# Patient Record
Sex: Female | Born: 1965 | Hispanic: No | Marital: Married | Smoking: Never smoker
Health system: Southern US, Community
[De-identification: ages and names within clinical notes are randomized; demographics above are authoritative.]

## PROBLEM LIST (undated history)

## (undated) DIAGNOSIS — I10 Essential (primary) hypertension: Secondary | ICD-10-CM

## (undated) DIAGNOSIS — N6320 Unspecified lump in the left breast, unspecified quadrant: Secondary | ICD-10-CM

## (undated) DIAGNOSIS — E538 Deficiency of other specified B group vitamins: Secondary | ICD-10-CM

## (undated) DIAGNOSIS — N6092 Unspecified benign mammary dysplasia of left breast: Secondary | ICD-10-CM

## (undated) DIAGNOSIS — E785 Hyperlipidemia, unspecified: Secondary | ICD-10-CM

## (undated) DIAGNOSIS — H409 Unspecified glaucoma: Secondary | ICD-10-CM

## (undated) DIAGNOSIS — M1990 Unspecified osteoarthritis, unspecified site: Secondary | ICD-10-CM

## (undated) DIAGNOSIS — R928 Other abnormal and inconclusive findings on diagnostic imaging of breast: Secondary | ICD-10-CM

## (undated) DIAGNOSIS — K219 Gastro-esophageal reflux disease without esophagitis: Secondary | ICD-10-CM

## (undated) HISTORY — PX: CATARACT EXTRACTION, BILATERAL: SHX1313

## (undated) HISTORY — DX: Unspecified osteoarthritis, unspecified site: M19.90

## (undated) HISTORY — DX: Gastro-esophageal reflux disease without esophagitis: K21.9

## (undated) HISTORY — DX: Unspecified benign mammary dysplasia of left breast: N60.92

## (undated) HISTORY — PX: ABDOMINAL HYSTERECTOMY: SHX81

## (undated) HISTORY — DX: Essential (primary) hypertension: I10

## (undated) HISTORY — DX: Hyperlipidemia, unspecified: E78.5

## (undated) HISTORY — DX: Unspecified glaucoma: H40.9

## (undated) HISTORY — DX: Deficiency of other specified B group vitamins: E53.8

---

## 2015-04-29 ENCOUNTER — Ambulatory Visit: Payer: Self-pay | Attending: Family Medicine

## 2015-06-04 ENCOUNTER — Encounter: Payer: Self-pay | Admitting: Family Medicine

## 2015-06-04 ENCOUNTER — Ambulatory Visit (INDEPENDENT_AMBULATORY_CARE_PROVIDER_SITE_OTHER): Payer: No Typology Code available for payment source | Admitting: Family Medicine

## 2015-06-04 VITALS — BP 124/70 | HR 84 | Temp 98.2°F | Ht 63.0 in | Wt 183.0 lb

## 2015-06-04 DIAGNOSIS — Z1211 Encounter for screening for malignant neoplasm of colon: Secondary | ICD-10-CM

## 2015-06-04 DIAGNOSIS — Z0000 Encounter for general adult medical examination without abnormal findings: Secondary | ICD-10-CM

## 2015-06-04 DIAGNOSIS — I10 Essential (primary) hypertension: Secondary | ICD-10-CM

## 2015-06-04 DIAGNOSIS — H409 Unspecified glaucoma: Secondary | ICD-10-CM

## 2015-06-04 DIAGNOSIS — M25519 Pain in unspecified shoulder: Secondary | ICD-10-CM | POA: Insufficient documentation

## 2015-06-04 DIAGNOSIS — M25511 Pain in right shoulder: Secondary | ICD-10-CM

## 2015-06-04 DIAGNOSIS — Z23 Encounter for immunization: Secondary | ICD-10-CM

## 2015-06-04 LAB — COMPLETE METABOLIC PANEL WITH GFR
ALT: 23 U/L (ref 6–29)
AST: 26 U/L (ref 10–35)
Albumin: 4 g/dL (ref 3.6–5.1)
Alkaline Phosphatase: 85 U/L (ref 33–130)
BUN: 11 mg/dL (ref 7–25)
CHLORIDE: 105 mmol/L (ref 98–110)
CO2: 24 mmol/L (ref 20–31)
Calcium: 9.6 mg/dL (ref 8.6–10.4)
Creat: 0.58 mg/dL (ref 0.50–1.05)
GLUCOSE: 94 mg/dL (ref 65–99)
Potassium: 4.3 mmol/L (ref 3.5–5.3)
SODIUM: 139 mmol/L (ref 135–146)
Total Bilirubin: 0.3 mg/dL (ref 0.2–1.2)
Total Protein: 7 g/dL (ref 6.1–8.1)

## 2015-06-04 LAB — CBC WITH DIFFERENTIAL/PLATELET
BASOS ABS: 0 10*3/uL (ref 0.0–0.1)
Basophils Relative: 0 % (ref 0–1)
Eosinophils Absolute: 0.2 10*3/uL (ref 0.0–0.7)
Eosinophils Relative: 3 % (ref 0–5)
HEMATOCRIT: 37.7 % (ref 36.0–46.0)
HEMOGLOBIN: 12 g/dL (ref 12.0–15.0)
LYMPHS ABS: 1.3 10*3/uL (ref 0.7–4.0)
LYMPHS PCT: 19 % (ref 12–46)
MCH: 26.5 pg (ref 26.0–34.0)
MCHC: 31.8 g/dL (ref 30.0–36.0)
MCV: 83.2 fL (ref 78.0–100.0)
MONOS PCT: 6 % (ref 3–12)
MPV: 11.3 fL (ref 8.6–12.4)
Monocytes Absolute: 0.4 10*3/uL (ref 0.1–1.0)
NEUTROS ABS: 5 10*3/uL (ref 1.7–7.7)
NEUTROS PCT: 72 % (ref 43–77)
PLATELETS: 345 10*3/uL (ref 150–400)
RBC: 4.53 MIL/uL (ref 3.87–5.11)
RDW: 15 % (ref 11.5–15.5)
WBC: 6.9 10*3/uL (ref 4.0–10.5)

## 2015-06-04 LAB — LIPID PANEL
CHOL/HDL RATIO: 4.3 ratio (ref <=5.0)
Cholesterol: 151 mg/dL (ref 125–200)
HDL: 35 mg/dL — AB (ref >=46)
LDL Cholesterol: 93 mg/dL (ref <130)
Triglycerides: 115 mg/dL (ref <150)
VLDL: 23 mg/dL (ref <30)

## 2015-06-04 MED ORDER — OLMESARTAN MEDOXOMIL 20 MG PO TABS: 10.0000 mg | ORAL_TABLET | Freq: Every day | ORAL | Status: DC

## 2015-06-04 MED ORDER — AMLODIPINE BESYLATE 5 MG PO TABS: 2.5000 mg | ORAL_TABLET | Freq: Every day | ORAL | Status: DC

## 2015-06-04 NOTE — Progress Notes (Signed)
Patient ID: Alexandra Beasley, female   DOB: [birth year], 50 y.o.   MRN: [id number]   Alexandra Beasley, is a 50 y.o. female  [id number]  [id number]  DOB - [birth year]  CC:  Chief Complaint  Patient presents with  . Establish Care       HPI: Alexandra Beasley is a 50 y.o. female here to establish care. She has a history of hypertension and is on Benicar 0.5 and amlodipine 5 mg daily. She is needing refills. She has a history of glaucoma and sees an opthalmologist. Her only complaint today is of right upper chest and shoulder pain, which has been bothering her for about a month. She has been taking OTC naproxen occasionally. She is not aware of any specific injury.   Health Maintenance:  She is in need of Tdap and influenza, which she is to receive today. She also needs mammogram and colon cancer screening She has had a hysterectomy about 15 years ago. She is also in need of HIV and Hep C screening.  No Known Allergies Past Medical History  Diagnosis Date  . Hypertension    No current outpatient prescriptions on file prior to visit.   No current facility-administered medications on file prior to visit.   Family History  Problem Relation Age of Onset  . Vision loss Mother   . Hypertension Mother   . Heart disease Father    Social History   Social History  . Marital Status: Married    Spouse Name: N/A  . Number of Children: N/A  . Years of Education: N/A   Occupational History  . Not on file.   Social History Main Topics  . Smoking status: Never Smoker   . Smokeless tobacco: Never Used  . Alcohol Use: No  . Drug Use: No  . Sexual Activity: No   Other Topics Concern  . Not on file   Social History Narrative  . No narrative on file    Review of Systems: Constitutional: Negative for fever, chills, appetite change, weight loss,  Fatigue. Skin: Negative for rashes or lesions of concern. HENT: Negative for ear pain, ear discharge.nose bleeds Eyes: Negative for  pain, discharge, redness, itching and visual disturbance.Glaucoma Neck: Negative for pain, stiffness Respiratory: Negative for cough. Some shortness of breath with stair walking Cardiovascular: Negative for chest pain, palpitations and leg swelling. Gastrointestinal: Negative for abdominal pain, nausea, vomiting, diarrhea, constipations. Positive for some acidity problems Genitourinary: Negative for dysuria, urgency, frequency, hematuria,  Musculoskeletal: Positive for  Low back pain, knee pain and right shoulder pain., joint  swelling, and gait problem.Negative for weakness. Neurological: Negative for dizziness, tremors, seizures, syncope,   light-headedness, numbness and headaches.  Hematological: Negative for easy bruising or bleeding Psychiatric/Behavioral: Negative for depression, anxiety, decreased concentration, confusion   Objective:   Filed Vitals:   06/04/15 1328  BP: 124/70  Pulse: 84  Temp: 98.2 F (36.8 C)    Physical Exam: Constitutional: Patient appears well-developed and well-nourished. No distress. HENT: Normocephalic, atraumatic, External right and left ear normal. Oropharynx is clear and moist.  Eyes: Conjunctivae and EOM are normal. PERRLA, no scleral icterus. Neck: Normal ROM. Neck supple. No lymphadenopathy, No thyromegaly. CVS: RRR, S1/S2 +, no murmurs, no gallops, no rubs Pulmonary: Effort and breath sounds normal, no stridor, rhonchi, wheezes, rales.  Abdominal: Soft. Normoactive BS,, no distension, tenderness, rebound or guarding.  Musculoskeletal: Positive for pain in area of right shoulder with ROM. No edema and no tenderness.  Neuro: Alert.Normal muscle tone coordination.  Non-focal Skin: Skin is warm and dry. No rash noted. Not diaphoretic. No erythema. No pallor. Psychiatric: Normal mood and affect. Behavior, judgment, thought content normal.  No results found for: WBC, HGB, HCT, MCV, PLT No results found for: CREATININE, BUN, NA, K, CL, CO2  No  results found for: HGBA1C Lipid Panel  No results found for: CHOL, TRIG, HDL, CHOLHDL, VLDL, LDLCALC     Assessment and plan:   1. Need for Tdap vaccination  - Tdap vaccine greater than or equal to 7yo IM  2. Need for prophylactic vaccination and inoculation against influenza  - Flu Vaccine QUAD 36+ mos PF IM (Fluarix & Fluzone Quad PF)  3. Colon cancer screening  - POC Hemoccult Bld/Stl (3-Cd Home Screen); Future  4. Essential hypertension  - olmesartan (BENICAR) 20 MG tablet; Take 0.5 tablets (10 mg total) by mouth daily.  Dispense: 90 tablet; Refill: 1 - amLODipine (NORVASC) 5 MG tablet; Take 0.5 tablets (2.5 mg total) by mouth daily.  Dispense: 90 tablet; Refill: 1 - COMPLETE METABOLIC PANEL WITH GFR - CBC with Differential - Lipid panel  5. Health care maintenance  - HIV antibody (with reflex) - Hepatitis C antibody; Standing - MM Digital Screening; Future - Hepatitis C antibody   Return in about 6 months (around 12/05/2015).  The patient was given clear instructions to go to ER or return to medical center if symptoms don't improve, worsen or new problems develop. The patient verbalized understanding.    Micheline Chapman FNP  06/04/2015, 2:12 PM

## 2015-06-04 NOTE — Patient Instructions (Signed)
We will let you know if anything in bloodwork needs attention Call [phone] to schedule a mammogram for breast cancer screening Return stool cards to [place] to be screened for colon cancer.

## 2015-06-05 LAB — HIV ANTIBODY (ROUTINE TESTING W REFLEX): HIV: NONREACTIVE

## 2015-06-08 ENCOUNTER — Other Ambulatory Visit: Payer: Self-pay | Admitting: Family Medicine

## 2015-06-08 ENCOUNTER — Other Ambulatory Visit (INDEPENDENT_AMBULATORY_CARE_PROVIDER_SITE_OTHER): Payer: No Typology Code available for payment source

## 2015-06-08 DIAGNOSIS — Z1231 Encounter for screening mammogram for malignant neoplasm of breast: Secondary | ICD-10-CM

## 2015-06-08 DIAGNOSIS — Z1211 Encounter for screening for malignant neoplasm of colon: Secondary | ICD-10-CM

## 2015-06-08 LAB — POC HEMOCCULT BLD/STL (HOME/3-CARD/SCREEN)
Card #2 Fecal Occult Blod, POC: NEGATIVE
FECAL OCCULT BLD: NEGATIVE
FECAL OCCULT BLD: NEGATIVE

## 2015-06-09 ENCOUNTER — Ambulatory Visit
Admission: RE | Admit: 2015-06-09 | Discharge: 2015-06-09 | Disposition: A | Discharge status: Home / Self Care | Payer: No Typology Code available for payment source | Source: Ambulatory Visit | Attending: Family Medicine | Admitting: Family Medicine

## 2015-06-09 DIAGNOSIS — Z1231 Encounter for screening mammogram for malignant neoplasm of breast: Secondary | ICD-10-CM

## 2015-06-17 ENCOUNTER — Telehealth: Payer: Self-pay

## 2015-06-17 NOTE — Telephone Encounter (Signed)
Vaughan Basta can you please advise about medications, pharmacy is saying medications can not be split. Can you prescribe a different dose or medication? Please have your nurse give patient a call back on Friday. Thanks!

## 2015-06-22 NOTE — Telephone Encounter (Signed)
Alexandra Beasley will you please have Vaughan Basta address by the end of the day. Thanks!

## 2015-06-22 NOTE — Telephone Encounter (Signed)
I spoke at length with patients daughter, they have 3 issues, the first is both B/P meds need dose adjustments. The current dose of Benicar 20mg  1/2 tablet and Norvasc 5mg  1/2 tablet were given in [place]. The pharmacy here says they can not cut either of these tablets, so they need you to decide what dose of each she needs daily. Once decided it needs to be sent to [facility]. Patient also needs referral to Opthalmology due to dx of glaucoma. She only has the orange card for insurance purposes. Thanks!

## 2015-06-25 ENCOUNTER — Other Ambulatory Visit: Payer: Self-pay | Admitting: Family Medicine

## 2015-06-25 DIAGNOSIS — H409 Unspecified glaucoma: Secondary | ICD-10-CM

## 2015-06-25 NOTE — Telephone Encounter (Signed)
Called and spoke with Son ( received permission from mom since she doesn't speak english well) Advised him to have her STOP benicar and only take 1 tablet of amlodipine daily. He verbalized understanding and and said his mom would like to call back after she finds out her schedule to schedule a bp check with [place]. Thanks!

## 2015-06-25 NOTE — Telephone Encounter (Signed)
I would recommend stopping the benicar and taking the whole 5 mg of amlodipine and have BP in two weeks.

## 2015-06-26 ENCOUNTER — Ambulatory Visit: Payer: No Typology Code available for payment source

## 2015-06-26 ENCOUNTER — Other Ambulatory Visit: Payer: Self-pay

## 2015-06-26 DIAGNOSIS — I10 Essential (primary) hypertension: Secondary | ICD-10-CM

## 2015-06-26 MED ORDER — AMLODIPINE BESYLATE 5 MG PO TABS: 2.5000 mg | ORAL_TABLET | Freq: Every day | ORAL | Status: DC

## 2015-06-26 MED ORDER — OLMESARTAN MEDOXOMIL 20 MG PO TABS: 10.0000 mg | ORAL_TABLET | Freq: Every day | ORAL | Status: DC

## 2015-06-26 NOTE — Progress Notes (Unsigned)
Patient ID: Alexandra Beasley, female   DOB: 12-25-1965, 50 y.o.   MRN: [id number] Pt arrived for BP check; BP 127/67; NP notified; pt to continue home regimen as prescribed

## 2015-06-29 ENCOUNTER — Other Ambulatory Visit: Payer: Self-pay | Admitting: Family Medicine

## 2015-06-29 MED ORDER — LISINOPRIL-HYDROCHLOROTHIAZIDE 20-25 MG PO TABS: 1.0000 | ORAL_TABLET | Freq: Every day | ORAL | Status: DC

## 2015-06-29 MED FILL — LISINOPRIL-HCTZ 20-25 MG TA: 20-25 | 30 days supply | Qty: 30 | Fill #0

## 2015-07-13 ENCOUNTER — Other Ambulatory Visit: Payer: Self-pay | Admitting: Family Medicine

## 2015-07-13 ENCOUNTER — Ambulatory Visit: Payer: No Typology Code available for payment source | Admitting: Family Medicine

## 2015-07-13 DIAGNOSIS — H409 Unspecified glaucoma: Secondary | ICD-10-CM

## 2015-07-27 MED FILL — LISINOPRIL-HCTZ 20-25 MG TA: 20-25 | 30 days supply | Qty: 30 | Fill #1

## 2015-10-28 ENCOUNTER — Ambulatory Visit: Payer: No Typology Code available for payment source | Attending: Internal Medicine

## 2015-12-01 ENCOUNTER — Ambulatory Visit: Payer: No Typology Code available for payment source | Admitting: Family Medicine

## 2015-12-11 ENCOUNTER — Ambulatory Visit: Payer: No Typology Code available for payment source | Admitting: Family Medicine

## 2015-12-28 ENCOUNTER — Ambulatory Visit (INDEPENDENT_AMBULATORY_CARE_PROVIDER_SITE_OTHER): Payer: No Typology Code available for payment source | Admitting: Family Medicine

## 2015-12-28 ENCOUNTER — Encounter: Payer: Self-pay | Admitting: Family Medicine

## 2015-12-28 VITALS — BP 124/71 | HR 80 | Temp 98.3°F | Resp 12 | Ht 62.0 in | Wt 182.0 lb

## 2015-12-28 DIAGNOSIS — I10 Essential (primary) hypertension: Secondary | ICD-10-CM

## 2015-12-28 DIAGNOSIS — H409 Unspecified glaucoma: Secondary | ICD-10-CM

## 2015-12-28 DIAGNOSIS — Z23 Encounter for immunization: Secondary | ICD-10-CM

## 2015-12-28 DIAGNOSIS — S025XXA Fracture of tooth (traumatic), initial encounter for closed fracture: Secondary | ICD-10-CM

## 2015-12-28 DIAGNOSIS — Z131 Encounter for screening for diabetes mellitus: Secondary | ICD-10-CM

## 2015-12-28 LAB — CBC WITH DIFFERENTIAL/PLATELET
BASOS PCT: 1 %
Basophils Absolute: 66 cells/uL (ref 0–200)
EOS ABS: 132 {cells}/uL (ref 15–500)
Eosinophils Relative: 2 %
HCT: 36.2 % (ref 35.0–45.0)
Hemoglobin: 11.6 g/dL — ABNORMAL LOW (ref 11.7–15.5)
Lymphocytes Relative: 20 %
Lymphs Abs: 1320 cells/uL (ref 850–3900)
MCH: 26.5 pg — ABNORMAL LOW (ref 27.0–33.0)
MCHC: 32 g/dL (ref 32.0–36.0)
MCV: 82.6 fL (ref 80.0–100.0)
MONO ABS: 396 {cells}/uL (ref 200–950)
MONOS PCT: 6 %
MPV: 10.2 fL (ref 7.5–12.5)
NEUTROS ABS: 4686 {cells}/uL (ref 1500–7800)
Neutrophils Relative %: 71 %
PLATELETS: 298 10*3/uL (ref 140–400)
RBC: 4.38 MIL/uL (ref 3.80–5.10)
RDW: 14.4 % (ref 11.0–15.0)
WBC: 6.6 10*3/uL (ref 3.8–10.8)

## 2015-12-28 LAB — COMPLETE METABOLIC PANEL WITH GFR
ALT: 24 U/L (ref 6–29)
AST: 23 U/L (ref 10–35)
Albumin: 3.8 g/dL (ref 3.6–5.1)
Alkaline Phosphatase: 75 U/L (ref 33–130)
BILIRUBIN TOTAL: 0.4 mg/dL (ref 0.2–1.2)
BUN: 11 mg/dL (ref 7–25)
CO2: 21 mmol/L (ref 20–31)
CREATININE: 0.6 mg/dL (ref 0.50–1.05)
Calcium: 9.4 mg/dL (ref 8.6–10.4)
Chloride: 109 mmol/L (ref 98–110)
GFR, Est Non African American: >89 mL/min (ref >=60)
GLUCOSE: 103 mg/dL — AB (ref 65–99)
Potassium: 4.1 mmol/L (ref 3.5–5.3)
Sodium: 137 mmol/L (ref 135–146)
TOTAL PROTEIN: 6.8 g/dL (ref 6.1–8.1)

## 2015-12-28 LAB — LIPID PANEL
Cholesterol: 140 mg/dL (ref 125–200)
HDL: 30 mg/dL — ABNORMAL LOW (ref >=46)
LDL CALC: 88 mg/dL (ref <130)
TRIGLYCERIDES: 112 mg/dL (ref <150)
Total CHOL/HDL Ratio: 4.7 Ratio (ref <=5.0)
VLDL: 22 mg/dL (ref <30)

## 2015-12-28 MED ORDER — LISINOPRIL-HYDROCHLOROTHIAZIDE 20-25 MG PO TABS
1.0000 | ORAL_TABLET | Freq: Every day | ORAL | 1 refills | Status: DC
Start: 2015-12-28 — End: 2017-05-12

## 2015-12-28 NOTE — Progress Notes (Signed)
Alexandra Beasley, is a 50 y.o. female  [id number]  [id number]  DOB - 01-08-1966  CC:  Chief Complaint  Patient presents with  . follow up    needs eye referral for glaucoma follow up  . Dental Pain    has broken tooth, would like referral  . Shoulder Pain    left upper back and shoulder pain, comes and goes, doesnt know why       HPI: Alexandra Beasley is a 50 y.o. female here for follow-up hypertension. She is requesting a dental referral for a broken tooth and a referral to opth.for glaucoma. She also complains of left upper back and shoulder pain that comes a goes. She reports taking no medications for this. Otherwise she reports feeling well.  Health Maintenanace: She is in need of a PAP smear. She has had a recent mammogram. She will receive flu shot today. She has has recent negative stool cards.  No Known Allergies Past Medical History:  Diagnosis Date  . Glaucoma   . Hypertension    Current Outpatient Prescriptions on File Prior to Visit  Medication Sig Dispense Refill  . latanoprost (XALATAN) 0.005 % ophthalmic solution Place 1 drop into both eyes daily.     No current facility-administered medications on file prior to visit.    Family History  Problem Relation Age of Onset  . Vision loss Mother   . Hypertension Mother   . Heart disease Father    Social History   Social History  . Marital status: Married    Spouse name: N/A  . Number of children: N/A  . Years of education: N/A   Occupational History  . Not on file.   Social History Main Topics  . Smoking status: Never Smoker  . Smokeless tobacco: Never Used  . Alcohol use No  . Drug use: No  . Sexual activity: No   Other Topics Concern  . Not on file   Social History Narrative  . No narrative on file    Review of Systems: Constitutional: Negative Skin: Negative HENT: + for dental issues Eyes: + for glaucoma Neck: Negative Respiratory: Negative Cardiovascular:  Negative Gastrointestinal: Negative Genitourinary: Negative  Musculoskeletal: + for left upper back and shoulder pain and for bilateral knee pain Neurological: Negative  Hematological: Negative  Psychiatric/Behavioral: Negative    Objective:   Vitals:   12/28/15 1015  BP: 124/71  Pulse: 80  Resp: 12  Temp: 98.3 F (36.8 C)    Physical Exam: Constitutional: Patient appears well-developed and well-nourished. No distress. HENT: Normocephalic, atraumatic, External right and left ear normal. Oropharynx is clear and moist. Teeth in poor repair Eyes: Conjunctivae and EOM are normal. PERRLA, no scleral icterus. Neck: Normal ROM. Neck supple. No lymphadenopathy, No thyromegaly. CVS: RRR, S1/S2 +, no murmurs, no gallops, no rubs Pulmonary: Effort and breath sounds normal, no stridor, rhonchi, wheezes, rales.  Abdominal: Soft. Normoactive BS,, no distension, tenderness, rebound or guarding.  Musculoskeletal: Normal range of motion. No edema and no tenderness.  Neuro: Alert.Normal muscle tone coordination. Non-focal Skin: Skin is warm and dry. No rash noted. Not diaphoretic. No erythema. No pallor. Psychiatric: Normal mood and affect. Behavior, judgment, thought content normal.  Lab Results  Component Value Date   WBC 6.9 06/04/2015   HGB 12.0 06/04/2015   HCT 37.7 06/04/2015   MCV 83.2 06/04/2015   PLT 345 06/04/2015   Lab Results  Component Value Date   CREATININE 0.58 06/04/2015   BUN 11 06/04/2015  NA 139 06/04/2015   K 4.3 06/04/2015   CL 105 06/04/2015   CO2 24 06/04/2015    No results found for: HGBA1C Lipid Panel     Component Value Date/Time   CHOL 151 06/04/2015 1413   TRIG 115 06/04/2015 1413   HDL 35 (L) 06/04/2015 1413   CHOLHDL 4.3 06/04/2015 1413   VLDL 23 06/04/2015 1413   LDLCALC 93 06/04/2015 1413       Assessment and plan:   1. Screening for diabetes mellitus  - Hemoglobin A1c  2. Essential hypertension  - CBC with Differential -  COMPLETE METABOLIC PANEL WITH GFR - Lipid panel  3. Glaucoma, unspecified glaucoma type, unspecified laterality  - Ambulatory referral to Ophthalmology  4. Closed fracture of tooth, initial encounter  - Ambulatory referral to Dentistry  5. Need for prophylactic vaccination and inoculation against influenza  - Flu Vaccine QUAD 36+ mos PF IM (Fluarix & Fluzone Quad PF)   Return in about 6 months (around 06/27/2016).  The patient was given clear instructions to go to ER or return to medical center if symptoms don't improve, worsen or new problems develop. The patient verbalized understanding.    Micheline Chapman FNP  12/28/2015, 12:06 PM

## 2015-12-28 NOTE — Patient Instructions (Addendum)
Continue current medications May take over the counter pain medications. Have put in referrals for eye doctor and dentist

## 2015-12-29 LAB — HEMOGLOBIN A1C
HEMOGLOBIN A1C: 5.8 % — AB (ref <5.7)
MEAN PLASMA GLUCOSE: 120 mg/dL

## 2016-02-01 ENCOUNTER — Emergency Department (HOSPITAL_COMMUNITY): Payer: Worker's Compensation

## 2016-02-01 ENCOUNTER — Encounter (HOSPITAL_COMMUNITY): Payer: Self-pay | Admitting: *Deleted

## 2016-02-01 ENCOUNTER — Emergency Department (HOSPITAL_COMMUNITY)
Admission: EM | Admit: 2016-02-01 | Discharge: 2016-02-01 | Disposition: A | Discharge status: Home / Self Care | Payer: Worker's Compensation | Attending: Emergency Medicine | Admitting: Emergency Medicine

## 2016-02-01 DIAGNOSIS — Y92000 Kitchen of unspecified non-institutional (private) residence as  the place of occurrence of the external cause: Secondary | ICD-10-CM | POA: Diagnosis not present

## 2016-02-01 DIAGNOSIS — W19XXXA Unspecified fall, initial encounter: Secondary | ICD-10-CM

## 2016-02-01 DIAGNOSIS — M1711 Unilateral primary osteoarthritis, right knee: Secondary | ICD-10-CM | POA: Insufficient documentation

## 2016-02-01 DIAGNOSIS — Y990 Civilian activity done for income or pay: Secondary | ICD-10-CM | POA: Diagnosis not present

## 2016-02-01 DIAGNOSIS — Y939 Activity, unspecified: Secondary | ICD-10-CM | POA: Diagnosis not present

## 2016-02-01 DIAGNOSIS — S8391XA Sprain of unspecified site of right knee, initial encounter: Secondary | ICD-10-CM | POA: Diagnosis not present

## 2016-02-01 DIAGNOSIS — W010XXA Fall on same level from slipping, tripping and stumbling without subsequent striking against object, initial encounter: Secondary | ICD-10-CM | POA: Insufficient documentation

## 2016-02-01 DIAGNOSIS — S60222A Contusion of left hand, initial encounter: Secondary | ICD-10-CM | POA: Diagnosis not present

## 2016-02-01 DIAGNOSIS — I10 Essential (primary) hypertension: Secondary | ICD-10-CM | POA: Diagnosis not present

## 2016-02-01 DIAGNOSIS — S6992XA Unspecified injury of left wrist, hand and finger(s), initial encounter: Secondary | ICD-10-CM | POA: Diagnosis present

## 2016-02-01 MED ORDER — HYDROCODONE-ACETAMINOPHEN 5-325 MG PO TABS: 1.0000 | ORAL_TABLET | Freq: Four times a day (QID) | ORAL | 0 refills | Status: DC | PRN

## 2016-02-01 MED ORDER — OXYCODONE-ACETAMINOPHEN 5-325 MG PO TABS
1.0000 | ORAL_TABLET | ORAL | Status: DC | PRN
  Administered 2016-02-01: 1 via ORAL
  Filled 2016-02-01: qty 1

## 2016-02-01 MED ORDER — NAPROXEN 500 MG PO TABS: 500.0000 mg | ORAL_TABLET | Freq: Two times a day (BID) | ORAL | 0 refills | Status: DC | PRN

## 2016-02-01 NOTE — ED Provider Notes (Signed)
[facility] DEPT Provider Note   CSN: [id number] Arrival date & time: 02/01/16  1637  By signing my name below, I, Alexandra Beasley, attest that this documentation has been prepared under the direction and in the presence of Sharayah Renfrow Camprubi-Soms, PA-C. Electronically Signed: Rayna Beasley, ED Scribe. 02/01/16. 5:28 PM.   History   Chief Complaint Chief Complaint  Patient presents with  . Wrist Pain  . Knee Pain    HPI HPI Comments: Alexandra Beasley is a 50 y.o. female who presents to the Emergency Department complaining of a mechanical fall that occurred PTA. Pt slipped on a wet kitchen surface at her work and landed on her left wrist and right knee. She reports associated 9/10, constant, throbbing, non radiating right knee and left hand pain that worsens with weight bearing and gripping her L hand, and with no tx tried PTA. Associated symptoms includes mild swelling in R knee, and bruising to R knee that has since improved/resolved. She was unable to ambulate after the fall without significant discomfort, causing her to limp on the R leg due to pain. She denies head trauma, LOC, any other regions of pain, other injuries sustained, focal weakness, numbness, or tingling. No abrasions/lacerations.   The history is provided by the patient and a relative. No language interpreter was used.  Wrist Pain  This is a new problem. The current episode started 1 to 2 hours ago. The problem occurs constantly. The problem has not changed since onset.The symptoms are aggravated by walking, bending and twisting. Nothing relieves the symptoms. She has tried nothing for the symptoms. The treatment provided no relief.  Knee Pain   Pertinent negatives include no numbness.    Past Medical History:  Diagnosis Date  . Glaucoma   . Hypertension     Patient Active Problem List   Diagnosis Date Noted  . Glaucoma 06/04/2015  . Pain in joint, shoulder region 06/04/2015    Past Surgical History:    Procedure Laterality Date  . ABDOMINAL HYSTERECTOMY      OB History    No data available     Home Medications    Prior to Admission medications   Medication Sig Start Date End Date Taking? Authorizing Provider  latanoprost (XALATAN) 0.005 % ophthalmic solution Place 1 drop into both eyes daily.    Historical Provider, MD  lisinopril-hydrochlorothiazide (PRINZIDE,ZESTORETIC) 20-25 MG tablet Take 1 tablet by mouth daily. 12/28/15   Micheline Chapman, NP    Family History Family History  Problem Relation Age of Onset  . Vision loss Mother   . Hypertension Mother   . Heart disease Father     Social History Social History  Substance Use Topics  . Smoking status: Never Smoker  . Smokeless tobacco: Never Used  . Alcohol use No     Allergies   Patient has no known allergies.  Review of Systems Review of Systems  HENT: Negative for facial swelling (no head inj).   Musculoskeletal: Positive for arthralgias (R knee, L hand) and joint swelling (R knee).  Skin: Positive for color change (bruise earlier on R knee, resolved). Negative for wound.  Allergic/Immunologic: Negative for immunocompromised state.  Neurological: Negative for syncope, weakness and numbness.  Psychiatric/Behavioral: Negative for confusion.  10 Systems reviewed and are negative for acute change except as noted in the HPI.   Physical Exam Updated Vital Signs BP 126/69 (BP Location: Right Arm)   Pulse 91   Temp 98.4 F (36.9 C) (Oral)   Resp 16  SpO2 98%   Physical Exam  Constitutional: She is oriented to person, place, and time. Vital signs are normal. She appears well-developed and well-nourished.  Non-toxic appearance. No distress.  Afebrile, nontoxic, NAD  HENT:  Head: Normocephalic and atraumatic.  Mouth/Throat: Mucous membranes are normal.  Eyes: Conjunctivae and EOM are normal. Right eye exhibits no discharge. Left eye exhibits no discharge.  Neck: Normal range of motion. Neck supple.   Cardiovascular: Normal rate and intact distal pulses.   Pulmonary/Chest: Effort normal. No respiratory distress.  Abdominal: Normal appearance. She exhibits no distension.  Musculoskeletal:       Right knee: She exhibits decreased range of motion (due to pain), swelling and effusion. She exhibits no ecchymosis, no deformity, no laceration, no erythema, normal alignment, no LCL laxity, normal patellar mobility and no MCL laxity. Tenderness found. Medial joint line and lateral joint line tenderness noted.       Left hand: She exhibits tenderness, bony tenderness and swelling. She exhibits normal range of motion, normal capillary refill, no deformity and no laceration. Normal sensation noted. Normal strength noted.  Right knee with mildly limited ROM due to pain, with diffuse joint line TTP but no calf or thigh tenderness, +swelling/effusion no deformity, no bruising or erythema, no warmth, no abnormal alignment or patellar mobility, no varus/valgus laxity, neg anterior drawer test, no crepitus.  Left hand with mild TTP to the second metacarpal region, FROM intact in all digits and at wrist, no focal TTP at the wrist, with mild swelling near the second MCP joint, skin intact, no bruising, no deformity or crepitus.  Strength and sensation grossly intact in all extremities, distal pulses intact, compartments soft   Neurological: She is alert and oriented to person, place, and time. She has normal strength. No sensory deficit.  Skin: Skin is warm, dry and intact. No rash noted.  Psychiatric: She has a normal mood and affect. Her behavior is normal.  Nursing note and vitals reviewed.  ED Treatments / Results  Labs (all labs ordered are listed, but only abnormal results are displayed) Labs Reviewed - No data to display  EKG  EKG Interpretation None       Radiology Dg Wrist Complete Left  Result Date: 02/01/2016 CLINICAL DATA:  LEFT wrist pain all over since falling at work today EXAM: LEFT  WRIST - COMPLETE 3+ VIEW COMPARISON:  None FINDINGS: Osseous mineralization normal. Joint spaces preserved. No fracture, dislocation, or bone destruction. IMPRESSION: Normal exam. Electronically Signed   By: Lavonia Dana M.D.   On: 02/01/2016 17:25   Dg Knee Complete 4 Views Right  Result Date: 02/01/2016 CLINICAL DATA:  50 year old female with knee pain after falling at work. Initial encounter. EXAM: RIGHT KNEE - COMPLETE 4+ VIEW COMPARISON:  None. FINDINGS: Lateral and patellofemoral subchondral mixed lucency and sclerosis, and degenerative appearing osteophytosis. The medial compartment has a more normal appearance, aside from mild degenerative spurring. The patella appears intact. No joint effusion identified. No acute osseous abnormality identified. IMPRESSION: No acute fracture or dislocation identified about the right knee. Age advanced lateral and patellofemoral compartment degeneration. Electronically Signed   By: Genevie Ann M.D.   On: 02/01/2016 17:25   Dg Hand Complete Left  Result Date: 02/01/2016 CLINICAL DATA:  Left hand pain.  Left second metacarpal pain. EXAM: LEFT HAND - COMPLETE 3+ VIEW COMPARISON:  Left wrist radiograph same day FINDINGS: There is no evidence of fracture or dislocation. There is no evidence of arthropathy or other focal bone abnormality. Soft  tissues are unremarkable. IMPRESSION: No acute fracture or dislocation of the left hand. Electronically Signed   By: Ulyses Jarred M.D.   On: 02/01/2016 17:47    Procedures Procedures  DIAGNOSTIC STUDIES: Oxygen Saturation is 98% on RA, normal by my interpretation.    COORDINATION OF CARE: 5:27 PM Discussed next steps with pt. Pt verbalized understanding and is agreeable with the plan.    Medications Ordered in ED Medications  oxyCODONE-acetaminophen (PERCOCET/ROXICET) 5-325 MG per tablet 1 tablet (1 tablet Oral Given 02/01/16 1718)   Initial Impression / Assessment and Plan / ED Course  I have reviewed the triage vital  signs and the nursing notes.  Pertinent labs & imaging results that were available during my care of the patient were reviewed by me and considered in my medical decision making (see chart for details).  Clinical Course     50 y.o. female here with L hand and R knee injury s/p slip and fall at work. R knee with loss of ROM due to pain, moderate effusion/swelling, moderate TTP throughout joint, no bruising or crepitus; L hand with mild TTP to 2nd MCP joint area, FROM intact, strength grossly intact; NVI with soft compartments in all extremities. No other injuries sustained. Pain meds given in triage, xray of L wrist done but this doesn't include area of tenderness so will get L hand xray. Awaiting R knee xray, but may need to get CT to r/o tibial plateau fx given how tender she is. Will see what xray says, if negative and unable to ambulate pt, will need to get CT. Will reassess shortly  6:11 PM XRays all negative aside from advanced degenerative changes of R knee joint. Pt ambulated without difficulty, so less likely to be occult tibial plateau fx. Doubt need for emergent CT imaging at this time, especially given the arthritis seen on xray-- that's likely why her pain and swelling was more notable. Will apply knee sleeve, give wrist splint, and crutches for comfort. F/up with ortho before she leaves the country in 1wk, for ongoing eval and management of knee arthritis and recheck of today's symptoms. RICE discussed, pain meds given. I explained the diagnosis and have given explicit precautions to return to the ER including for any other new or worsening symptoms. The patient understands and accepts the medical plan as it's been dictated and I have answered their questions. Discharge instructions concerning home care and prescriptions have been given. The patient is STABLE and is discharged to home in good condition.   I personally performed the services described in this documentation, which was scribed  in my presence. The recorded information has been reviewed and is accurate.   Final Clinical Impressions(s) / ED Diagnoses   Final diagnoses:  Contusion of left hand, initial encounter  Sprain of right knee, unspecified ligament, initial encounter  Osteoarthritis of right knee, unspecified osteoarthritis type  Fall, initial encounter    New Prescriptions New Prescriptions   HYDROCODONE-ACETAMINOPHEN (NORCO) 5-325 MG TABLET    Take 1 tablet by mouth every 6 (six) hours as needed for severe pain.   NAPROXEN (NAPROSYN) 500 MG TABLET    Take 1 tablet (500 mg total) by mouth 2 (two) times daily as needed for mild pain, moderate pain or headache (TAKE WITH MEALS.).     Jerritt Cardoza Camprubi-Soms, PA-C 02/01/16 [place], MD 02/04/16 [phone]

## 2016-02-01 NOTE — ED Triage Notes (Signed)
Pt complains of left wrist and right knee pain since falling at work today. Pt states she slipped on the wet floor and landed on her knees and arms.

## 2016-02-01 NOTE — Discharge Instructions (Signed)
Wear knee sleeve for at least 2 weeks for stabilization of knee. Use crutches as needed for comfort. Use wrist splint as needed for comfort. Ice and elevate knee and hand throughout the day, using ice pack for no more than 20 minutes every hour. Alternate between naprosyn and norco for pain relief. Do not drive or operate machinery with pain medication use. Call orthopedic follow up today or tomorrow to schedule followup appointment for recheck of ongoing pain in 3-5 days, and for ongoing management of your knee arthritis. Return to the ER for changes or worsening symptoms.

## 2016-03-21 HISTORY — PX: BREAST EXCISIONAL BIOPSY: SUR124

## 2016-05-09 ENCOUNTER — Ambulatory Visit: Payer: Self-pay

## 2016-05-11 ENCOUNTER — Ambulatory Visit: Payer: Self-pay | Attending: Internal Medicine

## 2016-06-08 ENCOUNTER — Other Ambulatory Visit: Payer: Self-pay | Admitting: Family Medicine

## 2016-06-08 DIAGNOSIS — Z1231 Encounter for screening mammogram for malignant neoplasm of breast: Secondary | ICD-10-CM

## 2016-06-27 ENCOUNTER — Ambulatory Visit
Admission: RE | Admit: 2016-06-27 | Discharge: 2016-06-27 | Disposition: A | Discharge status: Home / Self Care | Payer: No Typology Code available for payment source | Source: Ambulatory Visit | Attending: Family Medicine | Admitting: Family Medicine

## 2016-06-27 DIAGNOSIS — Z1231 Encounter for screening mammogram for malignant neoplasm of breast: Secondary | ICD-10-CM

## 2016-06-29 ENCOUNTER — Other Ambulatory Visit: Payer: Self-pay | Admitting: Obstetrics and Gynecology

## 2016-06-29 DIAGNOSIS — R928 Other abnormal and inconclusive findings on diagnostic imaging of breast: Secondary | ICD-10-CM

## 2016-07-04 ENCOUNTER — Ambulatory Visit (INDEPENDENT_AMBULATORY_CARE_PROVIDER_SITE_OTHER): Payer: Self-pay | Admitting: Family Medicine

## 2016-07-04 ENCOUNTER — Encounter: Payer: Self-pay | Admitting: Family Medicine

## 2016-07-04 VITALS — BP 134/70 | HR 74 | Temp 98.1°F | Resp 16 | Ht 62.0 in | Wt 182.0 lb

## 2016-07-04 DIAGNOSIS — Z1329 Encounter for screening for other suspected endocrine disorder: Secondary | ICD-10-CM

## 2016-07-04 DIAGNOSIS — R7303 Prediabetes: Secondary | ICD-10-CM

## 2016-07-04 DIAGNOSIS — R8290 Unspecified abnormal findings in urine: Secondary | ICD-10-CM

## 2016-07-04 DIAGNOSIS — H539 Unspecified visual disturbance: Secondary | ICD-10-CM

## 2016-07-04 DIAGNOSIS — Z1211 Encounter for screening for malignant neoplasm of colon: Secondary | ICD-10-CM

## 2016-07-04 DIAGNOSIS — G5602 Carpal tunnel syndrome, left upper limb: Secondary | ICD-10-CM

## 2016-07-04 LAB — COMPLETE METABOLIC PANEL WITH GFR
ALBUMIN: 3.8 g/dL (ref 3.6–5.1)
ALK PHOS: 77 U/L (ref 33–130)
ALT: 21 U/L (ref 6–29)
AST: 25 U/L (ref 10–35)
BUN: 11 mg/dL (ref 7–25)
CHLORIDE: 107 mmol/L (ref 98–110)
CO2: 24 mmol/L (ref 20–31)
Calcium: 9.3 mg/dL (ref 8.6–10.4)
Creat: 0.47 mg/dL — ABNORMAL LOW (ref 0.50–1.05)
GFR, Est African American: >89 mL/min (ref >=60)
GFR, Est Non African American: >89 mL/min (ref >=60)
GLUCOSE: 103 mg/dL — AB (ref 65–99)
POTASSIUM: 4.2 mmol/L (ref 3.5–5.3)
SODIUM: 139 mmol/L (ref 135–146)
Total Bilirubin: 0.3 mg/dL (ref 0.2–1.2)
Total Protein: 6.8 g/dL (ref 6.1–8.1)

## 2016-07-04 LAB — POCT URINALYSIS DIP (DEVICE)
BILIRUBIN URINE: NEGATIVE
Glucose, UA: NEGATIVE mg/dL
HGB URINE DIPSTICK: NEGATIVE
KETONES UR: NEGATIVE mg/dL
LEUKOCYTES UA: NEGATIVE
NITRITE: NEGATIVE
PH: 6 (ref 5.0–8.0)
Protein, ur: NEGATIVE mg/dL
Specific Gravity, Urine: 1.025 (ref 1.005–1.030)
Urobilinogen, UA: 0.2 mg/dL (ref 0.0–1.0)

## 2016-07-04 LAB — CBC WITH DIFFERENTIAL/PLATELET
BASOS ABS: 0 {cells}/uL (ref 0–200)
Basophils Relative: 0 %
EOS ABS: 189 {cells}/uL (ref 15–500)
Eosinophils Relative: 3 %
HEMATOCRIT: 35.9 % (ref 35.0–45.0)
HEMOGLOBIN: 11.4 g/dL — AB (ref 11.7–15.5)
LYMPHS ABS: 1323 {cells}/uL (ref 850–3900)
Lymphocytes Relative: 21 %
MCH: 26.1 pg — ABNORMAL LOW (ref 27.0–33.0)
MCHC: 31.8 g/dL — ABNORMAL LOW (ref 32.0–36.0)
MCV: 82.3 fL (ref 80.0–100.0)
MONO ABS: 378 {cells}/uL (ref 200–950)
MPV: 11.4 fL (ref 7.5–12.5)
Monocytes Relative: 6 %
NEUTROS ABS: 4410 {cells}/uL (ref 1500–7800)
Neutrophils Relative %: 70 %
Platelets: 306 10*3/uL (ref 140–400)
RBC: 4.36 MIL/uL (ref 3.80–5.10)
RDW: 15.2 % — ABNORMAL HIGH (ref 11.0–15.0)
WBC: 6.3 10*3/uL (ref 3.8–10.8)

## 2016-07-04 MED ORDER — GABAPENTIN 300 MG PO CAPS: 300.0000 mg | ORAL_CAPSULE | Freq: Two times a day (BID) | ORAL | 0 refills | Status: DC | PRN

## 2016-07-04 MED ORDER — IBUPROFEN 600 MG PO TABS: 600.0000 mg | ORAL_TABLET | Freq: Three times a day (TID) | ORAL | 0 refills | Status: DC | PRN

## 2016-07-04 MED FILL — IBUPROFEN 600 MG TABLET: 600 | 10 days supply | Qty: 30 | Fill #0

## 2016-07-04 MED FILL — GABAPENTIN 300 MG CAPSULE: 300 | 30 days supply | Qty: 60 | Fill #0

## 2016-07-04 NOTE — Patient Instructions (Signed)
For shoulder and wrist pain , this likely a result of carpal tunnel syndrome  Take Ibuprofen 600 mg, twice daily as needed with food.  For pain, take Gabapentin 300 mg twice daily as needed for shoulder pain.   Go to [facility] and purchase a wrist splint and wear every night to prevent pain associated with carpal tunnel.    ???? ?? ???? ?? ???? ?? ???, ?? ?????? ?????? ??? ???????? ?? ?????? ??  ??????????? 600 ????????? ???, ?? ??? ???? ???? ?? ??? ????? ???  ???? ?? ???, ???? ?? ???? ?? ??? ????? ????? ??? ?? ????? ??? ?? ??? 300 ??????????? ????  ???????? ?? ???? ?? ???? ?? ?????? ?? ?????? ?? ?? ??? ?????, ?? ?????? ??? ?? ????? ???? ?? ????? ?? ??? ???  kandhe aur kalaee ke dard ke lie, yah sambhavatah kaarpal tanal sindrom ka parinaam hai  aaeebooprophen 600 mileegraam len, do baar roz bhojan ke saath jarooree ho.  dard ke lie, kandhe ke dard ke lie jarooree dainik roop se prati din do baar 300 gaigaipentin len.  volamaart par [employer] aur kalaee ke tukade ko [place] aur har raat pahanen, jo kaarpal tanal se jude dard ko rokane ke lie hai.    Wrist Splint A wrist splint holds your wrist in a set position so that it does not move. A wrist splint supports your wrist but is flexible. It can be removed or loosened. You may need a wrist splint if you hurt your wrist or have swelling in your wrist. A wrist splint can:  Support your wrist.  Protect a wrist injury.  Prevent another wrist injury.  Keep your wrist from moving.  Reduce pain.  Help your wrist heal. What are the risks? If you wear your splint too tight or have a lot of swelling, it can reduce the blood supply to your wrist or hand. This can cause a condition called compartment syndrome. It can be dangerous and cause lasting damage. Symptoms are:  Pain in your wrist that is getting worse.  Tingling and numbness.  Changes in skin color (paleness or a bluish color).  Cold fingers. Other risks of wearing a  splint may be:  Skin irritation that can cause:  Itching.  Rash.  Skin sores.  Skin infection.  Wrist stiffness. This can happen if you have worn a splint for a long time. How to use your wrist splint Your wrist splint should be tight enough to support your wrist. It should not cut off your blood supply. Your doctor will tell you how to wear your wrist splint and for how long.  Follow all your doctor's directions.  Take medicine only as told by your doctor.  Keep your wrist above the level of your heart (elevated) while resting.  Ice may help reduce pain and swelling.  Place ice in a plastic bag.  Place a towel between your splint and the bag.  Leave the ice on for 20 minutes, 2-3 times a day.  Do not get your splint wet.  Do not push objects under your splint to scratch your skin.  Loosen your splint if it feels too tight. Talk to your doctor if you have questions about how tight to wear the splint.  Keep all follow-up visits as told by your doctor. This is important. Get help if:  You have wrist pain or swelling that does not go away.  The skin around or under your splint becomes red, itchy, or moist.  You have  chills or fever.  Your splint feels too tight or too loose.  Your splint breaks. Get help right away if: You have:  Pain in your wrist that is getting worse.  Tingling and numbness.  Changes in skin color (paleness or a bluish color).  Cold fingers. This information is not intended to replace advice given to you by your health care provider. Make sure you discuss any questions you have with your health care provider. Document Released: 08/24/2007 Document Revised: 08/13/2015 Document Reviewed: 06/18/2013 Elsevier Interactive Patient Education  2017 [place] Carpal tunnel syndrome is a condition that causes pain in your hand and arm. The carpal tunnel is a narrow area that is on the palm side of your wrist.  Repeated wrist motion or certain diseases may cause swelling in the tunnel. This swelling can pinch the main nerve in the wrist (median nerve). Follow these instructions at home: If you have a splint:   Wear it as told by your doctor. Remove it only as told by your doctor.  Loosen the splint if your fingers:  Become numb and tingle.  Turn blue and cold.  Keep the splint clean and dry. General instructions   Take over-the-counter and prescription medicines only as told by your doctor.  Rest your wrist from any activity that may be causing your pain. If needed, talk to your employer about changes that can be made in your work, such as getting a wrist pad to use while typing.  If directed, apply ice to the painful area:  Put ice in a plastic bag.  Place a towel between your skin and the bag.  Leave the ice on for 20 minutes, 2-3 times per day.  Keep all follow-up visits as told by your doctor. This is important.  Do any exercises as told by your doctor, physical therapist, or occupational therapist. Contact a doctor if:  You have new symptoms.  Medicine does not help your pain.  Your symptoms get worse. This information is not intended to replace advice given to you by your health care provider. Make sure you discuss any questions you have with your health care provider. Document Released: 02/24/2011 Document Revised: 08/13/2015 Document Reviewed: 07/23/2014 Elsevier Interactive Patient Education  2017 [employer].

## 2016-07-04 NOTE — Progress Notes (Signed)
Patient ID: Alexandra Beasley, female    DOB: 25-Jan-1966, 50 y.o.   MRN: [id number]  PCP: Molli Barrows, FNP  Chief Complaint  Patient presents with  . Arm Pain    left   . Wrist Pain    left  . urine smells bad  . Referral    eye doctor    Subjective:  HPI Alexandra Beasley is a 51 y.o. female presents for evaluation of left arm, wrist pain, and urine smells. Medical problems include: Glaucoma Left shoulder pain present for 13 days. Pain is most pronounced at the [facility] joint. Reports worst with lying down. Pain is characterized as aching and radiates down into her left wrist. No history of injury. Wrist is tender with forward flexion and is unaffected by extension. Reports numbness and tingling in the lower left arm 2 and 3 digits. Reports a strong urine odor x 1 week. Denies low back pain or pelvic pressure, and or dysuria. Reports drinks lots of water. She has a history of glaucoma and requests a referral to opthalmology today due to worsening visual acuity .    Social History   Social History  . Marital status: Married    Spouse name: N/A  . Number of children: N/A  . Years of education: N/A   Occupational History  . Not on file.   Social History Main Topics  . Smoking status: Never Smoker  . Smokeless tobacco: Never Used  . Alcohol use No  . Drug use: No  . Sexual activity: No   Other Topics Concern  . Not on file   Social History Narrative  . No narrative on file    Family History  Problem Relation Age of Onset  . Vision loss Mother   . Hypertension Mother   . Heart disease Father    Review of Systems See HPI  Patient Active Problem List   Diagnosis Date Noted  . Glaucoma 06/04/2015  . Pain in joint, shoulder region 06/04/2015    No Known Allergies  Prior to Admission medications   Medication Sig Start Date End Date Taking? Authorizing Provider  latanoprost (XALATAN) 0.005 % ophthalmic solution Place 1 drop into both eyes daily.   Yes Historical  Provider, MD  lisinopril-hydrochlorothiazide (PRINZIDE,ZESTORETIC) 20-25 MG tablet Take 1 tablet by mouth daily. 12/28/15  Yes Micheline Chapman, NP  naproxen (NAPROSYN) 500 MG tablet Take 1 tablet (500 mg total) by mouth 2 (two) times daily as needed for mild pain, moderate pain or headache (TAKE WITH MEALS.). 02/01/16  Yes [place], PA-C  HYDROcodone-acetaminophen (NORCO) 5-325 MG tablet Take 1 tablet by mouth every 6 (six) hours as needed for severe pain. Patient not taking: Reported on 07/04/2016 02/01/16   Reece Agar, PA-C    Past Medical, Surgical Family and Social History reviewed and updated.    Objective:   Today's Vitals   07/04/16 1023  BP: 134/70  Pulse: 74  Resp: 16  Temp: 98.1 F (36.7 C)  TempSrc: Oral  SpO2: 100%  Weight: 182 lb (82.6 kg)  Height: 5\' 2"  (1.575 m)    Wt Readings from Last 3 Encounters:  07/04/16 182 lb (82.6 kg)  12/28/15 182 lb (82.6 kg)  06/04/15 183 lb (83 kg)    Physical Exam  Constitutional: She is oriented to person, place, and time. She appears well-developed and well-nourished.  HENT:  Head: Normocephalic and atraumatic.  Eyes: Conjunctivae and EOM are normal. Pupils are equal, round, and reactive to light.  Mild  sclera redness  Neck: Normal range of motion. Neck supple.  Cardiovascular: Normal rate, regular rhythm, normal heart sounds and intact distal pulses.   Pulmonary/Chest: Effort normal and breath sounds normal.  Musculoskeletal: Normal range of motion.  Neurological: She is alert and oriented to person, place, and time.  Skin: Skin is warm and dry.  Psychiatric: She has a normal mood and affect. Her behavior is normal. Judgment and thought content normal.     Assessment & Plan:  1. Carpal tunnel syndrome of left wrist -Start Gabapentin 300 mg BID as needed for shoulder and wrist pain -Try wearing a wrist splint and not sleeping on your left side to prevent paresthesia from reoccurring at bedtime.  2. Visual  disturbance - Ambulatory referral to Ophthalmology  3. Prediabetes - Hemoglobin A1c-last A1C 5.8 - COMPLETE METABOLIC PANEL WITH GFR - CBC with Differential  4. Abnormal urine odor, negative UA - Urine culture  5. Screen for colon cancer - Ambulatory referral to Gastroenterology  6. Screening for thyroid disorder - Thyroid Panel With TSH   RTC: 1 month to re-evaluate left shoulder and wrist pain  Carroll Sage. Kenton Kingfisher, MSN, [facility] [address] [place], [place] [address] [phone]

## 2016-07-05 LAB — THYROID PANEL WITH TSH
FREE THYROXINE INDEX: 3 (ref 1.4–3.8)
T3 Uptake: 25 % (ref 22–35)
T4, Total: 12.1 ug/dL — ABNORMAL HIGH (ref 4.5–12.0)
TSH: 2 m[IU]/L

## 2016-07-05 LAB — HEMOGLOBIN A1C
Hgb A1c MFr Bld: 5.7 % — ABNORMAL HIGH (ref <5.7)
MEAN PLASMA GLUCOSE: 117 mg/dL

## 2016-07-05 LAB — URINE CULTURE: Organism ID, Bacteria: NO GROWTH

## 2016-07-05 MED FILL — OMEPRAZOLE DR 40 MG CAPSULE: 40 | 30 days supply | Qty: 30 | Fill #0

## 2016-07-06 ENCOUNTER — Other Ambulatory Visit: Payer: Self-pay

## 2016-07-08 ENCOUNTER — Other Ambulatory Visit (HOSPITAL_COMMUNITY): Payer: Self-pay | Admitting: *Deleted

## 2016-07-08 DIAGNOSIS — R928 Other abnormal and inconclusive findings on diagnostic imaging of breast: Secondary | ICD-10-CM

## 2016-07-21 ENCOUNTER — Encounter (HOSPITAL_COMMUNITY): Payer: Self-pay

## 2016-07-21 ENCOUNTER — Ambulatory Visit
Admission: RE | Admit: 2016-07-21 | Discharge: 2016-07-21 | Disposition: A | Discharge status: Home / Self Care | Payer: No Typology Code available for payment source | Source: Ambulatory Visit | Attending: Obstetrics and Gynecology | Admitting: Obstetrics and Gynecology

## 2016-07-21 ENCOUNTER — Other Ambulatory Visit (HOSPITAL_COMMUNITY): Payer: Self-pay | Admitting: Obstetrics and Gynecology

## 2016-07-21 ENCOUNTER — Ambulatory Visit (HOSPITAL_COMMUNITY)
Admission: RE | Admit: 2016-07-21 | Discharge: 2016-07-21 | Disposition: A | Discharge status: Home / Self Care | Payer: Self-pay | Source: Ambulatory Visit | Attending: Obstetrics and Gynecology | Admitting: Obstetrics and Gynecology

## 2016-07-21 VITALS — BP 124/86 | Temp 98.9°F | Ht 64.0 in | Wt 181.6 lb

## 2016-07-21 DIAGNOSIS — N6489 Other specified disorders of breast: Secondary | ICD-10-CM

## 2016-07-21 DIAGNOSIS — R928 Other abnormal and inconclusive findings on diagnostic imaging of breast: Secondary | ICD-10-CM

## 2016-07-21 DIAGNOSIS — Z1239 Encounter for other screening for malignant neoplasm of breast: Secondary | ICD-10-CM

## 2016-07-21 NOTE — Patient Instructions (Signed)
Explained breast self awareness with Karma Greaser. Patient did not need a Pap smear today due to her history of a hysterectomy for benign reasons. Let patient know that she doesn't need any further Pap smears due to her history of a hysterectomy for benign reasons. Referred patient to the [facility] for left breast diagnostic mammogram and possible left breast ultrasound per recommendation. Appointment scheduled for Thursday, Jul 21, 2016 at 1120. Karma Greaser verbalized understanding.  Briella Hobday, Arvil Chaco, RN 12:51 PM

## 2016-07-21 NOTE — Progress Notes (Signed)
Patient referred to [facility] by the [facility] due to recommending additional imaging of the left breast. Screening mammogram completed 06/27/2016.  Pap Smear: Pap smear not completed today. Per patient unsure if she has had a Pap smear completed. Patient has a history of a hysterectomy for AUB 20 years ago. Patient no longer needs Pap smears due to her history of a hysterectomy for benign reasons per BCCCP and ACOG guidelines. No Pap smear results are in EPIC.  Physical exam: Breasts Breasts symmetrical. No skin abnormalities bilateral breasts. No nipple retraction bilateral breasts. No nipple discharge bilateral breasts. No lymphadenopathy. No lumps palpated bilateral breasts. No complaints of pain or tenderness on exam. Referred patient to the [facility] for left breast diagnostic mammogram and possible left breast ultrasound per recommendation. Appointment scheduled for Thursday, Jul 21, 2016 at 1120.        Pelvic/Bimanual No Pap smear completed today since patient has a history of a hysterectomy for benign reasons. Pap smear not indicated per BCCCP guidelines.   Smoking History: Patient has never smoked.  Patient Navigation: Patient education provided. Access to services provided for patient through [facility] program.   Colorectal Cancer Screening: Per patient has never had a colonoscopy completed. No complaints today. FIT Test given to patient to complete and return to BCCCP.

## 2016-07-22 ENCOUNTER — Ambulatory Visit
Admission: RE | Admit: 2016-07-22 | Discharge: 2016-07-22 | Disposition: A | Discharge status: Home / Self Care | Payer: Self-pay | Source: Ambulatory Visit | Attending: Obstetrics and Gynecology | Admitting: Obstetrics and Gynecology

## 2016-07-22 ENCOUNTER — Ambulatory Visit
Admission: RE | Admit: 2016-07-22 | Discharge: 2016-07-22 | Disposition: A | Discharge status: Home / Self Care | Payer: No Typology Code available for payment source | Source: Ambulatory Visit | Attending: Obstetrics and Gynecology | Admitting: Obstetrics and Gynecology

## 2016-07-22 ENCOUNTER — Other Ambulatory Visit (HOSPITAL_COMMUNITY): Payer: Self-pay | Admitting: Obstetrics and Gynecology

## 2016-07-22 ENCOUNTER — Encounter (HOSPITAL_COMMUNITY): Payer: Self-pay | Admitting: *Deleted

## 2016-07-22 DIAGNOSIS — N6489 Other specified disorders of breast: Secondary | ICD-10-CM

## 2016-07-25 ENCOUNTER — Other Ambulatory Visit: Payer: Self-pay | Admitting: Obstetrics and Gynecology

## 2016-08-01 LAB — FECAL OCCULT BLOOD, IMMUNOCHEMICAL: FECAL OCCULT BLD: NEGATIVE

## 2016-08-03 ENCOUNTER — Ambulatory Visit (INDEPENDENT_AMBULATORY_CARE_PROVIDER_SITE_OTHER): Payer: Self-pay | Admitting: Family Medicine

## 2016-08-03 ENCOUNTER — Encounter: Payer: Self-pay | Admitting: Family Medicine

## 2016-08-03 ENCOUNTER — Encounter (HOSPITAL_COMMUNITY): Payer: Self-pay | Admitting: *Deleted

## 2016-08-03 VITALS — BP 132/70 | HR 79 | Temp 98.3°F | Resp 14 | Ht 64.0 in | Wt 182.0 lb

## 2016-08-03 DIAGNOSIS — M25512 Pain in left shoulder: Secondary | ICD-10-CM

## 2016-08-03 MED ORDER — DICLOFENAC SODIUM 1 % TD CREA: TOPICAL_CREAM | TRANSDERMAL | 0 refills | Status: DC

## 2016-08-03 MED ORDER — PREDNISONE 20 MG PO TABS: ORAL_TABLET | ORAL | 0 refills | Status: DC

## 2016-08-03 MED FILL — predniSONE 20 MG TABS: 20 | 9 days supply | Qty: 18 | Fill #0

## 2016-08-03 NOTE — Patient Instructions (Addendum)
Take Prednisone 20 mg,  in mornings with breakfast as follows:  Take 3 pills for 3 days, Take 2 pills for 3 days, and Take 1 pill for 3 days.  Complete all medication.  Apply Diclofenac cream to left shoulder for pain twice daily as needed for pain.    Shoulder Pain Many things can cause shoulder pain, including:  An injury.  Moving the arm in the same way again and again (overuse).  Joint pain (arthritis). Follow these instructions at home: Take these actions to help with your pain:  Squeeze a soft ball or a foam pad as much as you can. This helps to prevent swelling. It also makes the arm stronger.  Take over-the-counter and prescription medicines only as told by your doctor.  If told, put ice on the area:  Put ice in a plastic bag.  Place a towel between your skin and the bag.  Leave the ice on for 20 minutes, 2-3 times per day. Stop putting on ice if it does not help with the pain.  If you were given a shoulder sling or immobilizer:  Wear it as told.  Remove it to shower or bathe.  Move your arm as little as possible.  Keep your hand moving. This helps prevent swelling. Contact a doctor if:  Your pain gets worse.  Medicine does not help your pain.  You have new pain in your arm, hand, or fingers. Get help right away if:  Your arm, hand, or fingers:  Tingle.  Are numb.  Are swollen.  Are painful.  Turn white or blue. This information is not intended to replace advice given to you by your health care provider. Make sure you discuss any questions you have with your health care provider. Document Released: 08/24/2007 Document Revised: 11/01/2015 Document Reviewed: 06/30/2014 Elsevier Interactive Patient Education  2017 [employer].

## 2016-08-03 NOTE — Progress Notes (Signed)
Patient ID: Alexandra Beasley, female    DOB: [birth year], 50 y.o.   MRN: [id number]  PCP: Scot Jun, FNP  Chief Complaint  Patient presents with  . Shoulder Pain    LEFT 1 MONTH    Subjective:  HPI  Alexandra Beasley is a 51 y.o. female presents for evaluation of left shoulder pain x 1 month. Alexandra Beasley was seen in clinic last month 07/04/2016 for left shoulder and wrist pain. She was treated with Gabapentin for pain and wrist splinting was prescribed for symptoms which were likely  resulting from carpal tunnel. Today she reports resolution of wrist pain however she continues to complain of  shoulder pain. Gabapentin helped with shoulder pain however caused abdominal gas and bloating. Shoulder pain is exacerbated by extension, rotation, and adduction. 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 By the end of the day, her shoulder pain is significant. She characterizes pain as aching and pulsating when it is at it's worst. Concerned regarding cost of referring to specialist and obtaining imaging. Would like to treat conservatively for now.  Social History   Social History  . Marital status: Married    Spouse name: N/A  . Number of children: N/A  . Years of education: N/A   Occupational History  . Not on file.   Social History Main Topics  . Smoking status: Never Smoker  . Smokeless tobacco: Never Used  . Alcohol use No  . Drug use: No  . Sexual activity: No   Other Topics Concern  . Not on file   Social History Narrative  . No narrative on file    Family History  Problem Relation Age of Onset  . Vision loss Mother   . Hypertension Mother   . Heart disease Father    Review of Systems See HPI   Patient Active Problem List   Diagnosis Date Noted  . Glaucoma 06/04/2015  . Pain in joint, shoulder region 06/04/2015    No Known Allergies  Prior to Admission medications   Medication Sig Start Date End Date Taking? Authorizing Provider   gabapentin (NEURONTIN) 300 MG capsule Take 1 capsule (300 mg total) by mouth 2 (two) times daily as needed. 07/04/16  Yes Scot Jun, FNP  ibuprofen (ADVIL,MOTRIN) 600 MG tablet Take 1 tablet (600 mg total) by mouth every 8 (eight) hours as needed. 07/04/16  Yes Scot Jun, FNP  latanoprost (XALATAN) 0.005 % ophthalmic solution Place 1 drop into both eyes daily.   Yes [provider]  lisinopril-hydrochlorothiazide (PRINZIDE,ZESTORETIC) 20-25 MG tablet Take 1 tablet by mouth daily. 12/28/15  Yes Micheline Chapman, NP  HYDROcodone-acetaminophen (NORCO) 5-325 MG tablet Take 1 tablet by mouth every 6 (six) hours as needed for severe pain. Patient not taking: Reported on 08/03/2016 02/01/16   Street, [place], PA-C  naproxen (NAPROSYN) 500 MG tablet Take 1 tablet (500 mg total) by mouth 2 (two) times daily as needed for mild pain, moderate pain or headache (TAKE WITH MEALS.). Patient not taking: Reported on 08/03/2016 02/01/16   Street, [place], PA-C   Past Medical, Surgical Family and Social History reviewed and updated.   Objective:   Today's Vitals   08/03/16 0941  BP: 132/70  Pulse: 79  Resp: 14  Temp: 98.3 F (36.8 C)  TempSrc: Oral  SpO2: 98%  Weight: 182 lb (82.6 kg)  Height: 5\' 4"  (1.626 m)    Wt Readings from Last 3 Encounters:  08/03/16 182 lb (82.6 kg)  07/21/16 181 lb 9.6 oz (82.4 kg)  07/04/16 182 lb (82.6 kg)    Physical Exam  Constitutional: She appears well-developed and well-nourished.  Cardiovascular: Normal rate, regular rhythm, normal heart sounds and intact distal pulses.   Pulmonary/Chest: Effort normal and breath sounds normal.  Musculoskeletal:       Left shoulder: She exhibits decreased range of motion, tenderness and bony tenderness. She exhibits no swelling, no effusion, no crepitus and normal strength.  Pain with external rotation, adduction, flexion   Psychiatric: She has a normal mood and affect. Her speech is normal. Judgment  normal. Cognition and memory are normal.     Assessment & Plan:  1. Acute pain of left shoulder -Take Prednisone 20 mg,  in mornings with breakfast as follows:  Take 3 pills for 3 days, Take 2 pills for 3 days, and Take 1 pill for 3 days.  Complete all medication. -Apply Diclofenac Cream to left shoulder twice daily as needed for pain. -Contact me via phone after prednisone taper, if not improvement, I will obtain a x-ray of shoulder and refer patient to ortho for evaluation of shoulder joint injection therapy.  RTC: 6 weeks for shoulder pain  Carroll Sage. Kenton Kingfisher, MSN, [facility] [address] [place], [place] [address] [phone]

## 2016-08-04 ENCOUNTER — Other Ambulatory Visit: Payer: Self-pay | Admitting: Family Medicine

## 2016-08-04 MED ORDER — DICLOFENAC SODIUM 1 % TD GEL: 4.0000 g | Freq: Four times a day (QID) | TRANSDERMAL | 2 refills | Status: DC

## 2016-08-04 MED FILL — VOLTAREN 1% GEL: 1 | 6 days supply | Qty: 100 | Fill #0

## 2016-08-04 NOTE — Progress Notes (Signed)
Diclofenac resubmitted.

## 2016-08-05 ENCOUNTER — Encounter: Payer: Self-pay | Admitting: Family Medicine

## 2016-08-11 ENCOUNTER — Other Ambulatory Visit: Payer: Self-pay | Admitting: General Surgery

## 2016-08-11 DIAGNOSIS — R928 Other abnormal and inconclusive findings on diagnostic imaging of breast: Secondary | ICD-10-CM

## 2016-08-16 ENCOUNTER — Other Ambulatory Visit: Payer: Self-pay | Admitting: General Surgery

## 2016-08-16 DIAGNOSIS — R928 Other abnormal and inconclusive findings on diagnostic imaging of breast: Secondary | ICD-10-CM

## 2016-08-24 ENCOUNTER — Encounter (HOSPITAL_BASED_OUTPATIENT_CLINIC_OR_DEPARTMENT_OTHER): Payer: Self-pay | Admitting: *Deleted

## 2016-08-24 NOTE — Progress Notes (Signed)
Spoke with patient's daughter Uvaldo Bristle on phone and obtained history. Patient will come in for labs and EKG, boost. f Family will bring DOS and will interpret per family and patient request request.  Told daughter she would have to sign a waiver to not have hospital interpreter present, she said OK.

## 2016-08-25 ENCOUNTER — Encounter (HOSPITAL_BASED_OUTPATIENT_CLINIC_OR_DEPARTMENT_OTHER)
Admission: RE | Admit: 2016-08-25 | Discharge: 2016-08-25 | Disposition: A | Discharge status: Home / Self Care | Payer: Medicaid Other | Source: Ambulatory Visit | Attending: General Surgery | Admitting: General Surgery

## 2016-08-25 DIAGNOSIS — Z8249 Family history of ischemic heart disease and other diseases of the circulatory system: Secondary | ICD-10-CM | POA: Insufficient documentation

## 2016-08-25 DIAGNOSIS — M25512 Pain in left shoulder: Secondary | ICD-10-CM | POA: Insufficient documentation

## 2016-08-25 DIAGNOSIS — Z79899 Other long term (current) drug therapy: Secondary | ICD-10-CM | POA: Diagnosis not present

## 2016-08-25 DIAGNOSIS — Z01810 Encounter for preprocedural cardiovascular examination: Secondary | ICD-10-CM | POA: Diagnosis not present

## 2016-08-25 DIAGNOSIS — H409 Unspecified glaucoma: Secondary | ICD-10-CM | POA: Diagnosis not present

## 2016-08-25 DIAGNOSIS — Z01812 Encounter for preprocedural laboratory examination: Secondary | ICD-10-CM | POA: Diagnosis not present

## 2016-08-25 LAB — CBC WITH DIFFERENTIAL/PLATELET
BASOS ABS: 0 10*3/uL (ref 0.0–0.1)
BASOS PCT: 0 %
Eosinophils Absolute: 0.2 10*3/uL (ref 0.0–0.7)
Eosinophils Relative: 3 %
HEMATOCRIT: 35.1 % — AB (ref 36.0–46.0)
HEMOGLOBIN: 11.1 g/dL — AB (ref 12.0–15.0)
Lymphocytes Relative: 18 %
Lymphs Abs: 1.4 10*3/uL (ref 0.7–4.0)
MCH: 27 pg (ref 26.0–34.0)
MCHC: 31.6 g/dL (ref 30.0–36.0)
MCV: 85.4 fL (ref 78.0–100.0)
Monocytes Absolute: 0.6 10*3/uL (ref 0.1–1.0)
Monocytes Relative: 8 %
NEUTROS ABS: 5.5 10*3/uL (ref 1.7–7.7)
Neutrophils Relative %: 71 %
Platelets: 227 10*3/uL (ref 150–400)
RBC: 4.11 MIL/uL (ref 3.87–5.11)
RDW: 15.2 % (ref 11.5–15.5)
WBC: 7.8 10*3/uL (ref 4.0–10.5)

## 2016-08-25 LAB — COMPREHENSIVE METABOLIC PANEL
ALBUMIN: 3.3 g/dL — AB (ref 3.5–5.0)
ALK PHOS: 68 U/L (ref 38–126)
ALT: 28 U/L (ref 14–54)
AST: 30 U/L (ref 15–41)
Anion gap: 6 (ref 5–15)
BILIRUBIN TOTAL: 0.8 mg/dL (ref 0.3–1.2)
BUN: 9 mg/dL (ref 6–20)
CALCIUM: 9 mg/dL (ref 8.9–10.3)
CO2: 22 mmol/L (ref 22–32)
Chloride: 109 mmol/L (ref 101–111)
Creatinine, Ser: 0.54 mg/dL (ref 0.44–1.00)
GFR calc Af Amer: >60 mL/min (ref >60)
GFR calc non Af Amer: >60 mL/min (ref >60)
GLUCOSE: 96 mg/dL (ref 65–99)
Potassium: 4.2 mmol/L (ref 3.5–5.1)
SODIUM: 137 mmol/L (ref 135–145)
TOTAL PROTEIN: 6.5 g/dL (ref 6.5–8.1)

## 2016-08-25 NOTE — Progress Notes (Signed)
Pt in for PAT appt, EKG reviewed by Dr Sabra Heck. Boost breeze given and instructions reviewed with pt and son.

## 2016-08-29 NOTE — H&P (Signed)
Alexandra Beasley Location: [facility] Patient #: [id number] DOB: 15-Sep-1965 Married / Language: English / Race: Refused to Report/Unreported Female       History of Present Illness  The patient is a 51 year old female who presents with a breast mass. This is a 51 year old [place] woman, referred by Dr. Autumn Patty at the [facility] for evaluation of complex sclerosing lesion left breast. Dr. Sharon Seller is her PCP. Her son and daughter are with her and provide translation when complicated medical terms are used.  She has no prior history of breast problems. Gets screening mammograms. Recent screening mammogram showed category B density, focal area of suspicious distortion in the left breast, lower, slightly inner quadrant. Ultrasound showed no mass or axillary adenopathy. Stereotactic core biopsy shows complex sclerosing lesion. She is doing well.  Past history significant for hysterectomy and one ovary removed. This was for benign bleeding disorder. Hypertension. Glaucoma. Family history is negative for breast or ovarian cancer. Mother living with hypertension. Father living with heart disease Social history reveals she lives in [place] and lives with Her two children. 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 Denies alcohol or tobacco.  I told the patient and her children about CSL. They are aware that most likely she does not have cancer but there is a 5-10% chance of an upgrade on complete excision. I told him that complete excision with radioactive seed localization is indicated she would like to do that. She'll be scheduled for left breast lumpectomy with radioactive seed localization in the near future. I discussed the indications, details, techniques, and numerous risk of the surgery with them all. They're aware of the risk of bleeding, infection, cosmetic deformity, nerve damage with chronic pain or numbness, reoperation of cancer,  and other unforeseen problems. They understand these issues well. All her questions were answered. They agree with this plan.   Past Surgical History  Cesarean Section - Multiple   Diagnostic Studies History  Colonoscopy  never Pap Smear  never  Allergies  No Known Drug Allergies  Allergies Reconciled   Medication History Gabapentin (300MG  Capsule, Oral) Active. Ibuprofen (600MG  Tablet, Oral) Active. PredniSONE (20MG  Tablet, Oral) Active. Voltaren (1% Gel, Transdermal) Active. Lisinopril-Hydrochlorothiazide (Oral) Specific strength unknown - Active. Medications Reconciled  Social History  No drug use   Pregnancy / Birth History  Age at menarche  41 years. Age of menopause  <43 Gravida  3 Length (months) of breastfeeding  7-12 Maternal age  41-25 Para  2  Other Problems  High blood pressure     Review of Systems  General Not Present- Appetite Loss, Chills, Fatigue, Fever, Night Sweats, Weight Gain and Weight Loss. Skin Not Present- Change in Wart/Mole, Dryness, Hives, Jaundice, New Lesions, Non-Healing Wounds, Rash and Ulcer. HEENT Present- Wears glasses/contact lenses. Not Present- Earache, Hearing Loss, Hoarseness, Nose Bleed, Oral Ulcers, Ringing in the Ears, Seasonal Allergies, Sinus Pain, Sore Throat, Visual Disturbances and Yellow Eyes. Respiratory Not Present- Bloody sputum, Chronic Cough, Difficulty Breathing, Snoring and Wheezing. Cardiovascular Not Present- Chest Pain, Difficulty Breathing Lying Down, Leg Cramps, Palpitations, Rapid Heart Rate, Shortness of Breath and Swelling of Extremities. Gastrointestinal Not Present- Abdominal Pain, Bloating, Bloody Stool, Change in Bowel Habits, Chronic diarrhea, Constipation, Difficulty Swallowing, Excessive gas, Gets full quickly at meals, Hemorrhoids, Indigestion, Nausea, Rectal Pain and Vomiting. Female Genitourinary Not Present- Frequency, Nocturia, Painful Urination, Pelvic Pain and  Urgency. Musculoskeletal Present- Joint Pain. Not Present- Back Pain, Joint Stiffness, Muscle Pain,  Muscle Weakness and Swelling of Extremities. Neurological Not Present- Decreased Memory, Fainting, Headaches, Numbness, Seizures, Tingling, Tremor, Trouble walking and Weakness. Psychiatric Not Present- Anxiety, Bipolar, Change in Sleep Pattern, Depression, Fearful and Frequent crying. Endocrine Not Present- Cold Intolerance, Excessive Hunger, Hair Changes, Heat Intolerance, Hot flashes and New Diabetes. Hematology Not Present- Blood Thinners, Easy Bruising, Excessive bleeding, Gland problems, HIV and Persistent Infections.  Vitals Weight: 182.4 lb Height: 64in Body Surface Area: 1.88 m Body Mass Index: 31.31 kg/m  Temp.: 97.42F  Pulse: 63 (Regular)  BP: 142/78 (Sitting, Left Arm, Standard)    Physical Exam  General Mental Status-Alert. General Appearance-Consistent with stated age. Hydration-Well hydrated. Voice-Normal.  Head and Neck Head-normocephalic, atraumatic with no lesions or palpable masses. Trachea-midline. Thyroid Gland Characteristics - normal size and consistency.  Eye Eyeball - Bilateral-Extraocular movements intact. Sclera/Conjunctiva - Bilateral-No scleral icterus.  Chest and Lung Exam Chest and lung exam reveals -quiet, even and easy respiratory effort with no use of accessory muscles and on auscultation, normal breath sounds, no adventitious sounds and normal vocal resonance. Inspection Chest Wall - Normal. Back - normal.  Breast Breast - Left-Symmetric, Non Tender, No Biopsy scars, no Dimpling, No Inflammation, No Lumpectomy scars, No Mastectomy scars, No Peau d' Orange. Breast - Right-Symmetric, Non Tender, No Biopsy scars, no Dimpling, No Inflammation, No Lumpectomy scars, No Mastectomy scars, No Peau d' Orange. Breast Lump-No Palpable Breast Mass. Note: Healing biopsy site left breast inferomedially. No hematoma. No  other skin changes in either breast. Breast are soft. Medium size. No palpable mass. No adenopathy on either side.   Cardiovascular Cardiovascular examination reveals -normal heart sounds, regular rate and rhythm with no murmurs and normal pedal pulses bilaterally.  Abdomen Inspection Inspection of the abdomen reveals - No Hernias. Skin - Scar - Note: Healed Pfannenstiel incision. Palpation/Percussion Palpation and Percussion of the abdomen reveal - Soft, Non Tender, No Rebound tenderness, No Rigidity (guarding) and No hepatosplenomegaly. Auscultation Auscultation of the abdomen reveals - Bowel sounds normal.  Neurologic Neurologic evaluation reveals -alert and oriented x 3 with no impairment of recent or remote memory. Mental Status-Normal.  Musculoskeletal Normal Exam - Left-Upper Extremity Strength Normal and Lower Extremity Strength Normal. Normal Exam - Right-Upper Extremity Strength Normal and Lower Extremity Strength Normal.  Lymphatic Head & Neck  General Head & Neck Lymphatics: Bilateral - Description - Normal. Axillary  General Axillary Region: Bilateral - Description - Normal. Tenderness - Non Tender. Femoral & Inguinal  Generalized Femoral & Inguinal Lymphatics: Bilateral - Description - Normal. Tenderness - Non Tender.    Assessment & Plan  ABNORMALITY OF LEFT BREAST ON SCREENING MAMMOGRAM (R92.8)   Your recent imaging studies showed a small focal area of suspicious distortion in the left breast, lower inner quadrant Needle core biopsy revealed a condition called complex sclerosing lesion Most likely you do not have cancer However, there is a 5-10% chance that she could have low-grade cancer right now Excisional biopsy is recommended for the reasons I discussed  You have stated that you would like this area excised, and you will be scheduled for left breast lumpectomy with radioactive seed localization I discussed the indications, techniques, and  risk of this surgery in detail  GLAUCOMA (H40.9) HYPERTENSION, ESSENTIAL (I10) HISTORY OF TOTAL ABDOMINAL HYSTERECTOMY AND BILATERAL SALPINGO-OOPHORECTOMY (Z90.710) BMI 31.0-31.9,ADULT (Z68.31)   Alexandra Beasley M. Dalbert Batman, M.D., [facility] General and Minimally invasive Surgery Breast and Colorectal Surgery Office:   [phone] Pager:   [phone]

## 2016-08-30 ENCOUNTER — Ambulatory Visit
Admission: RE | Admit: 2016-08-30 | Discharge: 2016-08-30 | Disposition: A | Discharge status: Home / Self Care | Payer: Self-pay | Source: Ambulatory Visit | Attending: General Surgery | Admitting: General Surgery

## 2016-08-30 DIAGNOSIS — R928 Other abnormal and inconclusive findings on diagnostic imaging of breast: Secondary | ICD-10-CM

## 2016-08-31 ENCOUNTER — Encounter (HOSPITAL_BASED_OUTPATIENT_CLINIC_OR_DEPARTMENT_OTHER): Payer: Self-pay | Admitting: *Deleted

## 2016-08-31 ENCOUNTER — Ambulatory Visit
Admission: RE | Admit: 2016-08-31 | Discharge: 2016-08-31 | Disposition: A | Discharge status: Home / Self Care | Payer: No Typology Code available for payment source | Source: Ambulatory Visit | Attending: General Surgery | Admitting: General Surgery

## 2016-08-31 ENCOUNTER — Encounter (HOSPITAL_BASED_OUTPATIENT_CLINIC_OR_DEPARTMENT_OTHER)
Admission: RE | Disposition: A | Discharge status: Home / Self Care | Payer: Self-pay | Source: Ambulatory Visit | Attending: General Surgery

## 2016-08-31 ENCOUNTER — Ambulatory Visit (HOSPITAL_BASED_OUTPATIENT_CLINIC_OR_DEPARTMENT_OTHER)
Admission: RE | Admit: 2016-08-31 | Discharge: 2016-08-31 | Disposition: A | Discharge status: Home / Self Care | Payer: Medicaid Other | Source: Ambulatory Visit | Attending: General Surgery | Admitting: General Surgery

## 2016-08-31 ENCOUNTER — Ambulatory Visit (HOSPITAL_BASED_OUTPATIENT_CLINIC_OR_DEPARTMENT_OTHER): Payer: Medicaid Other | Admitting: Anesthesiology

## 2016-08-31 DIAGNOSIS — E669 Obesity, unspecified: Secondary | ICD-10-CM | POA: Diagnosis not present

## 2016-08-31 DIAGNOSIS — Z79899 Other long term (current) drug therapy: Secondary | ICD-10-CM | POA: Insufficient documentation

## 2016-08-31 DIAGNOSIS — I10 Essential (primary) hypertension: Secondary | ICD-10-CM | POA: Insufficient documentation

## 2016-08-31 DIAGNOSIS — H409 Unspecified glaucoma: Secondary | ICD-10-CM | POA: Insufficient documentation

## 2016-08-31 DIAGNOSIS — R928 Other abnormal and inconclusive findings on diagnostic imaging of breast: Secondary | ICD-10-CM

## 2016-08-31 DIAGNOSIS — Z6831 Body mass index (BMI) 31.0-31.9, adult: Secondary | ICD-10-CM | POA: Diagnosis not present

## 2016-08-31 DIAGNOSIS — N6092 Unspecified benign mammary dysplasia of left breast: Secondary | ICD-10-CM | POA: Diagnosis not present

## 2016-08-31 DIAGNOSIS — N6489 Other specified disorders of breast: Secondary | ICD-10-CM | POA: Diagnosis present

## 2016-08-31 HISTORY — DX: Unspecified lump in the left breast, unspecified quadrant: N63.20

## 2016-08-31 HISTORY — DX: Other abnormal and inconclusive findings on diagnostic imaging of breast: R92.8

## 2016-08-31 HISTORY — PX: BREAST LUMPECTOMY WITH RADIOACTIVE SEED LOCALIZATION: SHX6424

## 2016-08-31 SURGERY — BREAST LUMPECTOMY WITH RADIOACTIVE SEED LOCALIZATION
Anesthesia: General | Site: Breast | Laterality: Left

## 2016-08-31 MED ORDER — METOCLOPRAMIDE HCL 5 MG/ML IJ SOLN: 10.0000 mg | Freq: Once | INTRAMUSCULAR | Status: DC | PRN

## 2016-08-31 MED ORDER — EPHEDRINE SULFATE 50 MG/ML IJ SOLN
INTRAMUSCULAR | Status: DC | PRN
  Administered 2016-08-31: 10 mg via INTRAVENOUS

## 2016-08-31 MED ORDER — SODIUM CHLORIDE 0.9% FLUSH: 3.0000 mL | Freq: Two times a day (BID) | INTRAVENOUS | Status: DC

## 2016-08-31 MED ORDER — SODIUM CHLORIDE 0.9 % IV SOLN: 250.0000 mL | INTRAVENOUS | Status: DC | PRN

## 2016-08-31 MED ORDER — ACETAMINOPHEN 500 MG PO TABS
1000.0000 mg | ORAL_TABLET | ORAL | Status: AC
  Administered 2016-08-31: 1000 mg via ORAL

## 2016-08-31 MED ORDER — OXYCODONE HCL 5 MG PO TABS
5.0000 mg | ORAL_TABLET | ORAL | Status: DC | PRN
  Administered 2016-08-31: 5 mg via ORAL

## 2016-08-31 MED ORDER — LIDOCAINE 2% (20 MG/ML) 5 ML SYRINGE
INTRAMUSCULAR | Status: AC
  Filled 2016-08-31: qty 5

## 2016-08-31 MED ORDER — ACETAMINOPHEN 500 MG PO TABS
ORAL_TABLET | ORAL | Status: AC
  Filled 2016-08-31: qty 2

## 2016-08-31 MED ORDER — DEXAMETHASONE SODIUM PHOSPHATE 10 MG/ML IJ SOLN
INTRAMUSCULAR | Status: AC
  Filled 2016-08-31: qty 1

## 2016-08-31 MED ORDER — DEXAMETHASONE SODIUM PHOSPHATE 4 MG/ML IJ SOLN
INTRAMUSCULAR | Status: DC | PRN
  Administered 2016-08-31: 10 mg via INTRAVENOUS

## 2016-08-31 MED ORDER — FENTANYL CITRATE (PF) 100 MCG/2ML IJ SOLN
INTRAMUSCULAR | Status: AC
  Filled 2016-08-31: qty 2

## 2016-08-31 MED ORDER — LIDOCAINE 2% (20 MG/ML) 5 ML SYRINGE
INTRAMUSCULAR | Status: DC | PRN
  Administered 2016-08-31: 60 mg via INTRAVENOUS

## 2016-08-31 MED ORDER — CELECOXIB 200 MG PO CAPS
ORAL_CAPSULE | ORAL | Status: AC
  Filled 2016-08-31: qty 2

## 2016-08-31 MED ORDER — PROPOFOL 10 MG/ML IV BOLUS
INTRAVENOUS | Status: DC | PRN
  Administered 2016-08-31: 150 mg via INTRAVENOUS

## 2016-08-31 MED ORDER — SCOPOLAMINE 1 MG/3DAYS TD PT72: 1.0000 | MEDICATED_PATCH | Freq: Once | TRANSDERMAL | Status: DC | PRN

## 2016-08-31 MED ORDER — MEPERIDINE HCL 25 MG/ML IJ SOLN: 6.2500 mg | INTRAMUSCULAR | Status: DC | PRN

## 2016-08-31 MED ORDER — PHENYLEPHRINE 40 MCG/ML (10ML) SYRINGE FOR IV PUSH (FOR BLOOD PRESSURE SUPPORT)
PREFILLED_SYRINGE | INTRAVENOUS | Status: AC
Start: 2016-08-31 — End: 2016-08-31
  Filled 2016-08-31: qty 10

## 2016-08-31 MED ORDER — CHLORHEXIDINE GLUCONATE CLOTH 2 % EX PADS: 6.0000 | MEDICATED_PAD | Freq: Once | CUTANEOUS | Status: DC

## 2016-08-31 MED ORDER — CEFAZOLIN SODIUM-DEXTROSE 2-4 GM/100ML-% IV SOLN
INTRAVENOUS | Status: AC
  Filled 2016-08-31: qty 100

## 2016-08-31 MED ORDER — CELECOXIB 400 MG PO CAPS
400.0000 mg | ORAL_CAPSULE | ORAL | Status: AC
  Administered 2016-08-31: 400 mg via ORAL

## 2016-08-31 MED ORDER — SODIUM CHLORIDE 0.9% FLUSH: 3.0000 mL | INTRAVENOUS | Status: DC | PRN

## 2016-08-31 MED ORDER — EPHEDRINE 5 MG/ML INJ
INTRAVENOUS | Status: AC
  Filled 2016-08-31: qty 10

## 2016-08-31 MED ORDER — GABAPENTIN 300 MG PO CAPS
ORAL_CAPSULE | ORAL | Status: AC
  Filled 2016-08-31: qty 1

## 2016-08-31 MED ORDER — FENTANYL CITRATE (PF) 100 MCG/2ML IJ SOLN
25.0000 ug | INTRAMUSCULAR | Status: DC | PRN
  Administered 2016-08-31: 25 ug via INTRAVENOUS

## 2016-08-31 MED ORDER — HYDROCODONE-ACETAMINOPHEN 5-325 MG PO TABS
ORAL_TABLET | ORAL | Status: AC
  Filled 2016-08-31: qty 1

## 2016-08-31 MED ORDER — MIDAZOLAM HCL 2 MG/2ML IJ SOLN
INTRAMUSCULAR | Status: AC
  Filled 2016-08-31: qty 2

## 2016-08-31 MED ORDER — BUPIVACAINE-EPINEPHRINE (PF) 0.5% -1:200000 IJ SOLN
INTRAMUSCULAR | Status: DC | PRN
  Administered 2016-08-31: 10 mL

## 2016-08-31 MED ORDER — LACTATED RINGERS IV SOLN: INTRAVENOUS | Status: DC

## 2016-08-31 MED ORDER — CEFAZOLIN SODIUM-DEXTROSE 2-4 GM/100ML-% IV SOLN
2.0000 g | INTRAVENOUS | Status: AC
  Administered 2016-08-31: 2 g via INTRAVENOUS

## 2016-08-31 MED ORDER — OXYCODONE HCL 5 MG PO TABS
ORAL_TABLET | ORAL | Status: AC
  Filled 2016-08-31: qty 1

## 2016-08-31 MED ORDER — GABAPENTIN 300 MG PO CAPS
300.0000 mg | ORAL_CAPSULE | ORAL | Status: AC
  Administered 2016-08-31: 300 mg via ORAL

## 2016-08-31 MED ORDER — PHENYLEPHRINE HCL 10 MG/ML IJ SOLN
INTRAMUSCULAR | Status: DC | PRN
  Administered 2016-08-31 (×3): 80 ug via INTRAVENOUS

## 2016-08-31 MED ORDER — ACETAMINOPHEN 650 MG RE SUPP: 650.0000 mg | RECTAL | Status: DC | PRN

## 2016-08-31 MED ORDER — MIDAZOLAM HCL 2 MG/2ML IJ SOLN
1.0000 mg | INTRAMUSCULAR | Status: DC | PRN
  Administered 2016-08-31: 2 mg via INTRAVENOUS

## 2016-08-31 MED ORDER — FENTANYL CITRATE (PF) 100 MCG/2ML IJ SOLN
50.0000 ug | INTRAMUSCULAR | Status: DC | PRN
  Administered 2016-08-31 (×2): 50 ug via INTRAVENOUS

## 2016-08-31 MED ORDER — ONDANSETRON HCL 4 MG/2ML IJ SOLN
INTRAMUSCULAR | Status: AC
  Filled 2016-08-31: qty 2

## 2016-08-31 MED ORDER — ACETAMINOPHEN 325 MG PO TABS: 650.0000 mg | ORAL_TABLET | ORAL | Status: DC | PRN

## 2016-08-31 MED ORDER — LACTATED RINGERS IV SOLN
INTRAVENOUS | Status: DC
  Administered 2016-08-31 (×2): via INTRAVENOUS

## 2016-08-31 MED ORDER — ONDANSETRON HCL 4 MG/2ML IJ SOLN
INTRAMUSCULAR | Status: DC | PRN
  Administered 2016-08-31: 4 mg via INTRAVENOUS

## 2016-08-31 MED ORDER — FENTANYL CITRATE (PF) 100 MCG/2ML IJ SOLN: 25.0000 ug | INTRAMUSCULAR | Status: DC | PRN

## 2016-08-31 MED ORDER — HYDROCODONE-ACETAMINOPHEN 5-325 MG PO TABS: 1.0000 | ORAL_TABLET | Freq: Four times a day (QID) | ORAL | 0 refills | Status: DC | PRN

## 2016-08-31 MED ORDER — HYDROCODONE-ACETAMINOPHEN 7.5-325 MG PO TABS: 1.0000 | ORAL_TABLET | Freq: Once | ORAL | Status: DC | PRN

## 2016-08-31 MED FILL — HYDROCODON-APAP 5-325: 5-325 | 4 days supply | Qty: 30 | Fill #0

## 2016-08-31 SURGICAL SUPPLY — 58 items
APPLIER CLIP 9.375 MED OPEN (MISCELLANEOUS) ×2
BENZOIN TINCTURE PRP APPL 2/3 (GAUZE/BANDAGES/DRESSINGS) IMPLANT
BINDER BREAST LRG (GAUZE/BANDAGES/DRESSINGS) IMPLANT
BINDER BREAST MEDIUM (GAUZE/BANDAGES/DRESSINGS) IMPLANT
BINDER BREAST XLRG (GAUZE/BANDAGES/DRESSINGS) ×2 IMPLANT
BINDER BREAST XXLRG (GAUZE/BANDAGES/DRESSINGS) IMPLANT
BLADE HEX COATED 2.75 (ELECTRODE) ×2 IMPLANT
BLADE SURG 10 STRL SS (BLADE) IMPLANT
BLADE SURG 15 STRL LF DISP TIS (BLADE) ×1 IMPLANT
BLADE SURG 15 STRL SS (BLADE) ×1
CANISTER SUC SOCK COL 7IN (MISCELLANEOUS) IMPLANT
CANISTER SUCT 1200ML W/VALVE (MISCELLANEOUS) ×2 IMPLANT
CHLORAPREP W/TINT 26ML (MISCELLANEOUS) ×2 IMPLANT
CLIP APPLIE 9.375 MED OPEN (MISCELLANEOUS) ×1 IMPLANT
COVER BACK TABLE 60X90IN (DRAPES) ×2 IMPLANT
COVER MAYO STAND STRL (DRAPES) ×2 IMPLANT
COVER PROBE W GEL 5X96 (DRAPES) ×2 IMPLANT
DECANTER SPIKE VIAL GLASS SM (MISCELLANEOUS) IMPLANT
DERMABOND ADVANCED (GAUZE/BANDAGES/DRESSINGS) ×1
DERMABOND ADVANCED .7 DNX12 (GAUZE/BANDAGES/DRESSINGS) ×1 IMPLANT
DEVICE DUBIN W/COMP PLATE 8390 (MISCELLANEOUS) ×2 IMPLANT
DRAPE LAPAROSCOPIC ABDOMINAL (DRAPES) ×2 IMPLANT
DRAPE UTILITY XL STRL (DRAPES) ×2 IMPLANT
DRSG PAD ABDOMINAL 8X10 ST (GAUZE/BANDAGES/DRESSINGS) ×2 IMPLANT
ELECT REM PT RETURN 9FT ADLT (ELECTROSURGICAL) ×2
ELECTRODE REM PT RTRN 9FT ADLT (ELECTROSURGICAL) ×1 IMPLANT
GAUZE SPONGE 4X4 12PLY STRL LF (GAUZE/BANDAGES/DRESSINGS) ×2 IMPLANT
GLOVE BIO SURGEON STRL SZ7 (GLOVE) ×4 IMPLANT
GLOVE EUDERMIC 7 POWDERFREE (GLOVE) ×2 IMPLANT
GOWN STRL REUS W/ TWL LRG LVL3 (GOWN DISPOSABLE) ×1 IMPLANT
GOWN STRL REUS W/ TWL XL LVL3 (GOWN DISPOSABLE) ×1 IMPLANT
GOWN STRL REUS W/TWL LRG LVL3 (GOWN DISPOSABLE) ×1
GOWN STRL REUS W/TWL XL LVL3 (GOWN DISPOSABLE) ×1
ILLUMINATOR WAVEGUIDE N/F (MISCELLANEOUS) IMPLANT
KIT MARKER MARGIN INK (KITS) ×2 IMPLANT
LIGHT WAVEGUIDE WIDE FLAT (MISCELLANEOUS) IMPLANT
NEEDLE HYPO 25X1 1.5 SAFETY (NEEDLE) ×2 IMPLANT
NS IRRIG 1000ML POUR BTL (IV SOLUTION) ×2 IMPLANT
PACK BASIN DAY SURGERY FS (CUSTOM PROCEDURE TRAY) ×2 IMPLANT
PENCIL BUTTON HOLSTER BLD 10FT (ELECTRODE) ×2 IMPLANT
SHEET MEDIUM DRAPE 40X70 STRL (DRAPES) IMPLANT
SLEEVE SCD COMPRESS KNEE MED (MISCELLANEOUS) ×2 IMPLANT
SPONGE LAP 18X18 X RAY DECT (DISPOSABLE) IMPLANT
SPONGE LAP 4X18 X RAY DECT (DISPOSABLE) ×2 IMPLANT
STRIP CLOSURE SKIN 1/2X4 (GAUZE/BANDAGES/DRESSINGS) IMPLANT
SUT ETHILON 3 0 FSL (SUTURE) IMPLANT
SUT MNCRL AB 4-0 PS2 18 (SUTURE) ×2 IMPLANT
SUT SILK 2 0 SH (SUTURE) ×2 IMPLANT
SUT VIC AB 2-0 CT1 27 (SUTURE)
SUT VIC AB 2-0 CT1 TAPERPNT 27 (SUTURE) IMPLANT
SUT VIC AB 3-0 SH 27 (SUTURE)
SUT VIC AB 3-0 SH 27X BRD (SUTURE) IMPLANT
SUT VICRYL 3-0 CR8 SH (SUTURE) ×2 IMPLANT
SYR 10ML LL (SYRINGE) ×2 IMPLANT
TOWEL OR 17X24 6PK STRL BLUE (TOWEL DISPOSABLE) ×2 IMPLANT
TOWEL OR NON WOVEN STRL DISP B (DISPOSABLE) ×2 IMPLANT
TUBE CONNECTING 20X1/4 (TUBING) ×2 IMPLANT
YANKAUER SUCT BULB TIP NO VENT (SUCTIONS) ×2 IMPLANT

## 2016-08-31 NOTE — Discharge Instructions (Signed)
[facility] °Office Phone Number [phone] ° °BREAST BIOPSY/ PARTIAL MASTECTOMY: POST OP INSTRUCTIONS ° °Always review your discharge instruction sheet given to you by the facility where your surgery was performed. ° °IF YOU HAVE DISABILITY OR FAMILY LEAVE FORMS, YOU MUST BRING THEM TO THE OFFICE FOR PROCESSING.  DO NOT GIVE THEM TO YOUR DOCTOR. ° °1. A prescription for pain medication may be given to you upon discharge.  Take your pain medication as prescribed, if needed.  If narcotic pain medicine is not needed, then you may take acetaminophen (Tylenol) or ibuprofen (Advil) as needed. °2. Take your usually prescribed medications unless otherwise directed °3. If you need a refill on your pain medication, please contact your pharmacy.  They will contact our office to request authorization.  Prescriptions will not be filled after 5pm or on week-ends. °4. You should eat very light the first 24 hours after surgery, such as soup, crackers, pudding, etc.  Resume your normal diet the day after surgery. °5. Most patients will experience some swelling and bruising in the breast.  Ice packs and a good support bra will help.  Swelling and bruising can take several days to resolve.  °6. It is common to experience some constipation if taking pain medication after surgery.  Increasing fluid intake and taking a stool softener will usually help or prevent this problem from occurring.  A mild laxative (Milk of Magnesia or Miralax) should be taken according to package directions if there are no bowel movements after 48 hours. °7. Unless discharge instructions indicate otherwise, you may remove your bandages 24-48 hours after surgery, and you may shower at that time.  You may have steri-strips (small skin tapes) in place directly over the incision.  These strips should be left on the skin for 7-10 days.  If your surgeon used skin glue on the incision, you may shower in 24 hours.  The glue will flake off over the  next 2-3 weeks.  Any sutures or staples will be removed at the office during your follow-up visit. °8. ACTIVITIES:  You may resume regular daily activities (gradually increasing) beginning the next day.  Wearing a good support bra or sports bra minimizes pain and swelling.  You may have sexual intercourse when it is comfortable. °a. You may drive when you no longer are taking prescription pain medication, you can comfortably wear a seatbelt, and you can safely maneuver your car and apply brakes. °b. RETURN TO WORK:  ______________________________________________________________________________________ °9. You should see your doctor in the office for a follow-up appointment approximately two weeks after your surgery.  Your doctor’s nurse will typically make your follow-up appointment when she calls you with your pathology report.  Expect your pathology report 2-3 business days after your surgery.  You may call to check if you do not hear from us after three days. °10. OTHER INSTRUCTIONS: _______________________________________________________________________________________________ _____________________________________________________________________________________________________________________________________ °_____________________________________________________________________________________________________________________________________ °_____________________________________________________________________________________________________________________________________ ° °WHEN TO CALL YOUR DOCTOR: °1. Fever over 101.0 °2. Nausea and/or vomiting. °3. Extreme swelling or bruising. °4. Continued bleeding from incision. °5. Increased pain, redness, or drainage from the incision. ° °The clinic staff is available to answer your questions during regular business hours.  Please don’t hesitate to call and ask to speak to one of the nurses for clinical concerns.  If you have a medical emergency, go to the nearest  emergency room or call 911.  A surgeon from [facility] is always on call at the hospital. ° °For further questions, please visit [email or web address]  ° ° ° ° °  Post Anesthesia Home Care Instructions ° °Activity: °Get plenty of rest for the remainder of the day. A responsible individual must stay with you for 24 hours following the procedure.  °For the next 24 hours, DO NOT: °-Drive a car °-Operate machinery °-Drink alcoholic beverages °-Take any medication unless instructed by your physician °-Make any legal decisions or sign important papers. ° °Meals: °Start with liquid foods such as gelatin or soup. Progress to regular foods as tolerated. Avoid greasy, spicy, heavy foods. If nausea and/or vomiting occur, drink only clear liquids until the nausea and/or vomiting subsides. Call your physician if vomiting continues. ° °Special Instructions/Symptoms: °Your throat may feel dry or sore from the anesthesia or the breathing tube placed in your throat during surgery. If this causes discomfort, gargle with warm salt water. The discomfort should disappear within 24 hours. ° °If you had a scopolamine patch placed behind your ear for the management of post- operative nausea and/or vomiting: ° °1. The medication in the patch is effective for 72 hours, after which it should be removed.  Wrap patch in a tissue and discard in the trash. Wash hands thoroughly with soap and water. °2. You may remove the patch earlier than 72 hours if you experience unpleasant side effects which may include dry mouth, dizziness or visual disturbances. °3. Avoid touching the patch. Wash your hands with soap and water after contact with the patch. °  ° °

## 2016-08-31 NOTE — Anesthesia Procedure Notes (Signed)
Procedure Name: LMA Insertion Date/Time: 08/31/2016 11:05 AM Performed by: Lieutenant Diego Pre-anesthesia Checklist: Patient identified, Emergency Drugs available, Suction available and Patient being monitored Patient Re-evaluated:Patient Re-evaluated prior to inductionOxygen Delivery Method: Circle system utilized Preoxygenation: Pre-oxygenation with 100% oxygen Intubation Type: IV induction Ventilation: Mask ventilation without difficulty LMA: LMA inserted LMA Size: 4.0 Number of attempts: 1 Airway Equipment and Method: Bite block Placement Confirmation: positive ETCO2 and breath sounds checked- equal and bilateral Tube secured with: Tape Dental Injury: Teeth and Oropharynx as per pre-operative assessment

## 2016-08-31 NOTE — Anesthesia Preprocedure Evaluation (Addendum)
Anesthesia Evaluation  Patient identified by MRN, date of birth, ID band Patient awake    Reviewed: Allergy & Precautions, NPO status , Patient's Chart, lab work & pertinent test results  Airway Mallampati: III  TM Distance: >3 FB Neck ROM: Full    Dental no notable dental hx. (+) Teeth Intact   Pulmonary neg pulmonary ROS,    Pulmonary exam normal breath sounds clear to auscultation       Cardiovascular hypertension, Pt. on medications Normal cardiovascular exam Rhythm:Regular Rate:Normal     Neuro/Psych Glaucoma negative psych ROS   GI/Hepatic negative GI ROS, Neg liver ROS,   Endo/Other  Left breast mass Obesity  Renal/GU negative Renal ROS  negative genitourinary   Musculoskeletal negative musculoskeletal ROS (+)   Abdominal (+) + obese,   Peds  Hematology  (+) anemia ,   Anesthesia Other Findings   Reproductive/Obstetrics                            Anesthesia Physical Anesthesia Plan  ASA: II  Anesthesia Plan: General   Post-op Pain Management:    Induction: Intravenous  PONV Risk Score and Plan: 4 or greater and Ondansetron, Dexamethasone, Propofol and Midazolam  Airway Management Planned:   Additional Equipment:   Intra-op Plan:   Post-operative Plan: Extubation in OR  Informed Consent: I have reviewed the patients History and Physical, chart, labs and discussed the procedure including the risks, benefits and alternatives for the proposed anesthesia with the patient or authorized representative who has indicated his/her understanding and acceptance.   Dental advisory given  Plan Discussed with: Anesthesiologist, Surgeon and CRNA  Anesthesia Plan Comments:         Anesthesia Quick Evaluation

## 2016-08-31 NOTE — Op Note (Signed)
Patient Name:           Alexandra Beasley   Date of Surgery:        08/31/2016  Pre op Diagnosis:      Complex sclerosing lesion left breast, lower inner quadrant  Post op Diagnosis:    Same  Procedure:                 Left breast lumpectomy with radioactive seed localization and margin assessment  Surgeon:                     Edsel Petrin. Dalbert Batman, M.D., FACS  Assistant:                      OR staff   Indication for Assistant: n/a  Operative Indications:   This is a 51 year old [place] woman, referred by Dr. Autumn Patty at the [facility] for evaluation of complex sclerosing lesion left breast. Dr. Sharon Seller is her PCP.      She has no prior history of breast problems. Gets screening mammograms. Recent screening mammogram showed category B density, focal area of suspicious distortion in the left breast, lower, slightly inner quadrant. Ultrasound showed no mass or axillary adenopathy. Stereotactic core biopsy shows complex sclerosing lesion.       Family history is negative for breast or ovarian cancer.      I told the patient and her children about CSL. They are aware that most likely she does not have cancer but there is a 5-10% chance of an upgrade on complete excision. I told them that complete excision with radioactive seed localization is indicated she would like to do that. She'll be scheduled for left breast lumpectomy with radioactive seed localization in the near future.They agree with this plan.  Operative Findings:       The marker clip and radioactive seed were very close to each other in the lower breast, slightly medially.  This was close enough to the areolar margin to where I could make an areolar margin incision, hidden scar technique.  The specimen mammogram looked very good with the marker clip and radioactive seed in the center of the specimen.  Procedure in Detail:          Following the induction of general LMA anesthesia the patient's left breast  was prepped and draped in a sterile fashion.  Surgical timeout was performed.  Intravenous antibiotics were given.  0.5% Marcaine with epinephrine was used as a local infiltration anesthetic.     Using the neoprobe as a guide designed and then made a circumareolar incision at the inferior pole of the areolar margin a knife was used to make the incision.  The lumpectomy was performed with the neoprobe and electrocautery.  The specimen was removed and marked with silk sutures and the 6 color ink kit to orient the pathologist.  Specimen mammogram looked very good as discussed above.  The wound was irrigated.  Hemostasis was excellent.  The lumpectomy cavity was marked with metal clips.  The breast tissues were closed in 2 separate layers with interrupted sutures of 3-0 Vicryl.  The skin was closed with a running subcuticular 4-0 Monocryl and Dermabond.  Dry bandages and a breast binder were placed.  The patient tolerated the procedure well was taken to PACU in stable condition.  EBL 20 mL.  Counts correct.  Complications none.     Edsel Petrin. Dalbert Batman, M.D., [place] [facility]  Minimally Invasive Surgery Breast and Colorectal Surgery  Addendum: I logged on to the [facility] website and reviewed her prescription medication history.  08/31/2016 11:56 AM

## 2016-08-31 NOTE — Anesthesia Postprocedure Evaluation (Signed)
Anesthesia Post Note  Patient: Alexandra Beasley  Procedure(s) Performed: Procedure(s) (LRB): BREAST LUMPECTOMY WITH RADIOACTIVE SEED LOCALIZATION (Left)     Patient location during evaluation: PACU Anesthesia Type: General Level of consciousness: awake and alert and oriented Pain management: pain level controlled Vital Signs Assessment: post-procedure vital signs reviewed and stable Respiratory status: spontaneous breathing, nonlabored ventilation and respiratory function stable Cardiovascular status: blood pressure returned to baseline and stable Postop Assessment: no signs of nausea or vomiting Anesthetic complications: no    Last Vitals:  Vitals:   08/31/16 1215 08/31/16 1230  BP: 106/64 113/60  Pulse: 89 85  Resp: (!) 25 20  Temp:      Last Pain:  Vitals:   08/31/16 1230  TempSrc:   PainSc: (P) 5                  Nahiem Dredge A.

## 2016-08-31 NOTE — Interval H&P Note (Signed)
History and Physical Interval Note:  08/31/2016 9:06 AM  Alexandra Beasley  has presented today for surgery, with the diagnosis of ABNORMAL MAMMOGRAM L BREAST  The various methods of treatment have been discussed with the patient and family. After consideration of risks, benefits and other options for treatment, the patient has consented to  Procedure(s): BREAST LUMPECTOMY WITH RADIOACTIVE SEED LOCALIZATION (Left) as a surgical intervention .  The patient's history has been reviewed, patient examined, no change in status, stable for surgery.  I have reviewed the patient's chart and labs.  Questions were answered to the patient's satisfaction.     Adin Hector

## 2016-08-31 NOTE — Transfer of Care (Signed)
Immediate Anesthesia Transfer of Care Note  Patient: Alexandra Beasley  Procedure(s) Performed: Procedure(s): BREAST LUMPECTOMY WITH RADIOACTIVE SEED LOCALIZATION (Left)  Patient Location: PACU  Anesthesia Type:General  Level of Consciousness: sedated  Airway & Oxygen Therapy: Patient Spontanous Breathing and Patient connected to face mask oxygen  Post-op Assessment: Report given to RN and Post -op Vital signs reviewed and stable  Post vital signs: Reviewed and stable  Last Vitals:  Vitals:   08/31/16 0915  BP: 118/67  Pulse: 88  Resp: 20  Temp: 36.8 C    Last Pain:  Vitals:   08/31/16 0915  TempSrc: Oral         Complications: No apparent anesthesia complications

## 2016-09-01 ENCOUNTER — Encounter (HOSPITAL_BASED_OUTPATIENT_CLINIC_OR_DEPARTMENT_OTHER): Payer: Self-pay | Admitting: General Surgery

## 2016-09-02 NOTE — Addendum Note (Signed)
Addendum  created 09/02/16 0855 by Tawni Millers, CRNA   Charge Capture section accepted

## 2016-09-02 NOTE — Progress Notes (Signed)
Inform patient of Pathology report,. Left me know that you contact her or her family.

## 2016-09-12 ENCOUNTER — Telehealth (HOSPITAL_COMMUNITY): Payer: Self-pay | Admitting: *Deleted

## 2016-09-12 NOTE — Telephone Encounter (Signed)
Patient's Daughter called today with questions about [facility] covering the anesthesia for patient's surgery on 08/31/2016. Explained that [facility] will only cover $325 towards the anesthesia and that will need a copy of the bill to pay. Let her know she can fax or mail. Gave her fax number and mailing address. Told her that she will need to contact the provider to discuss the remaining balance. Patient's daughter verbalized understanding.

## 2016-09-14 ENCOUNTER — Ambulatory Visit: Payer: Self-pay | Admitting: Family Medicine

## 2016-09-15 ENCOUNTER — Telehealth: Payer: Self-pay | Admitting: Family Medicine

## 2016-09-15 NOTE — Telephone Encounter (Signed)
Phoned and spoke with daughter Uvaldo Bristle and at the request of Dr. 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 Advised to keep follow-up with Dr. Dalbert Batman next month. She verbalized understanding.   Carroll Sage. Kenton Kingfisher, MSN, FNP-C [facility]  [address] [place], [place] [address] [phone]

## 2016-10-03 ENCOUNTER — Ambulatory Visit (INDEPENDENT_AMBULATORY_CARE_PROVIDER_SITE_OTHER): Payer: Medicaid Other | Admitting: Family Medicine

## 2016-10-03 ENCOUNTER — Encounter: Payer: Self-pay | Admitting: Family Medicine

## 2016-10-03 VITALS — BP 124/68 | HR 71 | Temp 97.9°F | Resp 12 | Ht 64.0 in | Wt 187.0 lb

## 2016-10-03 DIAGNOSIS — M25512 Pain in left shoulder: Secondary | ICD-10-CM | POA: Diagnosis not present

## 2016-10-03 MED ORDER — DICLOFENAC SODIUM 1 % TD GEL: 4.0000 g | Freq: Four times a day (QID) | TRANSDERMAL | 2 refills | Status: DC

## 2016-10-03 MED FILL — VOLTAREN 1% GEL: 1 | 6 days supply | Qty: 100 | Fill #0 | Status: TO

## 2016-10-03 NOTE — Progress Notes (Signed)
Patient ID: Alexandra Beasley, female    DOB: 28-Aug-1965, 51 y.o.   MRN: [id number]  PCP: Scot Jun, FNP  Chief Complaint  Patient presents with  . Follow-up    SHOULDER PAIN     Subjective:  HPI Alexandra Beasley is a 51 y.o. female presents for evaluation of ongoing left shoulder pain.  Alexandra Beasley was seen in clinic for a compliant left shoulder pain on 07/04/2016. She was treated for carpal tunnel and left arm pain resolved, however shoulder pain persisted. Describes pain as aching and radiating into wrist and fingers. Complains of intermittent numbness of the left hand and fingers. She has been treating pain with topical Voltaren with minimal relief. Alexandra Beasley has recently undergone a left breast lumpectomy with radioactive seed localization 08/31/2016, however patient has complained of pain prior to this procedure.  Social History   Social History  . Marital status: Married    Spouse name: N/A  . Number of children: N/A  . Years of education: N/A   Occupational History  . Not on file.   Social History Main Topics  . Smoking status: Never Smoker  . Smokeless tobacco: Never Used  . Alcohol use No  . Drug use: No  . Sexual activity: No   Other Topics Concern  . Not on file   Social History Narrative  . No narrative on file    Family History  Problem Relation Age of Onset  . Vision loss Mother   . Hypertension Mother   . Heart disease Father    Review of Systems See history of present illness Patient Active Problem List   Diagnosis Date Noted  . Abnormal mammogram of left breast 08/31/2016  . Glaucoma 06/04/2015  . Pain in joint, shoulder region 06/04/2015    No Known Allergies  Prior to Admission medications   Medication Sig Start Date End Date Taking? Authorizing Provider  diclofenac sodium (VOLTAREN) 1 % GEL Apply 4 g topically 4 (four) times daily. 08/04/16  Yes Scot Jun, FNP  lisinopril-hydrochlorothiazide (PRINZIDE,ZESTORETIC) 20-25 MG  tablet Take 1 tablet by mouth daily. 12/28/15  Yes Micheline Chapman, NP  Multiple Vitamin (MULTIVITAMIN WITH MINERALS) TABS tablet Take 1 tablet by mouth daily.   Yes [provider]  HYDROcodone-acetaminophen (NORCO) 5-325 MG tablet Take 1-2 tablets by mouth every 6 (six) hours as needed for moderate pain or severe pain. Patient not taking: Reported on 10/03/2016 08/31/16   Fanny Skates, MD  ibuprofen (ADVIL,MOTRIN) 600 MG tablet Take 1 tablet (600 mg total) by mouth every 8 (eight) hours as needed. Patient not taking: Reported on 10/03/2016 07/04/16   Scot Jun, FNP  latanoprost (XALATAN) 0.005 % ophthalmic solution Place 1 drop into both eyes daily.    [provider]    Past Medical, Surgical Family and Social History reviewed and updated.    Objective:   Today's Vitals   10/03/16 1043  BP: 124/68  Pulse: 71  Resp: 12  Temp: 97.9 F (36.6 C)  TempSrc: Oral  SpO2: 98%  Weight: 187 lb (84.8 kg)  Height: 5\' 4"  (1.626 m)    Wt Readings from Last 3 Encounters:  10/03/16 187 lb (84.8 kg)  08/31/16 182 lb (82.6 kg)  08/03/16 182 lb (82.6 kg)    Physical Exam  Constitutional: She is oriented to person, place, and time. She appears well-developed and well-nourished.  HENT:  Head: Normocephalic and atraumatic.  Neck: Normal range of motion. Neck supple.  Cardiovascular: Normal rate, regular  rhythm, normal heart sounds and intact distal pulses.   Pulmonary/Chest: Effort normal and breath sounds normal.  Musculoskeletal: She exhibits tenderness.  Left shoulder: Range of motion with adduction, limited range of motion motion with abduction, limited range of motion with internal rotation and external rotation. Reports increased tenderness with of range of motion Pain most pronounced at bursa and acromium  Neurological: She is alert and oriented to person, place, and time.  Skin: Skin is warm and dry.  Psychiatric: She has a normal mood and affect. Her  behavior is normal. Judgment and thought content normal.   Assessment & Plan:  1. Acute pain of left shoulder - DG Shoulder Left - Ambulatory referral to Orthopedic Surgery Patient and family greatly concerned regarding cost of imaging and orthopedic referral. Patient would like to wait a day to decide about imaging and discussed cost of referral. -Diclofenac gel refilled milligrams 4 times daily as needed for pain.  RTC: As needed or if symptoms do not improve.   Carroll Sage. Kenton Kingfisher, MSN, FNP-C [facility]  [address] Barbara Cower [place], [place] [address] [phone]

## 2016-10-03 NOTE — Patient Instructions (Signed)
Please schedule a follow-up with Opthalmology in order to have your eye pressure checked and Glaucoma evaluated. [employer] card doesn't cover these services.  I am referring you to orthopedics for evaluation of left shoulder pain. [employer] card at present are not covering any orthopedic referrals therefore, you will have to pay out of pocket.   Please go over the [facility] to obtain an x-ray for left shoulder. You may be billed for this image if test is not covered by orange card.     Shoulder Pain Many things can cause shoulder pain, including:  An injury.  Moving the arm in the same way again and again (overuse).  Joint pain (arthritis).  Follow these instructions at home: Take these actions to help with your pain:  Squeeze a soft ball or a foam pad as much as you can. This helps to prevent swelling. It also makes the arm stronger.  Take over-the-counter and prescription medicines only as told by your doctor.  If told, put ice on the area: ? Put ice in a plastic bag. ? Place a towel between your skin and the bag. ? Leave the ice on for 20 minutes, 2-3 times per day. Stop putting on ice if it does not help with the pain.  If you were given a shoulder sling or immobilizer: ? Wear it as told. ? Remove it to shower or bathe. ? Move your arm as little as possible. ? Keep your hand moving. This helps prevent swelling.  Contact a doctor if:  Your pain gets worse.  Medicine does not help your pain.  You have new pain in your arm, hand, or fingers. Get help right away if:  Your arm, hand, or fingers: ? Tingle. ? Are numb. ? Are swollen. ? Are painful. ? Turn white or blue. This information is not intended to replace advice given to you by your health care provider. Make sure you discuss any questions you have with your health care provider. Document Released: 08/24/2007 Document Revised: 11/01/2015 Document Reviewed: 06/30/2014 Elsevier Interactive Patient  Education  [employer].

## 2016-10-07 ENCOUNTER — Ambulatory Visit: Payer: Self-pay

## 2016-10-10 ENCOUNTER — Telehealth (HOSPITAL_COMMUNITY): Payer: Self-pay | Admitting: *Deleted

## 2016-10-10 ENCOUNTER — Ambulatory Visit: Payer: Medicaid Other | Attending: Internal Medicine

## 2016-10-10 NOTE — Telephone Encounter (Signed)
Patient's daughter called after receiving bills for patient's breast surgery. Called patient back and discussed that patient is eligible for [employer] Medicaid due to her pathology result. Told patient's daughter that her mother will need to come to the [place] to complete her [employer] Medicaid paperwork and that it will go retroactive back to cover her surgery. Let her know we will be here today and Wednesday if she is available to complete paperwork. Patient's daughter verbalized understanding.

## 2016-10-19 ENCOUNTER — Encounter: Payer: Self-pay | Admitting: Oncology

## 2016-10-19 ENCOUNTER — Telehealth: Payer: Self-pay | Admitting: Oncology

## 2016-10-19 NOTE — Telephone Encounter (Signed)
Appt has been scheduled for the pt to see Dr. Jana Hakim on 8/20 at 4pm. Appt given to the pt's daughter and aware to arrive 30 minutes early. Address verified. Phone number to financial counselor given. Letter mailed.

## 2016-10-31 ENCOUNTER — Ambulatory Visit (INDEPENDENT_AMBULATORY_CARE_PROVIDER_SITE_OTHER): Payer: Self-pay | Admitting: Orthopaedic Surgery

## 2016-11-03 ENCOUNTER — Telehealth: Payer: Self-pay

## 2016-11-04 NOTE — Telephone Encounter (Signed)
Referral was sent to Dr. Katy Fitch eye care on  11/04/2016.

## 2016-11-07 ENCOUNTER — Ambulatory Visit (HOSPITAL_BASED_OUTPATIENT_CLINIC_OR_DEPARTMENT_OTHER): Payer: Medicaid Other | Admitting: Oncology

## 2016-11-07 ENCOUNTER — Encounter (INDEPENDENT_AMBULATORY_CARE_PROVIDER_SITE_OTHER): Payer: Self-pay

## 2016-11-07 DIAGNOSIS — N6092 Unspecified benign mammary dysplasia of left breast: Secondary | ICD-10-CM | POA: Diagnosis present

## 2016-11-07 MED ORDER — ANASTROZOLE 1 MG PO TABS: 1.0000 mg | ORAL_TABLET | Freq: Every day | ORAL | 4 refills | Status: DC

## 2016-11-07 NOTE — Progress Notes (Signed)
[facility]  Telephone:([phone] Fax:([phone]) [phone]     ID: Alexandra Beasley DOB: [birth year]  MR#: [id number]  DOD#:[id number]  Patient Care Team: Bing Neighbors, FNP as PCP - General (Family Medicine) Cathi Hazan, Valentino Hue, MD as Consulting Physician (Oncology) Claud Kelp, MD as Consulting Physician (General Surgery) Lowella Dell, MD OTHER MD:  CHIEF COMPLAINT:   CURRENT TREATMENT:    HISTORY OF CURRENT ILLNESS: "Alexandra Beasley" [NEE[facility]lam] had bilateral screening mammography for 12/08/2016 at the breast Center. There was a possible distortion in the left breast, and on 07/21/2016 she'll she underwent left diagnostic mammography with tomography and left breast ultrasonography. The breast density was category B. This confirmed an area of distortion in the lower inner left breast which was not palpable. Ultrasonography found no definite correlate. The axilla was negative.  Biopsy of this area may 07/08/2016 showed ([id number]) a complex sclerosing lesion. Accordingly the patient was referred to surgery and on 08/31/2016 underwent left lumpectomy, which showed atypical lobular hyperplasia ([id number]). She was then referred to the high risk clinic. There has been some delay because of [facility] approval issues  The patient's subsequent history is as detailed below.  INTERVAL HISTORY: Alexandra Beasley was evaluated in the breast clinic 11/07/2016 accompanied by her daughter Alexandra Beasley.  REVIEW OF SYSTEMS: A detailed review of systems today was entirely benign  PAST MEDICAL HISTORY: Past Medical History:  Diagnosis Date  . Abnormal mammogram of left breast 08/31/2016  . Breast mass, left   . Glaucoma   . Hypertension     PAST SURGICAL HISTORY: Past Surgical History:  Procedure Laterality Date  . ABDOMINAL HYSTERECTOMY    . BREAST LUMPECTOMY WITH RADIOACTIVE SEED LOCALIZATION Left 08/31/2016   Procedure: BREAST LUMPECTOMY WITH RADIOACTIVE SEED LOCALIZATION;  Surgeon: Claud Kelp, MD;  Location: [facility];  Service: General;  Laterality: Left;  . CESAREAN SECTION      FAMILY HISTORY Family History  Problem Relation Age of Onset  . Vision loss Mother   . Hypertension Mother   . Heart disease Father   The patient's father is 6 years old and her mother 63 years old as of August 2018. The patient has one brother and one sister. There is no history of breast or ovarian cancer in the family.  GYNECOLOGIC HISTORY:  No LMP recorded. Patient has had a hysterectomy. Menarche age 31, first live birth age 71, the patient is GX P2. She is status post remote hysterectomy, without salpingo-oophorectomy  SOCIAL HISTORY:  She is originally from [place]. She is married, her husband has a history of head and neck cancer and is disabled as a result. He is followed to [facility]. The patient's daughter Alexandra Beasley on [employer]. The patient's son Alexandra Beasley is studying pharmacy at [employer]. The patient has one granddaughter. She attends a local [employer]    ADVANCED DIRECTIVES:    HEALTH MAINTENANCE: Social History  Substance Use Topics  . Smoking status: Never Smoker  . Smokeless tobacco: Never Used  . Alcohol use No     Colonoscopy:Never  PAP: Status post hysterectomy  Bone density: Never   No Known Allergies  Current Outpatient Prescriptions  Medication Sig Dispense Refill  . diclofenac sodium (VOLTAREN) 1 % GEL Apply 4 g topically 4 (four) times daily. 100 g 2  . latanoprost (XALATAN) 0.005 % ophthalmic solution Place 1 drop into both eyes daily.    Marland Kitchen lisinopril-hydrochlorothiazide (PRINZIDE,ZESTORETIC) 20-25 MG tablet Take 1 tablet by mouth daily. 90 tablet  1  . Multiple Vitamin (MULTIVITAMIN WITH MINERALS) TABS tablet Take 1 tablet by mouth daily.     No current facility-administered medications for this visit.     OBJECTIVE: Middle-aged Alexandra Beasley woman who appears well  Vitals:   11/07/16 1613  BP: (!) 112/52  Pulse: 85  Resp:  18  Temp: 98.7 F (37.1 C)  SpO2: 100%     Body mass index is 32.17 kg/m.   Wt Readings from Last 3 Encounters:  11/07/16 187 lb 6.4 oz (85 kg)  10/03/16 187 lb (84.8 kg)  08/31/16 182 lb (82.6 kg)      ECOG FS:0 - Asymptomatic  Ocular: Sclerae unicteric, pupils round and equal Ear-nose-throat: Oropharynx clear and moist Lymphatic: No cervical or supraclavicular adenopathy Lungs no rales or rhonchi Heart regular rate and rhythm Abd soft, nontender, positive bowel sounds MSK no focal spinal tenderness, no joint edema Neuro: non-focal, well-oriented, appropriate affect Breasts: The right breast is unremarkable. The left breast is status post recent lumpectomy. The cosmetic result is excellent. There is minimal induration associated with the scar. There is no evidence of residual or recurrent disease. Both axillae are benign.   LAB RESULTS:  CMP     Component Value Date/Time   NA 137 08/25/2016 1130   K 4.2 08/25/2016 1130   CL 109 08/25/2016 1130   CO2 22 08/25/2016 1130   GLUCOSE 96 08/25/2016 1130   BUN 9 08/25/2016 1130   CREATININE 0.54 08/25/2016 1130   CREATININE 0.47 (L) 07/04/2016 1115   CALCIUM 9.0 08/25/2016 1130   PROT 6.5 08/25/2016 1130   ALBUMIN 3.3 (L) 08/25/2016 1130   AST 30 08/25/2016 1130   ALT 28 08/25/2016 1130   ALKPHOS 68 08/25/2016 1130   BILITOT 0.8 08/25/2016 1130   GFRNONAA >60 08/25/2016 1130   GFRNONAA >89 07/04/2016 1115   GFRAA >60 08/25/2016 1130   GFRAA >89 07/04/2016 1115    No results found for: TOTALPROTELP, ALBUMINELP, A1GS, A2GS, BETS, BETA2SER, GAMS, MSPIKE, SPEI  No results found for: Alexandra Beasley, KAPLAMBRATIO  Lab Results  Component Value Date   WBC 7.8 08/25/2016   NEUTROABS 5.5 08/25/2016   HGB 11.1 (L) 08/25/2016   HCT 35.1 (L) 08/25/2016   MCV 85.4 08/25/2016   PLT 227 08/25/2016      Chemistry      Component Value Date/Time   NA 137 08/25/2016 1130   K 4.2 08/25/2016 1130   CL 109 08/25/2016  1130   CO2 22 08/25/2016 1130   BUN 9 08/25/2016 1130   CREATININE 0.54 08/25/2016 1130   CREATININE 0.47 (L) 07/04/2016 1115      Component Value Date/Time   CALCIUM 9.0 08/25/2016 1130   ALKPHOS 68 08/25/2016 1130   AST 30 08/25/2016 1130   ALT 28 08/25/2016 1130   BILITOT 0.8 08/25/2016 1130       No results found for: LABCA2  No components found for: ZDGUYQ034  No results for input(s): INR in the last 168 hours.  No results found for: LABCA2  No results found for: VQQ595  No results found for: GLO756  No results found for: EPP295  No results found for: CA2729  No components found for: HGQUANT  No results found for: CEA1 / No results found for: CEA1   No results found for: AFPTUMOR  No results found for: CHROMOGRNA  No results found for: PSA1  No visits with results within 3 Day(s) from this visit.  Latest known visit with results  is:  Admission on 08/31/2016, Discharged on 08/31/2016  Component Date Value Ref Range Status  . WBC 08/25/2016 7.8  4.0 - 10.5 K/uL Final  . RBC 08/25/2016 4.11  3.87 - 5.11 MIL/uL Final  . Hemoglobin 08/25/2016 11.1* 12.0 - 15.0 g/dL Final  . HCT 21/81/9682 35.1* 36.0 - 46.0 % Final  . MCV 08/25/2016 85.4  78.0 - 100.0 fL Final  . MCH 08/25/2016 27.0  26.0 - 34.0 pg Final  . MCHC 08/25/2016 31.6  30.0 - 36.0 g/dL Final  . RDW 79/96/5738 15.2  11.5 - 15.5 % Final  . Platelets 08/25/2016 227  150 - 400 K/uL Final  . Neutrophils Relative % 08/25/2016 71  % Final  . Neutro Abs 08/25/2016 5.5  1.7 - 7.7 K/uL Final  . Lymphocytes Relative 08/25/2016 18  % Final  . Lymphs Abs 08/25/2016 1.4  0.7 - 4.0 K/uL Final  . Monocytes Relative 08/25/2016 8  % Final  . Monocytes Absolute 08/25/2016 0.6  0.1 - 1.0 K/uL Final  . Eosinophils Relative 08/25/2016 3  % Final  . Eosinophils Absolute 08/25/2016 0.2  0.0 - 0.7 K/uL Final  . Basophils Relative 08/25/2016 0  % Final  . Basophils Absolute 08/25/2016 0.0  0.0 - 0.1 K/uL Final  .  Sodium 08/25/2016 137  135 - 145 mmol/L Final  . Potassium 08/25/2016 4.2  3.5 - 5.1 mmol/L Final  . Chloride 08/25/2016 109  101 - 111 mmol/L Final  . CO2 08/25/2016 22  22 - 32 mmol/L Final  . Glucose, Bld 08/25/2016 96  65 - 99 mg/dL Final  . BUN 04/19/1749 9  6 - 20 mg/dL Final  . Creatinine, Ser 08/25/2016 0.54  0.44 - 1.00 mg/dL Final  . Calcium 20/47/3725 9.0  8.9 - 10.3 mg/dL Final  . Total Protein 08/25/2016 6.5  6.5 - 8.1 g/dL Final  . Albumin 66/90/5039 3.3* 3.5 - 5.0 g/dL Final  . AST 97/13/7345 30  15 - 41 U/L Final  . ALT 08/25/2016 28  14 - 54 U/L Final  . Alkaline Phosphatase 08/25/2016 68  38 - 126 U/L Final  . Total Bilirubin 08/25/2016 0.8  0.3 - 1.2 mg/dL Final  . GFR calc non Af Amer 08/25/2016 >60  >60 mL/min Final  . GFR calc Af Amer 08/25/2016 >60  >60 mL/min Final   Comment: (NOTE) The eGFR has been calculated using the CKD EPI equation. This calculation has not been validated in all clinical situations. eGFR's persistently <60 mL/min signify possible Chronic Kidney Disease.   . Anion gap 08/25/2016 6  5 - 15 Final    (this displays the last labs from the last 3 days)  No results found for: TOTALPROTELP, ALBUMINELP, A1GS, A2GS, BETS, BETA2SER, GAMS, MSPIKE, SPEI (this displays SPEP labs)  No results found for: KPAFRELGTCHN, LAMBDASER, KAPLAMBRATIO (kappa/lambda light chains)  No results found for: HGBA, HGBA2QUANT, HGBFQUANT, HGBSQUAN (Hemoglobinopathy evaluation)   No results found for: LDH  No results found for: IRON, TIBC, IRONPCTSAT (Iron and TIBC)  No results found for: FERRITIN  Urinalysis    Component Value Date/Time   LABSPEC 1.025 07/04/2016 1118   PHURINE 6.0 07/04/2016 1118   GLUCOSEU NEGATIVE 07/04/2016 1118   HGBUR NEGATIVE 07/04/2016 1118   BILIRUBINUR NEGATIVE 07/04/2016 1118   KETONESUR NEGATIVE 07/04/2016 1118   PROTEINUR NEGATIVE 07/04/2016 1118   UROBILINOGEN 0.2 07/04/2016 1118   NITRITE NEGATIVE 07/04/2016 1118    LEUKOCYTESUR NEGATIVE 07/04/2016 1118     STUDIES: Pathology results  discussed with the patient and she was given a copy of the report  ELIGIBLE FOR AVAILABLE RESEARCH PROTOCOL:no  ASSESSMENT: 51 y.o. Alexandra Beasley woman status post left breast lumpectomy 08/31/2016 for atypical lobular hyperplasia  PLAN: We spent the better part of today's 50-minute appointment discussing the biology of breast cancer in general, and the specifics of atypical lobular hyperplasia [ALH] more specifically. Alexandra Beasley understands ALH means, first, relatively unrestricted growth of breast cells and, in addition, some morphologically different features from simple hyperplasia. Atypical lobular hyperplasia is not breast cancer but can be associated with invasive cancer and for that reason it needs to be removed.  Luckily in her case no invasive disease was noted. However ALH is also a marker of breast cancer risk. The risk of developing breast cancer in patients with ALH falls somewhere between one half and 1% per year. The new cancer can develop in either breast. One half of those tumors would be invasive.  Working from the patient's age to the average survival of women today in the [place] I think Alexandra Beasley can expect to live an additional 25-30 years. Using the high end of the risk spectrum for purposes of discussion, this would give her a 20-30% chance of developing breast cancer in her lifetime. This is approximately 3 times the normal risk.  Alexandra Beasley has essentially two ways of lowering that risk. One of them is bilateral mastectomies. This is not recommended for this indication and I do not believe it would be covered by insurance even if she wanted it which she doesn't. The other, more reasonable approach would be to take anti-estrogens. This could be tamoxifen, raloxifene/Evista, or an aromatase inhibitor  We then discussed the possible toxicities, side effects and complications of the tamoxifen group as opposed to the aromatase  inhibitors. All information was given to Alexandra Beasley in writing. Any of those agents if taken for 5 years would essentially cut the risk of developing a new breast cancer in half.  My recommendation is that she start with anastrozole and she tolerates it well continue for a total of 5 years.I went ahead and placed the prescription in for her. She will let me know if at her pharmacy they asked for more than $10 per month for this prescription.  She will see me again in approximately 3 months. I will check hormone levels at that time and if all goes well I will start seeing her yearly basis thereafter.  Kaislee has a good understanding of the overall plan. She agrees with it. She knows the goal of treatment in her case isprevention  She will call with any problems that may develop before her next visit here.  Chauncey Cruel, MD   11/07/2016 5:09 PM Medical Oncology and Hematology [facility] [address] [place], [place] [address] Tel. [phone]    Fax. [phone]

## 2016-11-08 ENCOUNTER — Ambulatory Visit (INDEPENDENT_AMBULATORY_CARE_PROVIDER_SITE_OTHER): Payer: Medicaid Other | Admitting: Family Medicine

## 2016-11-08 ENCOUNTER — Telehealth: Payer: Self-pay | Admitting: Oncology

## 2016-11-08 ENCOUNTER — Ambulatory Visit (HOSPITAL_COMMUNITY)
Admission: RE | Admit: 2016-11-08 | Discharge: 2016-11-08 | Disposition: A | Discharge status: Home / Self Care | Payer: Medicaid Other | Source: Ambulatory Visit | Attending: Family Medicine | Admitting: Family Medicine

## 2016-11-08 ENCOUNTER — Encounter: Payer: Self-pay | Admitting: Family Medicine

## 2016-11-08 VITALS — BP 118/68 | HR 68 | Temp 97.6°F | Resp 12 | Ht 64.0 in | Wt 188.2 lb

## 2016-11-08 DIAGNOSIS — M25571 Pain in right ankle and joints of right foot: Secondary | ICD-10-CM

## 2016-11-08 DIAGNOSIS — M25512 Pain in left shoulder: Secondary | ICD-10-CM | POA: Diagnosis present

## 2016-11-08 DIAGNOSIS — M25511 Pain in right shoulder: Secondary | ICD-10-CM

## 2016-11-08 MED ORDER — MELOXICAM 7.5 MG PO TABS: 7.5000 mg | ORAL_TABLET | Freq: Two times a day (BID) | ORAL | 0 refills | Status: DC | PRN

## 2016-11-08 NOTE — Progress Notes (Signed)
Patient ID: Alexandra Beasley, female    DOB: 11-03-1965, 51 y.o.   MRN: [id number]  PCP: Scot Jun, FNP  Chief Complaint  Patient presents with  . Follow-up    6 WEEKS    Subjective:  HPI Alexandra Beasley is a 51 y.o. female, with chronic shoulder pain, presents for follow-up and is accompanied today by her grandson who is assisting with interpretation. Shoulder continues to remain painful although pain has improved since being prescribed diclofenac gel. Alexandra Beasley has an upcoming appointment with [facility] for further evaluation of this chronic problem. She has not obtained her shoulder image ordered during last office visit, however plans to have imaging completed today. Alexandra Beasley also complains of 2 weeks of right ankle pain. No prior or recent injuries. Pain is worsened with standing and relieved mildly with application of diclofenac gel. She reports no associated swelling or erythema.  Social History   Social History  . Marital status: Married    Spouse name: N/A  . Number of children: N/A  . Years of education: N/A   Occupational History  . Not on file.   Social History Main Topics  . Smoking status: Never Smoker  . Smokeless tobacco: Never Used  . Alcohol use No  . Drug use: No  . Sexual activity: No   Other Topics Concern  . Not on file   Social History Narrative  . No narrative on file    Family History  Problem Relation Age of Onset  . Vision loss Mother   . Hypertension Mother   . Heart disease Father    Review of Systems See HPI  Patient Active Problem List   Diagnosis Date Noted  . 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 Abnormal mammogram of left breast 08/31/2016  . Glaucoma 06/04/2015  . Pain in joint, shoulder region 06/04/2015    No Known Allergies  Prior to Admission medications   Medication Sig Start Date End Date Taking? Authorizing Provider  diclofenac sodium (VOLTAREN) 1 % GEL Apply 4 g topically  4 (four) times daily. 10/03/16  Yes Scot Jun, FNP  lisinopril-hydrochlorothiazide (PRINZIDE,ZESTORETIC) 20-25 MG tablet Take 1 tablet by mouth daily. 12/28/15  Yes Micheline Chapman, NP  Multiple Vitamin (MULTIVITAMIN WITH MINERALS) TABS tablet Take 1 tablet by mouth daily.   Yes [provider]  anastrozole (ARIMIDEX) 1 MG tablet Take 1 tablet (1 mg total) by mouth daily. Patient not taking: Reported on 11/08/2016 11/07/16   Magrinat, Virgie Dad, MD  latanoprost (XALATAN) 0.005 % ophthalmic solution Place 1 drop into both eyes daily.    [provider]    Past Medical, Surgical Family and Social History reviewed and updated.    Objective:   Today's Vitals   11/08/16 0959  BP: 118/68  Pulse: 68  Resp: 12  Temp: 97.6 F (36.4 C)  TempSrc: Oral  SpO2: 98%  Weight: 188 lb 3.2 oz (85.4 kg)  Height: 5\' 4"  (1.626 m)    Wt Readings from Last 3 Encounters:  11/08/16 188 lb 3.2 oz (85.4 kg)  11/07/16 187 lb 6.4 oz (85 kg)  10/03/16 187 lb (84.8 kg)    Physical Exam  Constitutional: She is oriented to person, place, and time. She appears well-developed and well-nourished.  HENT:  Head: Normocephalic and atraumatic.  Eyes: Pupils are equal, round, and reactive to light. Conjunctivae are normal.  Neck: Normal range of motion. Neck supple.  Cardiovascular: Normal rate, regular rhythm, normal heart  sounds and intact distal pulses.   Pulmonary/Chest: Effort normal and breath sounds normal.  Musculoskeletal: She exhibits tenderness.       Right ankle: She exhibits normal range of motion, no swelling, no ecchymosis and normal pulse. Tenderness. Lateral malleolus and proximal fibula tenderness found. Achilles tendon normal.  Neurological: She is alert and oriented to person, place, and time.  Skin: Skin is warm and dry.  Psychiatric: She has a normal mood and affect. Her behavior is normal. Judgment and thought content normal.   Assessment & Plan:  1. Chronic pain  of right shoulder 2. Acute right ankle pain  For shoulder pain keep follow-up with Dr. Durward Fortes at [facility] orthopedic. Also for shoulder and ankle pain continue diclofenac gel as needed for pain. I am adding meloxicam 7.5 mg every 12 hours as needed for moderate to severe pain. You may also apply heat or cool applications to relieve shoulder and ankle pain.   RTC: 6 months for chronic disease management.  Carroll Sage. Kenton Kingfisher, MSN, FNP-C [facility]  [address] [place], [place] [address] [phone]

## 2016-11-08 NOTE — Telephone Encounter (Signed)
Scheduled appt per 8/2 los - patient to get reminder letter in the mail. - lab and f/u in November.

## 2016-11-08 NOTE — Patient Instructions (Signed)
Ankle Pain Many things can cause ankle pain, including an injury to the area and overuse of the ankle.The ankle joint holds your body weight and allows you to move around. Ankle pain can occur on either side or the back of one ankle or both ankles. Ankle pain may be sharp and burning or dull and aching. There may be tenderness, stiffness, redness, or warmth around the ankle. Follow these instructions at home: Activity  Rest your ankle as told by your health care provider. Avoid any activities that cause ankle pain.  Do exercises as told by your health care provider.  Ask your health care provider if you can drive. Using a brace, a bandage, or crutches  If you were given a brace: ? Wear it as told by your health care provider. ? Remove it when you take a bath or a shower. ? Try not to move your ankle very much, but wiggle your toes from time to time. This helps to prevent swelling.  If you were given an elastic bandage: ? Remove it when you take a bath or a shower. ? Try not to move your ankle very much, but wiggle your toes from time to time. This helps to prevent swelling. ? Adjust the bandage to make it more comfortable if it feels too tight. ? Loosen the bandage if you have numbness or tingling in your foot or if your foot turns cold and blue.  If you have crutches, use them as told by your health care provider. Continue to use them until you can walk without feeling pain in your ankle. Managing pain, stiffness, and swelling  Raise (elevate) your ankle above the level of your heart while you are sitting or lying down.  If directed, apply ice to the area: ? Put ice in a plastic bag. ? Place a towel between your skin and the bag. ? Leave the ice on for 20 minutes, 2-3 times per day. General instructions  Keep all follow-up visits as told by your health care provider. This is important.  Record this information that may be helpful for you and your health care provider: ? How  often you have ankle pain. ? Where the pain is located. ? What the pain feels like.  Take over-the-counter and prescription medicines only as told by your health care provider. Contact a health care provider if:  Your pain gets worse.  Your pain is not relieved with medicines.  You have a fever or chills.  You are having more trouble with walking.  You have new symptoms. Get help right away if:  Your foot, leg, toes, or ankle tingles or becomes numb.  Your foot, leg, toes, or ankle becomes swollen.  Your foot, leg, toes, or ankle turns pale or blue. This information is not intended to replace advice given to you by your health care provider. Make sure you discuss any questions you have with your health care provider. Document Released: 08/25/2009 Document Revised: 11/06/2015 Document Reviewed: 10/07/2014 Elsevier Interactive Patient Education  2017 [employer]  

## 2016-11-16 DIAGNOSIS — I10 Essential (primary) hypertension: Secondary | ICD-10-CM | POA: Insufficient documentation

## 2016-12-12 ENCOUNTER — Ambulatory Visit (INDEPENDENT_AMBULATORY_CARE_PROVIDER_SITE_OTHER): Payer: Medicaid Other | Admitting: Orthopaedic Surgery

## 2016-12-12 ENCOUNTER — Ambulatory Visit (INDEPENDENT_AMBULATORY_CARE_PROVIDER_SITE_OTHER): Payer: Medicaid Other

## 2016-12-12 ENCOUNTER — Encounter (INDEPENDENT_AMBULATORY_CARE_PROVIDER_SITE_OTHER): Payer: Self-pay | Admitting: Orthopaedic Surgery

## 2016-12-12 VITALS — BP 120/80 | HR 70 | Resp 12 | Ht 63.0 in | Wt 185.0 lb

## 2016-12-12 DIAGNOSIS — M25561 Pain in right knee: Secondary | ICD-10-CM

## 2016-12-12 DIAGNOSIS — G8929 Other chronic pain: Secondary | ICD-10-CM

## 2016-12-12 DIAGNOSIS — M25511 Pain in right shoulder: Secondary | ICD-10-CM | POA: Diagnosis not present

## 2016-12-12 DIAGNOSIS — M25562 Pain in left knee: Secondary | ICD-10-CM

## 2016-12-12 DIAGNOSIS — M25512 Pain in left shoulder: Secondary | ICD-10-CM | POA: Diagnosis not present

## 2016-12-12 MED ORDER — METHYLPREDNISOLONE ACETATE 40 MG/ML IJ SUSP
80.0000 mg | INTRAMUSCULAR | Status: AC | PRN
  Administered 2016-12-12: 80 mg

## 2016-12-12 MED ORDER — LIDOCAINE HCL 1 % IJ SOLN
2.0000 mL | INTRAMUSCULAR | Status: AC | PRN
  Administered 2016-12-12: 2 mL

## 2016-12-12 MED ORDER — BUPIVACAINE HCL 0.5 % IJ SOLN
2.0000 mL | INTRAMUSCULAR | Status: AC | PRN
  Administered 2016-12-12: 2 mL via INTRA_ARTICULAR

## 2016-12-12 NOTE — Progress Notes (Signed)
Office Visit Note   Patient: Alexandra Beasley           Date of Birth: [birth year]           MRN: [id number] Visit Date: 12/12/2016              Requested by: Scot Jun, [place], [place] [address] PCP: Scot Jun, FNP   Assessment & Plan: Visit Diagnoses:  1. Chronic pain of left knee   2. Chronic left shoulder pain   3. Chronic pain of right knee   4. Chronic right shoulder pain   osteoarthritis right and left knee, impingement syndrome left shoulder  Plan: long discussion regarding diagnosis of bilateral knee pain and left shoulder pain. Films are consistent with  Of both knees. Impingement syndrome could be subacromial bursitis or either partial or complete rotator cuff tear.Will inject the subacromial regionleft shoulder and monitor her response. If no improvement consider MRI scan. Continue with NSAIDs for knee pain and consider cortisone and Visco supplementation over time. Also discussed weight loss and exercises Daughter was present to interpret  Follow-Up Instructions: Return if symptoms worsen or fail to improve.   Orders:  Orders Placed This Encounter  Procedures  . Large Joint Injection/Arthrocentesis  . XR Shoulder Left  . XR KNEE 3 VIEW RIGHT  . XR KNEE 3 VIEW LEFT   No orders of the defined types were placed in this encounter.     Procedures: Large Joint Inj Date/Time: 12/12/2016 10:39 AM Performed by: Garald Balding Authorized by: Garald Balding   Consent Given by:  Patient Timeout: prior to procedure the correct patient, procedure, and site was verified   Indications:  Pain Location:  Shoulder Site:  L subacromial bursa Prep: patient was prepped and draped in usual sterile fashion   Needle Size:  25 G Needle Length:  1.5 inches Approach:  Lateral Ultrasound Guidance: No   Fluoroscopic Guidance: No   Arthrogram: No   Medications:  80 mg methylPREDNISolone acetate 40 MG/ML; 2 mL lidocaine 1 %; 2 mL bupivacaine  0.5 % Aspiration Attempted: No   Patient tolerance:  Patient tolerated the procedure well with no immediate complications     Clinical Data: No additional findings.   Subjective: Chief Complaint  Patient presents with  . Left Shoulder - Pain    Pt  Here with daughter for translation. She has L shoulder pain x 6 months. Bilateral knee pain x years.  . Right Knee - Pain  . Left Knee - Pain, Weakness, Edema  Mrs Flippo is accompanied by her daughter and here for evaluation of bilateral knee pain and left shoulder discomfort. Pain was insidious in onset but has been present for "some time. Shoulder pain is more significant. She's tried Advil or Aleve with some relief particularly at night. She does work on her feet during the day and works as a Chief Strategy Officer.no history of injury or trauma.No history of fever or chills shortness of breath or chest pain. Knee pain is described as achiness or soreness particularly when she is on her feet for long periods of time or with weather changes. Left shoulder pain as a problem with overhead activity particularly trying to raise trays overhead no history of any numbness or tingling to either upper extremity. No history of diabetes  HPI  Review of Systems  Constitutional: Negative for chills, fatigue and fever.  Eyes: Negative for itching.  Respiratory: Negative for chest tightness and shortness  of breath.   Cardiovascular: Negative for chest pain, palpitations and leg swelling.  Gastrointestinal: Negative for blood in stool, constipation and diarrhea.  Endocrine: Negative for polyuria.  Genitourinary: Negative for dysuria.  Musculoskeletal: Positive for back pain and neck stiffness. Negative for joint swelling and neck pain.  Allergic/Immunologic: Negative for immunocompromised state.  Neurological: Negative for dizziness and numbness.  Hematological: Does not bruise/bleed easily.  Psychiatric/Behavioral: The patient is not nervous/anxious.       Objective: Vital Signs: BP 120/80   Pulse 70   Resp 12   Ht 5\' 3"  (1.6 m)   Wt 185 lb (83.9 kg)   BMI 32.77 kg/m   Physical Exam  Ortho Examleft shoulder with markedly positive impingement testing. Positive empty can testing. Pain along the anterior subacromial region in the area of the supraspinatus. Skin intact.. Neurovascular exam intact to left hand.no crepitation. No referred pain to left shoulder with range of motion of cervical spine Able to place left arm overhead but with a painful arc of motion. No knee effusion. Predominantly lateral joint pain on the right and medial joint pain on the left.  Full extension. No instability. No calf pain or popliteal fullness. Straight leg raise negative bilaterally. Painless range of motion both hips in intact. Neurovascular exam intact. Awake alert and oriented 3. Language is an issue. Daughter is here for interpretation  Specialty Comments:  No specialty comments available.  Imaging: No results found.   PMFS History: Patient Active Problem List   Diagnosis Date Noted  . 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 Abnormal mammogram of left breast 08/31/2016  . Glaucoma 06/04/2015  . Pain in joint, shoulder region 06/04/2015   Past Medical History:  Diagnosis Date  . Abnormal mammogram of left breast 08/31/2016  . Breast mass, left   . Glaucoma   . Hypertension     Family History  Problem Relation Age of Onset  . Vision loss Mother   . Hypertension Mother   . Heart disease Father     Past Surgical History:  Procedure Laterality Date  . ABDOMINAL HYSTERECTOMY    . BREAST LUMPECTOMY WITH RADIOACTIVE SEED LOCALIZATION Left 08/31/2016   Procedure: BREAST LUMPECTOMY WITH RADIOACTIVE SEED LOCALIZATION;  Surgeon: Fanny Skates, MD;  Location: [facility];  Service: General;  Laterality: Left;  . CESAREAN SECTION     Social History   Occupational History  . Not on file.   Social  History Main Topics  . Smoking status: Never Smoker  . Smokeless tobacco: Never Used  . Alcohol use No  . Drug use: No  . Sexual activity: No

## 2017-01-24 ENCOUNTER — Other Ambulatory Visit (HOSPITAL_BASED_OUTPATIENT_CLINIC_OR_DEPARTMENT_OTHER): Payer: Medicaid Other

## 2017-01-24 DIAGNOSIS — N6092 Unspecified benign mammary dysplasia of left breast: Secondary | ICD-10-CM

## 2017-01-24 LAB — COMPREHENSIVE METABOLIC PANEL
ALT: 26 U/L (ref 0–55)
AST: 26 U/L (ref 5–34)
Albumin: 3.6 g/dL (ref 3.5–5.0)
Alkaline Phosphatase: 87 U/L (ref 40–150)
Anion Gap: 7 mEq/L (ref 3–11)
BILIRUBIN TOTAL: 0.4 mg/dL (ref 0.20–1.20)
BUN: 9.6 mg/dL (ref 7.0–26.0)
CALCIUM: 9.7 mg/dL (ref 8.4–10.4)
CO2: 25 mEq/L (ref 22–29)
CREATININE: 0.7 mg/dL (ref 0.6–1.1)
Chloride: 110 mEq/L — ABNORMAL HIGH (ref 98–109)
EGFR: >60 mL/min/{1.73_m2} (ref >60)
Glucose: 94 mg/dl (ref 70–140)
Potassium: 4 mEq/L (ref 3.5–5.1)
SODIUM: 141 meq/L (ref 136–145)
Total Protein: 7.5 g/dL (ref 6.4–8.3)

## 2017-01-24 LAB — CBC WITH DIFFERENTIAL/PLATELET
BASO%: 0.8 % (ref 0.0–2.0)
BASOS ABS: 0.1 10*3/uL (ref 0.0–0.1)
EOS%: 1.7 % (ref 0.0–7.0)
Eosinophils Absolute: 0.1 10*3/uL (ref 0.0–0.5)
HEMATOCRIT: 37.1 % (ref 34.8–46.6)
HGB: 12 g/dL (ref 11.6–15.9)
LYMPH%: 18.3 % (ref 14.0–49.7)
MCH: 27.2 pg (ref 25.1–34.0)
MCHC: 32.5 g/dL (ref 31.5–36.0)
MCV: 83.7 fL (ref 79.5–101.0)
MONO#: 0.5 10*3/uL (ref 0.1–0.9)
MONO%: 7.3 % (ref 0.0–14.0)
NEUT#: 4.8 10*3/uL (ref 1.5–6.5)
NEUT%: 71.9 % (ref 38.4–76.8)
Platelets: 284 10*3/uL (ref 145–400)
RBC: 4.43 10*6/uL (ref 3.70–5.45)
RDW: 15.1 % — ABNORMAL HIGH (ref 11.2–14.5)
WBC: 6.6 10*3/uL (ref 3.9–10.3)
lymph#: 1.2 10*3/uL (ref 0.9–3.3)

## 2017-01-25 LAB — FOLLICLE STIMULATING HORMONE: FSH: 60.4 m[IU]/mL

## 2017-01-28 LAB — ESTRADIOL, ULTRA SENS

## 2017-02-06 NOTE — Progress Notes (Signed)
[place]  [phone] [phone]     ID: Alexandra Beasley DOB: 1965-03-24  MR#: [id number]  GGY#:[id number]  Patient Care Team: Scot Jun, FNP as PCP - General (Family Medicine) Nimrod Wendt, Virgie Dad, MD as Consulting Physician (Oncology) Fanny Skates, MD as Consulting Physician (General Surgery) OTHER MD:  CHIEF COMPLAINT: Atypical lobular hyperplasia  CURRENT TREATMENT: Tamoxifen   HISTORY OF CURRENT ILLNESS: From the original intake note:  "Alexandra Beasley" [NEE-[id number]] had bilateral screening mammography for 12/08/2016 at the [facility]. There was a possible distortion in the left breast, and on 07/21/2016 she'll she underwent left diagnostic mammography with tomography and left breast ultrasonography. The breast density was category B. This confirmed an area of distortion in the lower inner left breast which was not palpable. Ultrasonography found no definite correlate. The axilla was negative.  Biopsy of this area may 07/08/2016 showed ([id number]) a complex sclerosing lesion. Accordingly the patient was referred to surgery and on 08/31/2016 underwent left lumpectomy, which showed atypical lobular hyperplasia ([id number]). She was then referred to the high risk clinic. There has been some delay because of [facility] approval issues  The patient's subsequent history is as detailed below.  INTERVAL HISTORY: Alexandra Beasley returns today for follow-up of her history of atypical ductal hyperplasia. She is accompanied by her son and daughter, who are translating for her. She continues on anastrozole, and she reports that she hasn't been feeling well while taking it. She reports no hot flashes and denies vaginal dryness.  Since her last visit she had an x-ray of the bilateral knees on 12/12/2016 revealing in the left knee: mild arthritic changes and irregularity of the distal femoral surface, minimal sclerosis of the distal femur, mild osteophyte formation at the  patellofemoral joint, and no evidence of malalignment. In the right knee: some osteoarthritic changes, mild joint space narrowing, early osteophyte formation of the distal femur, no ectopic calcification.   REVIEW OF SYSTEMS: Alexandra Beasley reports that she feels tired very often. This morning, she reports feeling dizzy and light headed. She notes minor pain on her left side. She also notes that her tongue is dry. 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 She denies unusual headaches, visual changes, nausea, or vomiting. There has been no unusual cough, phlegm production, or pleurisy. This been no change in bowel or bladder habits. She denies unexplained weight loss, bleeding, rash, or fever. A detailed review of systems was otherwise stable.    PAST MEDICAL HISTORY: Past Medical History:  Diagnosis Date  . Abnormal mammogram of left breast 08/31/2016  . Breast mass, left   . Glaucoma   . Hypertension     PAST SURGICAL HISTORY: Past Surgical History:  Procedure Laterality Date  . ABDOMINAL HYSTERECTOMY    . BREAST LUMPECTOMY WITH RADIOACTIVE SEED LOCALIZATION Left 08/31/2016   Performed by Fanny Skates, MD at [facility]  . CESAREAN SECTION      FAMILY HISTORY Family History  Problem Relation Age of Onset  . Vision loss Mother   . Hypertension Mother   . Heart disease Father   The patient's father is 59 years old and her mother 47 years old as of August 2018. The patient has one brother and one sister. There is no history of breast or ovarian cancer in the family.  GYNECOLOGIC HISTORY:  No LMP recorded. Patient has had a hysterectomy. Menarche age 25, first  live birth age 70, the patient is [place] P2. She is status post remote hysterectomy, without salpingo-oophorectomy  SOCIAL HISTORY:  She is originally from [place]. She is married, her husband has a history of head and neck cancer and is disabled  as a result. He is followed to [facility]. The patient's daughter [name] owns 6 convenience stores. The patient's son [name] is studying pharmacy at [employer]. The patient has one granddaughter. She attends a local [employer]    ADVANCED DIRECTIVES:    HEALTH MAINTENANCE: Social History   Tobacco Use  . Smoking status: Never Smoker  . Smokeless tobacco: Never Used  Substance Use Topics  . Alcohol use: No    Alcohol/week: 0.0 oz  . Drug use: No     Colonoscopy:Never  PAP: Status post hysterectomy  Bone density: Never   No Known Allergies  Current Outpatient Medications  Medication Sig Dispense Refill  . anastrozole (ARIMIDEX) 1 MG tablet Take 1 tablet (1 mg total) by mouth daily. (Patient not taking: Reported on 11/08/2016) 90 tablet 4  . diclofenac sodium (VOLTAREN) 1 % GEL Apply 4 g topically 4 (four) times daily. 100 g 2  . latanoprost (XALATAN) 0.005 % ophthalmic solution Place 1 drop into both eyes daily.    Marland Kitchen lisinopril-hydrochlorothiazide (PRINZIDE,ZESTORETIC) 20-25 MG tablet Take 1 tablet by mouth daily. 90 tablet 1  . meloxicam (MOBIC) 7.5 MG tablet Take 1 tablet (7.5 mg total) by mouth every 12 (twelve) hours as needed for pain. 30 tablet 0  . Multiple Vitamin (MULTIVITAMIN WITH MINERALS) TABS tablet Take 1 tablet by mouth daily.     No current facility-administered medications for this visit.     OBJECTIVE: Middle-aged [place] woman who appears stated age  51:   02/07/17 1047  BP: 136/75  Pulse: 72  Resp: (!) 24  Temp: 98.1 F (36.7 C)  SpO2: 100%     Body mass index is 32.97 kg/m.   Wt Readings from Last 3 Encounters:  02/07/17 186 lb 1.6 oz (84.4 kg)  12/12/16 185 lb (83.9 kg)  11/08/16 188 lb 3.2 oz (85.4 kg)      ECOG FS:1 - Symptomatic but completely ambulatory  Sclerae unicteric, pupils round and equal Oropharynx clear and moist No cervical or supraclavicular adenopathy Lungs no rales or rhonchi Heart regular rate and rhythm Abd soft,  nontender, positive bowel sounds MSK no focal spinal tenderness, no upper extremity lymphedema Neuro: nonfocal, well oriented, appropriate affect Breasts: The right breast is benign.  The left breast is status post lumpectomy.  There is no evidence of local recurrence or skin or nipple changes of concern.  Both axillae are benign  LAB RESULTS:  CMP     Component Value Date/Time   NA 141 01/24/2017 1018   K 4.0 01/24/2017 1018   CL 109 08/25/2016 1130   CO2 25 01/24/2017 1018   GLUCOSE 94 01/24/2017 1018   BUN 9.6 01/24/2017 1018   CREATININE 0.7 01/24/2017 1018   CALCIUM 9.7 01/24/2017 1018   PROT 7.5 01/24/2017 1018   ALBUMIN 3.6 01/24/2017 1018   AST 26 01/24/2017 1018   ALT 26 01/24/2017 1018   ALKPHOS 87 01/24/2017 1018   BILITOT 0.40 01/24/2017 1018   GFRNONAA >60 08/25/2016 1130   GFRNONAA >89 07/04/2016 1115   GFRAA >60 08/25/2016 1130   GFRAA >89 07/04/2016 1115    No results found for: TOTALPROTELP, ALBUMINELP, A1GS, A2GS, BETS, BETA2SER, GAMS, MSPIKE, SPEI  No results found for: KPAFRELGTCHN,  LAMBDASER, [facility]  Lab Results  Component Value Date   WBC 6.6 01/24/2017   NEUTROABS 4.8 01/24/2017   HGB 12.0 01/24/2017   HCT 37.1 01/24/2017   MCV 83.7 01/24/2017   PLT 284 01/24/2017      Chemistry      Component Value Date/Time   NA 141 01/24/2017 1018   K 4.0 01/24/2017 1018   CL 109 08/25/2016 1130   CO2 25 01/24/2017 1018   BUN 9.6 01/24/2017 1018   CREATININE 0.7 01/24/2017 1018      Component Value Date/Time   CALCIUM 9.7 01/24/2017 1018   ALKPHOS 87 01/24/2017 1018   AST 26 01/24/2017 1018   ALT 26 01/24/2017 1018   BILITOT 0.40 01/24/2017 1018       No results found for: LABCA2  No components found for: EPPIRJ188  No results for input(s): INR in the last 168 hours.  No results found for: LABCA2  No results found for: CZY606  No results found for: TKZ601  No results found for: UXN235  No results found for: CA2729  No  components found for: HGQUANT  No results found for: CEA1 / No results found for: CEA1   No results found for: AFPTUMOR  No results found for: CHROMOGRNA  No results found for: PSA1  No visits with results within 3 Day(s) from this visit.  Latest known visit with results is:  Appointment on 01/24/2017  Component Date Value Ref Range Status  . WBC 01/24/2017 6.6  3.9 - 10.3 10e3/uL Final  . NEUT# 01/24/2017 4.8  1.5 - 6.5 10e3/uL Final  . HGB 01/24/2017 12.0  11.6 - 15.9 g/dL Final  . HCT 01/24/2017 37.1  34.8 - 46.6 % Final  . Platelets 01/24/2017 284  145 - 400 10e3/uL Final  . MCV 01/24/2017 83.7  79.5 - 101.0 fL Final  . MCH 01/24/2017 27.2  25.1 - 34.0 pg Final  . MCHC 01/24/2017 32.5  31.5 - 36.0 g/dL Final  . RBC 01/24/2017 4.43  3.70 - 5.45 10e6/uL Final  . RDW 01/24/2017 15.1* 11.2 - 14.5 % Final  . lymph# 01/24/2017 1.2  0.9 - 3.3 10e3/uL Final  . MONO# 01/24/2017 0.5  0.1 - 0.9 10e3/uL Final  . Eosinophils Absolute 01/24/2017 0.1  0.0 - 0.5 10e3/uL Final  . Basophils Absolute 01/24/2017 0.1  0.0 - 0.1 10e3/uL Final  . NEUT% 01/24/2017 71.9  38.4 - 76.8 % Final  . LYMPH% 01/24/2017 18.3  14.0 - 49.7 % Final  . MONO% 01/24/2017 7.3  0.0 - 14.0 % Final  . EOS% 01/24/2017 1.7  0.0 - 7.0 % Final  . BASO% 01/24/2017 0.8  0.0 - 2.0 % Final  . Estradiol, Sensitive 01/24/2017 <2.5  pg/mL Final   Comment:                           Female:                            Follicular:                    30.0 - 100.0                            Luteal:  70.0 - 300.0                            Postmenopausal:                      < 15.0 **Verified by repeat analysis** This test was developed and its performance characteristics determined by [facility]. It has not been cleared by the Food and Drug Administration. Methodology: Liquid chromatography tandem mass spectrometry(LC/MS/MS)   . [facility] 01/24/2017 60.4  mIU/mL Final   Comment:                                     Adult Female:                                      Follicular phase      3.5 -  12.5                                      Ovulation phase       4.7 -  21.5                                      Luteal phase          1.7 -   7.7                                      Postmenopausal       25.8 - 134.8   . Sodium 01/24/2017 141  136 - 145 mEq/L Final  . Potassium 01/24/2017 4.0  3.5 - 5.1 mEq/L Final  . Chloride 01/24/2017 110* 98 - 109 mEq/L Final  . CO2 01/24/2017 25  22 - 29 mEq/L Final  . Glucose 01/24/2017 94  70 - 140 mg/dl Final   Glucose reference range is for nonfasting patients. Fasting glucose reference range is 70- 100.  Marland Kitchen BUN 01/24/2017 9.6  7.0 - 26.0 mg/dL Final  . Creatinine 01/24/2017 0.7  0.6 - 1.1 mg/dL Final  . Total Bilirubin 01/24/2017 0.40  0.20 - 1.20 mg/dL Final  . Alkaline Phosphatase 01/24/2017 87  40 - 150 U/L Final  . AST 01/24/2017 26  5 - 34 U/L Final  . ALT 01/24/2017 26  0 - 55 U/L Final  . Total Protein 01/24/2017 7.5  6.4 - 8.3 g/dL Final  . Albumin 01/24/2017 3.6  3.5 - 5.0 g/dL Final  . Calcium 01/24/2017 9.7  8.4 - 10.4 mg/dL Final  . Anion Gap 01/24/2017 7  3 - 11 mEq/L Final  . EGFR 01/24/2017 >60  >60 ml/min/1.73 m2 Final   eGFR is calculated using the CKD-EPI Creatinine Equation (2009)    (this displays the last labs from the last 3 days)  No results found for: TOTALPROTELP, ALBUMINELP, A1GS, A2GS, BETS, BETA2SER, GAMS, MSPIKE, SPEI (this displays SPEP labs)  No results found for: KPAFRELGTCHN, LAMBDASER, KAPLAMBRATIO (kappa/lambda light chains)  No results found for: HGBA, HGBA2QUANT, HGBFQUANT, HGBSQUAN (Hemoglobinopathy evaluation)   No results found for: LDH  No results found for: IRON, TIBC, IRONPCTSAT (Iron and TIBC)  No results found for: FERRITIN  Urinalysis    Component Value Date/Time   LABSPEC 1.025 07/04/2016 1118   PHURINE 6.0 07/04/2016 1118   GLUCOSEU NEGATIVE 07/04/2016 1118   HGBUR NEGATIVE 07/04/2016 1118    [place] 07/04/2016 1118   [place] 07/04/2016 1118   PROTEINUR NEGATIVE 07/04/2016 1118   UROBILINOGEN 0.2 07/04/2016 1118   NITRITE NEGATIVE 07/04/2016 1118   LEUKOCYTESUR NEGATIVE 07/04/2016 1118     STUDIES: We reviewed the [place] and estradiol levels which confirm menopause  ASSESSMENT: 51 y.o. [place] woman status post left breast lumpectomy 08/31/2016 for atypical lobular hyperplasia  (1) anastrozole started 11/07/2016, discontinued February 07, 2017 with poor tolerance  (2) to start tamoxifen 03/22/27  (a) status post remote hysterectomy, without salpingo-oophorectomy  PLAN: Alexandra Beasley did not do well with anastrozole.  She was very motivated to take it and did continue it, but it is causing her arthralgias and myalgias and this is interfering with her sleep and work.  We are going off that medication  I reassured her that within 2 weeks she will be feeling considerably better.  We then discussed the option of observation alone versus tamoxifen.  She is very interested in tamoxifen.  We discussed the possible toxicities side effects and complications.  It is favorable that she is status post hysterectomy.  We specifically discussed the symptoms of blood clots and if she develops asymmetric swelling of an ankle for example or sudden shortness of breath she will call  She will start tamoxifen March 21, 2016.  I am going to see her again in March.  If she tolerates it well the plan will be to continue for a total of 5 years  Otherwise we will observe of treatment  She knows to call for any other reasons that may develop before the next visit.  Melvinia Ashby, Virgie Dad, MD  02/07/17 11:06 AM Medical Oncology and Hematology [facility] [address] [place], [place] [address] Tel. [phone]    Fax. [phone]  This document serves as a record of services personally performed by Lurline Del, MD. It was created on his behalf by Sheron Nightingale, a trained medical scribe. The creation of this record is based on the scribe's personal observations and the provider's statements to them.   I have reviewed the above documentation for accuracy and completeness, and I agree with the above.

## 2017-02-07 ENCOUNTER — Telehealth: Payer: Self-pay | Admitting: Oncology

## 2017-02-07 ENCOUNTER — Ambulatory Visit (HOSPITAL_BASED_OUTPATIENT_CLINIC_OR_DEPARTMENT_OTHER): Payer: Medicaid Other | Admitting: Oncology

## 2017-02-07 VITALS — BP 136/75 | HR 72 | Temp 98.1°F | Resp 24 | Ht 63.0 in | Wt 186.1 lb

## 2017-02-07 DIAGNOSIS — N6092 Unspecified benign mammary dysplasia of left breast: Secondary | ICD-10-CM

## 2017-02-07 DIAGNOSIS — M2550 Pain in unspecified joint: Secondary | ICD-10-CM | POA: Diagnosis not present

## 2017-02-07 DIAGNOSIS — M791 Myalgia, unspecified site: Secondary | ICD-10-CM | POA: Diagnosis not present

## 2017-02-07 DIAGNOSIS — R42 Dizziness and giddiness: Secondary | ICD-10-CM | POA: Diagnosis not present

## 2017-02-07 NOTE — Telephone Encounter (Signed)
Gave patient avs and calendar with appts per 11/20 los.

## 2017-02-22 ENCOUNTER — Encounter (HOSPITAL_COMMUNITY): Payer: Self-pay | Admitting: *Deleted

## 2017-03-22 ENCOUNTER — Other Ambulatory Visit: Payer: Self-pay

## 2017-03-22 MED ORDER — TAMOXIFEN CITRATE 20 MG PO TABS: 20.0000 mg | ORAL_TABLET | Freq: Every day | ORAL | 0 refills | Status: DC

## 2017-03-22 NOTE — Progress Notes (Signed)
Pt called to report that she had called her pharmacy to see if her tamoxifen was ready for pick up but found that it was not called in yet. Per Dr.Magrinat last visit note, pt to start tamoxifen beginning of January.   Will send script to her preferred pharmacy. Told pt to call with any concerns or questions. Will send  For 1 month supply and she can call to let us know how she is tolerating medication prior to future refills. Pt verbalized understanding.

## 2017-04-20 ENCOUNTER — Other Ambulatory Visit: Payer: Self-pay | Admitting: *Deleted

## 2017-04-20 MED ORDER — TAMOXIFEN CITRATE 20 MG PO TABS: 20.0000 mg | ORAL_TABLET | Freq: Every day | ORAL | 0 refills | Status: DC

## 2017-05-12 ENCOUNTER — Ambulatory Visit: Payer: Medicaid Other | Admitting: Family Medicine

## 2017-05-12 ENCOUNTER — Encounter: Payer: Self-pay | Admitting: Family Medicine

## 2017-05-12 VITALS — BP 120/68 | HR 80 | Temp 97.5°F | Resp 14 | Ht 63.0 in | Wt 187.0 lb

## 2017-05-12 DIAGNOSIS — I10 Essential (primary) hypertension: Secondary | ICD-10-CM

## 2017-05-12 DIAGNOSIS — R0602 Shortness of breath: Secondary | ICD-10-CM

## 2017-05-12 DIAGNOSIS — M170 Bilateral primary osteoarthritis of knee: Secondary | ICD-10-CM

## 2017-05-12 DIAGNOSIS — R9431 Abnormal electrocardiogram [ECG] [EKG]: Secondary | ICD-10-CM

## 2017-05-12 DIAGNOSIS — R42 Dizziness and giddiness: Secondary | ICD-10-CM

## 2017-05-12 DIAGNOSIS — R7303 Prediabetes: Secondary | ICD-10-CM | POA: Diagnosis not present

## 2017-05-12 LAB — POCT GLYCOSYLATED HEMOGLOBIN (HGB A1C): Hemoglobin A1C: 5.5

## 2017-05-12 LAB — POCT URINALYSIS DIP (DEVICE)
Bilirubin Urine: NEGATIVE
GLUCOSE, UA: NEGATIVE mg/dL
Hgb urine dipstick: NEGATIVE
Ketones, ur: NEGATIVE mg/dL
Leukocytes, UA: NEGATIVE
Nitrite: NEGATIVE
PH: 5.5 (ref 5.0–8.0)
PROTEIN: NEGATIVE mg/dL
UROBILINOGEN UA: 0.2 mg/dL (ref 0.0–1.0)

## 2017-05-12 MED ORDER — OLMESARTAN MEDOXOMIL 5 MG PO TABS: 10.0000 mg | ORAL_TABLET | Freq: Every day | ORAL | 1 refills | Status: DC

## 2017-05-12 MED ORDER — OMEPRAZOLE 40 MG PO CPDR: 40.0000 mg | DELAYED_RELEASE_CAPSULE | Freq: Every day | ORAL | 3 refills | Status: DC

## 2017-05-12 NOTE — Progress Notes (Deleted)
Patient ID: Alexandra Beasley, female    DOB: 1965-10-01, 52 y.o.   MRN: [id number]  PCP: Scot Jun, FNP  Chief Complaint  Patient presents with  . Follow-up    6 month on chronic condition  . Knee Pain    Subjective:  HPI  Alexandra Beasley is a 52 y.o. female presents for evaluation of chronic pain.  R&L knee intermittently aching pain 8/10, exacerbated with long periods of standing and sitting    Social History   Socioeconomic History  . Marital status: Married    Spouse name: Not on file  . Number of children: Not on file  . Years of education: Not on file  . Highest education level: Not on file  Social Needs  . Financial resource strain: Not on file  . Food insecurity - worry: Not on file  . Food insecurity - inability: Not on file  . Transportation needs - medical: Not on file  . Transportation needs - non-medical: Not on file  Occupational History  . Not on file  Tobacco Use  . Smoking status: Never Smoker  . Smokeless tobacco: Never Used  Substance and Sexual Activity  . Alcohol use: No    Alcohol/week: 0.0 oz  . Drug use: No  . Sexual activity: No    Partners: Male  Other Topics Concern  . Not on file  Social History Narrative  . Not on file    Family History  Problem Relation Age of Onset  . Vision loss Mother   . Hypertension Mother   . Heart disease Father      Review of Systems   Dizziness occasionally with house hold activities  Patient Active Problem List   Diagnosis Date Noted  . 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 Abnormal mammogram of left breast 08/31/2016  . Glaucoma 06/04/2015  . Pain in joint, shoulder region 06/04/2015    No Known Allergies  Prior to Admission medications   Medication Sig Start Date End Date Taking? Authorizing Provider  lisinopril-hydrochlorothiazide (PRINZIDE,ZESTORETIC) 20-25 MG tablet Take 1 tablet by mouth daily. 12/28/15  Yes Micheline Chapman, NP  Multiple Vitamin  (MULTIVITAMIN WITH MINERALS) TABS tablet Take 1 tablet by mouth daily.   Yes [provider]  tamoxifen (NOLVADEX) 20 MG tablet Take 1 tablet (20 mg total) by mouth daily. 04/20/17  Yes Magrinat, Virgie Dad, MD  diclofenac sodium (VOLTAREN) 1 % GEL Apply 4 g topically 4 (four) times daily. Patient not taking: Reported on 05/12/2017 10/03/16   Scot Jun, FNP  latanoprost (XALATAN) 0.005 % ophthalmic solution Place 1 drop into both eyes daily.    [provider]  meloxicam (MOBIC) 7.5 MG tablet Take 1 tablet (7.5 mg total) by mouth every 12 (twelve) hours as needed for pain. Patient not taking: Reported on 05/12/2017 11/08/16   Scot Jun, FNP    Past Medical, Surgical Family and Social History reviewed and updated.    Objective:   Today's Vitals   05/12/17 0937  BP: 120/68  Pulse: 80  Resp: 14  Temp: (!) 97.5 F (36.4 C)  TempSrc: Oral  SpO2: 96%  Weight: 187 lb (84.8 kg)  Height: 5\' 3"  (1.6 m)    Wt Readings from Last 3 Encounters:  05/12/17 187 lb (84.8 kg)  02/07/17 186 lb 1.6 oz (84.4 kg)  12/12/16 185 lb (83.9 kg)    Physical Exam  Crepitus in bialteral knees        Assessment &  Plan:  1. Encounter for screening for cardiovascular disorders ***  2. Screening for gout ***   If symptoms worsen or do not improve, return for follow-up, follow-up with PCP, or at the emergency department if severity of symptoms warrant a higher level of care.

## 2017-05-12 NOTE — Patient Instructions (Addendum)
Please discontinue use of the amlodipine immediately. Please discard the lisinopril and do not continue to take. Please take the newly prescribed olmesartan as ordered. May take tylenol every 6 hours as needed for joint pain.   Arthritis Arthritis means joint pain. It can also mean joint disease. A joint is a place where bones come together. People who have arthritis may have:  Red joints.  Swollen joints.  Stiff joints.  Warm joints.  A fever.  A feeling of being sick.  Follow these instructions at home: Pay attention to any changes in your symptoms. Take these actions to help with your pain and swelling. Medicines  Take over-the-counter and prescription medicines only as told by your doctor.  Do not take aspirin for pain if your doctor says that you may have gout. Activity  Rest your joint if your doctor tells you to.  Avoid activities that make the pain worse.  Exercise your joint regularly as told by your doctor. Try doing exercises like: ? Swimming. ? Water aerobics. ? Biking. ? Walking. Joint Care   If your joint is swollen, keep it raised (elevated) if told by your doctor.  If your joint feels stiff in the morning, try taking a warm shower.  If you have diabetes, do not apply heat without asking your doctor.  If told, apply heat to the joint: ? Put a towel between the joint and the hot pack or heating pad. ? Leave the heat on the area for 20-30 minutes.  If told, apply ice to the joint: ? Put ice in a plastic bag. ? Place a towel between your skin and the bag. ? Leave the ice on for 20 minutes, 2-3 times per day.  Keep all follow-up visits as told by your doctor. Contact a doctor if:  The pain gets worse.  You have a fever. Get help right away if:  You have very bad pain in your joint.  You have swelling in your joint.  Your joint is red.  Many joints become painful and swollen.  You have very bad back pain.  Your leg is very weak.  You  cannot control your pee (urine) or poop (stool). This information is not intended to replace advice given to you by your health care provider. Make sure you discuss any questions you have with your health care provider. Document Released: 06/01/2009 Document Revised: 08/13/2015 Document Reviewed: 06/02/2014 Elsevier Interactive Patient Education  [employer].

## 2017-05-12 NOTE — Progress Notes (Signed)
Patient ID: Alexandra Beasley, female    DOB: 07-26-1965, 52 y.o.   MRN: [id number]  PCP: Alexandra Jun, FNP  Chief Complaint  Patient presents with  . Follow-up    6 month on chronic condition  . Knee Pain    Subjective:  HPI-Alexandra Beasley is a 52 y.o. 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 Since her last visit she has discontinued taking prescribed hypertensive therapy lisinopril-hydrochlorothiazide and started amlodipine and olmesartan which was acquired from [place].  She also complains of occasional dizziness worsened with activity. She also complains of new onset shortness of breath which is worsened when lying on left side and ambulating up stairs. She denies any chest tightness or chest pain with this activity. No prior cardiac history. She is sedentary. Current Body mass index is 33.13 kg/m.  Social History   Socioeconomic History  . Marital status: Married    Spouse name: Not on file  . Number of children: Not on file  . Years of education: Not on file  . Highest education level: Not on file  Social Needs  . Financial resource strain: Not on file  . Food insecurity - worry: Not on file  . Food insecurity - inability: Not on file  . Transportation needs - medical: Not on file  . Transportation needs - non-medical: Not on file  Occupational History  . Not on file  Tobacco Use  . Smoking status: Never Smoker  . Smokeless tobacco: Never Used  Substance and Sexual Activity  . Alcohol use: No    Alcohol/week: 0.0 oz  . Drug use: No  . Sexual activity: No    Partners: Male  Other Topics Concern  . Not on file  Social History Narrative  . Not on file    Family History  Problem Relation Age of Onset  . Vision loss Mother   . Hypertension Mother   . Heart disease Father     Review of  Systems  HENT: Negative.   Eyes: Negative.   Respiratory: Positive for shortness of breath.   Cardiovascular: Negative.   Musculoskeletal: Positive for arthralgias.    Patient Active Problem List   Diagnosis Date Noted  . 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 Abnormal mammogram of left breast 08/31/2016  . Glaucoma 06/04/2015  . Pain in joint, shoulder region 06/04/2015    No Known Allergies  Prior to Admission medications   Medication Sig Start Date End Date Taking? Authorizing Provider  lisinopril-hydrochlorothiazide (PRINZIDE,ZESTORETIC) 20-25 MG tablet Take 1 tablet by mouth daily. 12/28/15  Yes Micheline Chapman, NP  Multiple Vitamin (MULTIVITAMIN WITH MINERALS) TABS tablet Take 1 tablet by mouth daily.   Yes [provider]  tamoxifen (NOLVADEX) 20 MG tablet Take 1 tablet (20 mg total) by mouth daily. 04/20/17  Yes Magrinat, Virgie Dad, MD  diclofenac sodium (VOLTAREN) 1 % GEL Apply 4 g topically 4 (four) times daily. Patient not taking: Reported on 05/12/2017 10/03/16   Alexandra Jun, FNP  latanoprost (XALATAN) 0.005 % ophthalmic solution Place 1 drop into both eyes daily.    [provider]  meloxicam (MOBIC) 7.5 MG tablet Take 1 tablet (7.5 mg total) by mouth every 12 (twelve) hours as needed for pain. Patient not taking: Reported on 05/12/2017 11/08/16   Alexandra Jun, FNP    Past Medical,  Surgical Family and Social History reviewed and updated.    Objective:   Today's Vitals   05/12/17 0937  BP: 120/68  Pulse: 80  Resp: 14  Temp: (!) 97.5 F (36.4 C)  TempSrc: Oral  SpO2: 96%  Weight: 187 lb (84.8 kg)  Height: 5\' 3"  (1.6 m)    Wt Readings from Last 3 Encounters:  05/12/17 187 lb (84.8 kg)  02/07/17 186 lb 1.6 oz (84.4 kg)  12/12/16 185 lb (83.9 kg)   Physical Exam  Constitutional: She is oriented to person, place, and time. She appears well-developed and well-nourished.  HENT:  Head: Normocephalic and  atraumatic.  Eyes: Conjunctivae and EOM are normal. Pupils are equal, round, and reactive to light.  Neck: Normal range of motion. Neck supple. No thyromegaly present.  Cardiovascular: Normal rate, regular rhythm, normal heart sounds and intact distal pulses.  Pulmonary/Chest: Effort normal and breath sounds normal.  Abdominal: Soft.  Musculoskeletal: Normal range of motion.       Right knee: Tenderness found.       Left knee: Tenderness found.  Lymphadenopathy:    She has cervical adenopathy.  Neurological: She is alert and oriented to person, place, and time.  Skin: Skin is warm and dry.  Psychiatric: She has a normal mood and affect. Her behavior is normal. Judgment and thought content normal.    Assessment & Plan:  1. Primary osteoarthritis of both knees, continue diclofenac gel to the knees bilaterally up to 4 times daily and heat applications.  If symptoms worsen or do not improve follow-up with [facility] for further evaluation as they requested that you follow-up if symptoms worsen.  2. SOB (shortness of breath), new, patient will be further worked up by cardiology to rule out CAD.  She is absent of chest pain although has shortness of breath at rest and with exertion.  EKG 12-lead- NSR, 70 BPM, and T wave abnormality  3. Prediabetes, improved - POCT glycosylated hemoglobin (Hb A1C)-5.5 resolved. A1C today 5.5. Repeat 12 months.  4. Abnormal EKG, EKG 12-lead- NSR, 70 BPM, and T wave abnormality.  Patient will be referred to cardiology for further workup and evaluation as she is complaining of shortness of breath while at rest worst while lying on her left side and with walking upstairs.  This is a new problem that warrants further cardiology workup.  Risk factors for heart disease include hypertension, overweight, prior history of prediabetes.   5. Essential hypertension, stable however patient independently changed antihypertension medication regimen without consultation  here at the office.  She is currently taken amlodipine and olmesartan however complains of some acute onset dizziness.  Although she suffers from hypertension blood pressure has ranged within 130/80 to 140/80. We have discussed target BP range and blood pressure goal. I have advised patient to check BP regularly and to call us back or report to clinic if the numbers are consistently higher than 140/90. We discussed the importance of compliance with medical therapy and DASH diet recommended, consequences of uncontrolled hypertension discussed.    6. Dizziness, suspect medication induced.  Patient is taking amlodipine 5 mg along with olmesartan 20 mg which was not previously prescribed.  She required medication from [place] and independently dose without consultation of the office.  I am stopping her amlodipine today discontinue lisinopril and starting her on olmesartan 10 mg as she felt that lisinopril was causing indigestion.   Meds ordered this encounter  Medications  . olmesartan (BENICAR) 5 MG tablet  Sig: Take 2 tablets (10 mg total) by mouth daily.    Dispense:  60 tablet    Refill:  1    Order Specific Question:   Supervising Provider    Answer:   Tresa Garter [id number]  . omeprazole (PRILOSEC) 40 MG capsule    Sig: Take 1 capsule (40 mg total) by mouth daily.    Dispense:  30 capsule    Refill:  3    Order Specific Question:   Supervising Provider    Answer:   Tresa Garter [id number]    Orders Placed This Encounter  Procedures  . Ambulatory referral to Cardiology    Referral Priority:   Routine    Referral Type:   Consultation    Referral Reason:   Specialty Services Required    Requested Specialty:   Cardiology    Number of Visits Requested:   1  . POCT urinalysis dip (device)  . POCT glycosylated hemoglobin (Hb A1C)  . EKG 12-Lead     RTC: April for labs and BP check. 6 months for chronic conditions.   A total of 40 minutes spent, greater than 50 % of  this time was spent reviewing prior medical history, reviewing medications and indications of treatment, prior labs and diagnostic tests, discussing current plan of treatment, health promotion, and goals of treatment.   Carroll Sage. Kenton Kingfisher, MSN, FNP-C [facility]  [address] [place], [place] [address] [phone]

## 2017-05-16 NOTE — Progress Notes (Signed)
Referring-Kimberly Kenton Kingfisher, [place] Reason for referral- Dyspnea and abnormal ECG  HPI: 52 yo female for evaluation of dyspnea and abnormal ECG at request of Molli Barrows, FNP.  Patient describes dyspnea with more vigorous activities over the past year.  There is some dyspnea with lying on her left side.  No orthopnea, PND, pedal edema, chest pain, palpitations or syncope.  Because of the above we were asked to evaluate.  Current Outpatient Medications  Medication Sig Dispense Refill  . amLODipine (NORVASC) 5 MG tablet Take 5 mg by mouth daily.    Marland Kitchen latanoprost (XALATAN) 0.005 % ophthalmic solution Place 1 drop into both eyes daily.    Marland Kitchen olmesartan (BENICAR) 20 MG tablet Take 20 mg by mouth daily.    Marland Kitchen omeprazole (PRILOSEC) 40 MG capsule Take 1 capsule (40 mg total) by mouth daily. 30 capsule 3  . tamoxifen (NOLVADEX) 20 MG tablet Take 1 tablet (20 mg total) by mouth daily. 30 tablet 0   No current facility-administered medications for this visit.     No Known Allergies   Past Medical History:  Diagnosis Date  . Abnormal mammogram of left breast 08/31/2016  . Breast mass, left   . Glaucoma   . Hypertension     Past Surgical History:  Procedure Laterality Date  . ABDOMINAL HYSTERECTOMY    . BREAST LUMPECTOMY WITH RADIOACTIVE SEED LOCALIZATION Left 08/31/2016   Procedure: BREAST LUMPECTOMY WITH RADIOACTIVE SEED LOCALIZATION;  Surgeon: Fanny Skates, MD;  Location: [facility];  Service: General;  Laterality: Left;  . CESAREAN SECTION      Social History   Socioeconomic History  . Marital status: Married    Spouse name: Not on file  . Number of children: 2  . Years of education: Not on file  . Highest education level: Not on file  Social Needs  . Financial resource strain: Not on file  . Food insecurity - worry: Not on file  . Food insecurity - inability: Not on file  . Transportation needs - medical: Not on file  . Transportation needs - non-medical:  Not on file  Occupational History  . Not on file  Tobacco Use  . Smoking status: Never Smoker  . Smokeless tobacco: Never Used  Substance and Sexual Activity  . Alcohol use: No    Alcohol/week: 0.0 oz  . Drug use: No  . Sexual activity: No    Partners: Male  Other Topics Concern  . Not on file  Social History Narrative  . Not on file    Family History  Problem Relation Age of Onset  . Vision loss Mother   . Hypertension Mother   . Heart disease Father     ROS: no fevers or chills, productive cough, hemoptysis, dysphasia, odynophagia, melena, hematochezia, dysuria, hematuria, rash, seizure activity, orthopnea, PND, pedal edema, claudication. Remaining systems are negative.  Physical Exam:   Blood pressure 117/81, pulse 76, height 5\' 3"  (1.6 m), weight 186 lb 6.4 oz (84.6 kg).  General:  Well developed/obese in NAD Skin warm/dry Patient not depressed No peripheral clubbing Back-normal HEENT-normal/normal eyelids Neck supple/normal carotid upstroke bilaterally; no bruits; no JVD; no thyromegaly chest - CTA/ normal expansion CV - RRR/normal S1 and S2; no murmurs, rubs or gallops;  PMI nondisplaced Abdomen -NT/ND, no HSM, no mass, + bowel sounds, no bruit 2+ femoral pulses, no bruits Ext-no edema, chords, 2+ DP Neuro-grossly nonfocal  ECG - 05/12/17-NSR, inferolateral TWI; no change compared to 08/25/16; personally reviewed  A/P  1 dyspnea-etiology unclear.  Her electrocardiogram is abnormal but is not changed compared to June 2018.  I will arrange an echocardiogram to assess LV function and a stress echocardiogram to screen for coronary disease.  She will need imaging as her baseline ECG is abnormal.  There may also be a component of deconditioning and obesity and we discussed the importance of exercise.  She also snores.  If her cardiac workup is negative I have asked her to follow-up with primary care and she would likely need a sleep study to rule out sleep apnea and if  present CPAP.  She does not have risk factors for pulmonary embolus.  2 hypertension-blood pressure is controlled.  Continue present medications.  3 gastroesophageal reflux disease-management per primary care.  Kirk Ruths, MD

## 2017-05-17 ENCOUNTER — Ambulatory Visit (INDEPENDENT_AMBULATORY_CARE_PROVIDER_SITE_OTHER): Payer: Medicaid Other | Admitting: Cardiology

## 2017-05-17 ENCOUNTER — Encounter: Payer: Self-pay | Admitting: Cardiology

## 2017-05-17 VITALS — BP 117/81 | HR 76 | Ht 63.0 in | Wt 186.4 lb

## 2017-05-17 DIAGNOSIS — R0602 Shortness of breath: Secondary | ICD-10-CM | POA: Diagnosis not present

## 2017-05-17 DIAGNOSIS — I10 Essential (primary) hypertension: Secondary | ICD-10-CM | POA: Diagnosis not present

## 2017-05-17 DIAGNOSIS — R9431 Abnormal electrocardiogram [ECG] [EKG]: Secondary | ICD-10-CM | POA: Diagnosis not present

## 2017-05-17 NOTE — Patient Instructions (Signed)
Medication Instructions:   NO CHANGE  Testing/Procedures:  Your physician has requested that you have an echocardiogram. Echocardiography is a painless test that uses sound waves to create images of your heart. It provides your doctor with information about the size and shape of your heart and how well your heart's chambers and valves are working. This procedure takes approximately one hour. There are no restrictions for this procedure.   Your physician has requested that you have a stress echocardiogram. For further information please visit [email or web address]. Please follow instruction sheet as given.    Follow-Up:  Your physician recommends that you schedule a follow-up appointment in: AS NEEDED PENDING TEST RESULTS    Exercise Stress Echocardiogram An exercise stress echocardiogram is a test that checks how well your heart is working. For this test, you will walk on a treadmill to make your heart beat faster. This test uses sound waves (ultrasound) and a computer to make pictures (images) of your heart. These pictures will be taken before you exercise and after you exercise. What happens before the procedure?  Follow instructions from your doctor about what you cannot eat or drink before the test.  Do not drink or eat anything that has caffeine in it. Stop having caffeine for 24 hours before the test.  Ask your doctor about changing or stopping your normal medicines. This is important if you take diabetes medicines or blood thinners. Ask your doctor if you should take your medicines with water before the test.  If you use an inhaler, bring it to the test.  Do not use any products that have nicotine or tobacco in them, such as cigarettes and e-cigarettes. Stop using them for 4 hours before the test. If you need help quitting, ask your doctor.  Wear comfortable shoes and clothing. What happens during the procedure?  You will be hooked up to a TV screen. Your doctor will watch the  screen to see how fast your heart beats during the test.  Before you exercise, a computer will make a picture of your heart. To do this: ? A gel will be put on your chest. ? A wand will be moved over the gel. ? Sound waves from the wand will go to the computer to make the picture.  Your will start walking on a treadmill. The treadmill will start at a slow speed. It will get faster a little bit at a time. When you walk faster, your heart will beat faster.  The treadmill will be stopped when your heart is working hard.  You will lie down right away so another picture of your heart can be taken.  The test will take 30-60 minutes. What happens after the procedure?  Your heart rate and blood pressure will be watched after the test.  If your doctor says that you can, you may: ? Eat what you usually eat. ? Do your normal activities. ? Take medicines like normal. Summary  An exercise stress echocardiogram is a test that checks how well your heart is working.  Follow instructions about what you cannot eat or drink before the test. Ask your doctor if you should take your normal medicines before the test.  Stop having caffeine for 24 hours before the test. Do not use anything with nicotine or tobacco in it for 4 hours before the test.  A computer will take a picture of your heart before you walk on a treadmill. It will take another picture when you are done walking.  Your  heart rate and blood pressure will be watched after the test. This information is not intended to replace advice given to you by your health care provider. Make sure you discuss any questions you have with your health care provider. Document Released: 01/02/2009 Document Revised: 11/29/2015 Document Reviewed: 11/29/2015 Elsevier Interactive Patient Education  2017 [employer].

## 2017-05-18 ENCOUNTER — Other Ambulatory Visit: Payer: Self-pay

## 2017-05-18 ENCOUNTER — Telehealth: Payer: Self-pay

## 2017-05-18 NOTE — Telephone Encounter (Signed)
Authorization was denied for Benicar. Patient has to try and fail Losartan and  Valsartan before approval.

## 2017-05-18 NOTE — Telephone Encounter (Signed)
PA has been submitted and is waiting for approval.

## 2017-05-19 ENCOUNTER — Other Ambulatory Visit: Payer: Self-pay | Admitting: Family Medicine

## 2017-05-19 MED ORDER — VALSARTAN 40 MG PO TABS
40.0000 mg | ORAL_TABLET | Freq: Every day | ORAL | 1 refills | Status: DC
Start: 2017-05-19 — End: 2018-01-01

## 2017-05-19 NOTE — Telephone Encounter (Signed)
Patient notified and will pick up new medication.  

## 2017-05-19 NOTE — Telephone Encounter (Signed)
Due to insurance requirements DC and Benicar.  Will start patient on valsartan 40 mg once daily.  Contact patient to advise of this change and reason for the change.

## 2017-05-24 ENCOUNTER — Other Ambulatory Visit: Payer: Self-pay | Admitting: *Deleted

## 2017-05-24 MED ORDER — TAMOXIFEN CITRATE 20 MG PO TABS: 20.0000 mg | ORAL_TABLET | Freq: Every day | ORAL | 0 refills | Status: DC

## 2017-06-01 ENCOUNTER — Telehealth (HOSPITAL_COMMUNITY): Payer: Self-pay | Admitting: *Deleted

## 2017-06-01 NOTE — Telephone Encounter (Signed)
Patient's daughter given instructions for upcoming stress echo on 06/05/17. She expressed understanding.  Kirstie Peri

## 2017-06-02 ENCOUNTER — Telehealth (HOSPITAL_COMMUNITY): Payer: Self-pay | Admitting: Cardiology

## 2017-06-05 ENCOUNTER — Other Ambulatory Visit (HOSPITAL_COMMUNITY): Payer: Self-pay

## 2017-06-06 NOTE — Progress Notes (Signed)
No show

## 2017-06-06 NOTE — Telephone Encounter (Signed)
User: Cherie Dark A Date/time: 06/02/17 3:32 PM  Comment: Called pt per interpretor and attempted to lmsg but there was no VM set up.   Context:  Outcome: No Answer/Busy  Phone number: [phone] Phone Type: Home Phone  Comm. type: Telephone Call type: Outgoing  Contact: Karma Greaser Relation to patient: Self

## 2017-06-07 ENCOUNTER — Encounter: Payer: Self-pay | Admitting: Oncology

## 2017-06-07 ENCOUNTER — Other Ambulatory Visit: Payer: Self-pay

## 2017-06-07 ENCOUNTER — Ambulatory Visit (HOSPITAL_BASED_OUTPATIENT_CLINIC_OR_DEPARTMENT_OTHER): Payer: Self-pay | Admitting: Oncology

## 2017-06-07 DIAGNOSIS — N6092 Unspecified benign mammary dysplasia of left breast: Secondary | ICD-10-CM

## 2017-06-14 ENCOUNTER — Other Ambulatory Visit (HOSPITAL_COMMUNITY): Payer: Self-pay

## 2017-06-15 ENCOUNTER — Encounter: Payer: Self-pay | Admitting: Adult Health

## 2017-06-15 ENCOUNTER — Telehealth (HOSPITAL_COMMUNITY): Payer: Self-pay | Admitting: *Deleted

## 2017-06-15 ENCOUNTER — Inpatient Hospital Stay (HOSPITAL_BASED_OUTPATIENT_CLINIC_OR_DEPARTMENT_OTHER): Payer: Medicaid Other | Admitting: Adult Health

## 2017-06-15 ENCOUNTER — Telehealth: Payer: Self-pay | Admitting: Oncology

## 2017-06-15 ENCOUNTER — Inpatient Hospital Stay: Payer: Medicaid Other | Attending: Oncology

## 2017-06-15 ENCOUNTER — Other Ambulatory Visit: Payer: Self-pay

## 2017-06-15 VITALS — BP 127/79 | HR 57 | Temp 98.2°F | Resp 19 | Ht 63.0 in | Wt 186.4 lb

## 2017-06-15 DIAGNOSIS — N6092 Unspecified benign mammary dysplasia of left breast: Secondary | ICD-10-CM | POA: Diagnosis present

## 2017-06-15 DIAGNOSIS — Z1239 Encounter for other screening for malignant neoplasm of breast: Secondary | ICD-10-CM

## 2017-06-15 DIAGNOSIS — Z90710 Acquired absence of both cervix and uterus: Secondary | ICD-10-CM

## 2017-06-15 DIAGNOSIS — M129 Arthropathy, unspecified: Secondary | ICD-10-CM

## 2017-06-15 DIAGNOSIS — Z79810 Long term (current) use of selective estrogen receptor modulators (SERMs): Secondary | ICD-10-CM

## 2017-06-15 DIAGNOSIS — I10 Essential (primary) hypertension: Secondary | ICD-10-CM | POA: Diagnosis not present

## 2017-06-15 DIAGNOSIS — E2839 Other primary ovarian failure: Secondary | ICD-10-CM

## 2017-06-15 DIAGNOSIS — Z79899 Other long term (current) drug therapy: Secondary | ICD-10-CM | POA: Diagnosis not present

## 2017-06-15 LAB — CBC WITH DIFFERENTIAL/PLATELET
BASOS PCT: 1 %
Basophils Absolute: 0.1 10*3/uL (ref 0.0–0.1)
EOS ABS: 0.1 10*3/uL (ref 0.0–0.5)
Eosinophils Relative: 2 %
HCT: 37.1 % (ref 34.8–46.6)
Hemoglobin: 12.2 g/dL (ref 11.6–15.9)
Lymphocytes Relative: 21 %
Lymphs Abs: 1.2 10*3/uL (ref 0.9–3.3)
MCH: 28.2 pg (ref 25.1–34.0)
MCHC: 33 g/dL (ref 31.5–36.0)
MCV: 85.7 fL (ref 79.5–101.0)
Monocytes Absolute: 0.4 10*3/uL (ref 0.1–0.9)
Monocytes Relative: 6 %
NEUTROS PCT: 70 %
Neutro Abs: 4.1 10*3/uL (ref 1.5–6.5)
Platelets: 229 10*3/uL (ref 145–400)
RBC: 4.32 MIL/uL (ref 3.70–5.45)
RDW: 14.7 % — ABNORMAL HIGH (ref 11.2–14.5)
WBC: 5.8 10*3/uL (ref 3.9–10.3)

## 2017-06-15 LAB — COMPREHENSIVE METABOLIC PANEL
ALBUMIN: 3.4 g/dL — AB (ref 3.5–5.0)
ALK PHOS: 81 U/L (ref 40–150)
ALT: 32 U/L (ref 0–55)
AST: 36 U/L — ABNORMAL HIGH (ref 5–34)
Anion gap: 8 (ref 3–11)
BUN: 9 mg/dL (ref 7–26)
CALCIUM: 9.3 mg/dL (ref 8.4–10.4)
CO2: 23 mmol/L (ref 22–29)
CREATININE: 0.69 mg/dL (ref 0.60–1.10)
Chloride: 110 mmol/L — ABNORMAL HIGH (ref 98–109)
GFR calc Af Amer: >60 mL/min (ref >60)
GFR calc non Af Amer: >60 mL/min (ref >60)
GLUCOSE: 91 mg/dL (ref 70–140)
Potassium: 4.1 mmol/L (ref 3.5–5.1)
Sodium: 141 mmol/L (ref 136–145)
Total Bilirubin: 0.3 mg/dL (ref 0.2–1.2)
Total Protein: 7.1 g/dL (ref 6.4–8.3)

## 2017-06-15 MED ORDER — TAMOXIFEN CITRATE 20 MG PO TABS: 20.0000 mg | ORAL_TABLET | Freq: Every day | ORAL | 3 refills | Status: DC

## 2017-06-15 NOTE — Addendum Note (Signed)
Addended by: Scot Dock on: 06/15/2017 02:52 PM   Modules accepted: Orders

## 2017-06-15 NOTE — Telephone Encounter (Signed)
Patients daughter given detailed instructions per Stress Test Requisition Sheet for test on 06/21/17 at 7:30.Patient Notified to arrive 30 minutes early, and that it is imperative to arrive on time for appointment to keep from having the test rescheduled.  Patient verbalized understanding. Veronia Beets

## 2017-06-15 NOTE — Telephone Encounter (Signed)
Gave avs and calendar ° °

## 2017-06-15 NOTE — Progress Notes (Signed)
[place]  Telephone:([phone]) [phone] Fax:(336) [phone]     ID: Alexandra Beasley DOB: 1965/11/19  MR#: [id number]  WGN#:[id number]  Patient Care Team: Scot Jun, FNP as PCP - General (Family Medicine) Magrinat, Virgie Dad, MD as Consulting Physician (Oncology) Fanny Skates, MD as Consulting Physician (General Surgery) OTHER MD:  CHIEF COMPLAINT: Atypical lobular hyperplasia  CURRENT TREATMENT: Tamoxifen   HISTORY OF CURRENT ILLNESS: From the original intake note:  "Alexandra Beasley" [NEE-[id number]] had bilateral screening mammography for 12/08/2016 at the [facility] Center. There was a possible distortion in the left breast, and on 07/21/2016 she'll she underwent left diagnostic mammography with tomography and left breast ultrasonography. The breast density was category B. This confirmed an area of distortion in the lower inner left breast which was not palpable. Ultrasonography found no definite correlate. The axilla was negative.  Biopsy of this area may 07/08/2016 showed ([id number]) a complex sclerosing lesion. Accordingly the patient was referred to surgery and on 08/31/2016 underwent left lumpectomy, which showed atypical lobular hyperplasia ([id number]). She was then referred to the high risk clinic. There has been some delay because of [facility] approval issues  The patient's subsequent history is as detailed below.  INTERVAL HISTORY: Alexandra Beasley returns today for follow-up of her history of atypical ductal hyperplasia. She is accompanied by her daughter, who are translating for her.  She was switched to Tamoxifen from Anastrozole and started that on 03/2017, and is tolerating it well.  .   Her daughter tells me that she has been trying to call and get her Tamoxifen refilled, however no one has returned her call.  She is frustrated with this.  She also notes that they thought they were supposed to be here today, and not last week, when she was on Dr. Virgie Dad schedule.     REVIEW OF  SYSTEMS: Alexandra Beasley is doing well today.  She is taking the tamoxifen.  She notes a whitish clear vaginal discharge and a swelling feeling in her sides.  She has a h/o arthritis, and does have arthralgias.  She denies any other issues today, and a detailed ROS was non contributory.     PAST MEDICAL HISTORY: Past Medical History:  Diagnosis Date  . Abnormal mammogram of left breast 08/31/2016  . Breast mass, left   . Glaucoma   . Hypertension     PAST SURGICAL HISTORY: Past Surgical History:  Procedure Laterality Date  . ABDOMINAL HYSTERECTOMY    . BREAST LUMPECTOMY WITH RADIOACTIVE SEED LOCALIZATION Left 08/31/2016   Procedure: BREAST LUMPECTOMY WITH RADIOACTIVE SEED LOCALIZATION;  Surgeon: Fanny Skates, MD;  Location: [place];  Service: General;  Laterality: Left;  . CESAREAN SECTION      FAMILY HISTORY Family History  Problem Relation Age of Onset  . Vision loss Mother   . Hypertension Mother   . Heart disease Father   The patient's father is 26 years old and her mother 42 years old as of August 2018. The patient has one brother and one sister. There is no history of breast or ovarian cancer in the family.  GYNECOLOGIC HISTORY:  No LMP recorded. Patient has had a hysterectomy. Menarche age 26, first live birth age 33, the patient is [place] P2. She is status post remote hysterectomy, without salpingo-oophorectomy  SOCIAL HISTORY:  She is originally from [place]. She is married, her husband has a history of head and neck cancer and is disabled as a result. He is followed to [facility]. The patient's daughter  Beni owns 6 convenience stores. The patient's son [name] is studying pharmacy at [employer]. The patient has one granddaughter. She attends a local [employer]    ADVANCED DIRECTIVES:    HEALTH MAINTENANCE: Social History   Tobacco Use  . Smoking status: Never Smoker  . Smokeless tobacco: Never Used  Substance Use Topics  . Alcohol use: No    Alcohol/week:  0.0 oz  . Drug use: No     Colonoscopy:Never  PAP: Status post hysterectomy  Bone density: Never   No Known Allergies  Current Outpatient Medications  Medication Sig Dispense Refill  . amLODipine (NORVASC) 5 MG tablet Take 5 mg by mouth daily.    Marland Kitchen latanoprost (XALATAN) 0.005 % ophthalmic solution Place 1 drop into both eyes daily.    Marland Kitchen omeprazole (PRILOSEC) 40 MG capsule Take 1 capsule (40 mg total) by mouth daily. 30 capsule 3  . tamoxifen (NOLVADEX) 20 MG tablet Take 1 tablet (20 mg total) by mouth daily. 90 tablet 3  . valsartan (DIOVAN) 40 MG tablet Take 1 tablet (40 mg total) by mouth daily. 90 tablet 1   No current facility-administered medications for this visit.     OBJECTIVE:   Vitals:   06/15/17 1247  BP: 127/79  Pulse: (!) 57  Resp: 19  Temp: 98.2 F (36.8 C)  SpO2: 100%     Body mass index is 33.02 kg/m.   Wt Readings from Last 3 Encounters:  06/15/17 186 lb 6.4 oz (84.6 kg)  05/17/17 186 lb 6.4 oz (84.6 kg)  05/12/17 187 lb (84.8 kg)      ECOG FS:1 - Symptomatic but completely ambulatory GENERAL: Patient is a well appearing female in no acute distress HEENT:  Sclerae anicteric.  Oropharynx clear and moist. No ulcerations or evidence of oropharyngeal candidiasis. Neck is supple.  NODES:  No cervical, supraclavicular, or axillary lymphadenopathy palpated.  BREAST EXAM:  Left breast s/p lumpectomy, no nodules, or masses in either breast noted, no skin change or nipple change noted.  Benign bilateral breast exam LUNGS:  Clear to auscultation bilaterally.  No wheezes or rhonchi. HEART:  Regular rate and rhythm. No murmur appreciated. ABDOMEN:  Soft, nontender.  Positive, normoactive bowel sounds. No organomegaly palpated. MSK:  No focal spinal tenderness to palpation. Full range of motion bilaterally in the upper extremities. EXTREMITIES:  No peripheral edema.   SKIN:  Clear with no obvious rashes or skin changes. No nail dyscrasia. NEURO:  Nonfocal. Well  oriented.  Appropriate affect.    LAB RESULTS:  CMP     Component Value Date/Time   NA 141 06/15/2017 1139   NA 141 01/24/2017 1018   K 4.1 06/15/2017 1139   K 4.0 01/24/2017 1018   CL 110 (H) 06/15/2017 1139   CO2 23 06/15/2017 1139   CO2 25 01/24/2017 1018   GLUCOSE 91 06/15/2017 1139   GLUCOSE 94 01/24/2017 1018   BUN 9 06/15/2017 1139   BUN 9.6 01/24/2017 1018   CREATININE 0.69 06/15/2017 1139   CREATININE 0.7 01/24/2017 1018   CALCIUM 9.3 06/15/2017 1139   CALCIUM 9.7 01/24/2017 1018   PROT 7.1 06/15/2017 1139   PROT 7.5 01/24/2017 1018   ALBUMIN 3.4 (L) 06/15/2017 1139   ALBUMIN 3.6 01/24/2017 1018   AST 36 (H) 06/15/2017 1139   AST 26 01/24/2017 1018   ALT 32 06/15/2017 1139   ALT 26 01/24/2017 1018   ALKPHOS 81 06/15/2017 1139   ALKPHOS 87 01/24/2017 1018   BILITOT  0.3 06/15/2017 1139   BILITOT 0.40 01/24/2017 1018   GFRNONAA >60 06/15/2017 1139   GFRNONAA >89 07/04/2016 1115   GFRAA >60 06/15/2017 1139   GFRAA >89 07/04/2016 1115    No results found for: Ronnald Ramp, A1GS, A2GS, BETS, BETA2SER, GAMS, MSPIKE, SPEI  No results found for: Nils Pyle, [facility]  Lab Results  Component Value Date   WBC 5.8 06/15/2017   NEUTROABS 4.1 06/15/2017   HGB 12.2 06/15/2017   HCT 37.1 06/15/2017   MCV 85.7 06/15/2017   PLT 229 06/15/2017      Chemistry      Component Value Date/Time   NA 141 06/15/2017 1139   NA 141 01/24/2017 1018   K 4.1 06/15/2017 1139   K 4.0 01/24/2017 1018   CL 110 (H) 06/15/2017 1139   CO2 23 06/15/2017 1139   CO2 25 01/24/2017 1018   BUN 9 06/15/2017 1139   BUN 9.6 01/24/2017 1018   CREATININE 0.69 06/15/2017 1139   CREATININE 0.7 01/24/2017 1018      Component Value Date/Time   CALCIUM 9.3 06/15/2017 1139   CALCIUM 9.7 01/24/2017 1018   ALKPHOS 81 06/15/2017 1139   ALKPHOS 87 01/24/2017 1018   AST 36 (H) 06/15/2017 1139   AST 26 01/24/2017 1018   ALT 32 06/15/2017 1139   ALT 26 01/24/2017  1018   BILITOT 0.3 06/15/2017 1139   BILITOT 0.40 01/24/2017 1018       No results found for: LABCA2  No components found for: XVQMGQ676  No results for input(s): INR in the last 168 hours.  No results found for: LABCA2  No results found for: PPJ093  No results found for: OIZ124  No results found for: PYK998  No results found for: CA2729  No components found for: HGQUANT  No results found for: CEA1 / No results found for: CEA1   No results found for: AFPTUMOR  No results found for: CHROMOGRNA  No results found for: PSA1  Appointment on 06/15/2017  Component Date Value Ref Range Status  . WBC 06/15/2017 5.8  3.9 - 10.3 K/uL Final  . RBC 06/15/2017 4.32  3.70 - 5.45 MIL/uL Final  . Hemoglobin 06/15/2017 12.2  11.6 - 15.9 g/dL Final  . HCT 06/15/2017 37.1  34.8 - 46.6 % Final  . MCV 06/15/2017 85.7  79.5 - 101.0 fL Final  . MCH 06/15/2017 28.2  25.1 - 34.0 pg Final  . MCHC 06/15/2017 33.0  31.5 - 36.0 g/dL Final  . RDW 06/15/2017 14.7* 11.2 - 14.5 % Final  . Platelets 06/15/2017 229  145 - 400 K/uL Final  . Neutrophils Relative % 06/15/2017 70  % Final  . Neutro Abs 06/15/2017 4.1  1.5 - 6.5 K/uL Final  . Lymphocytes Relative 06/15/2017 21  % Final  . Lymphs Abs 06/15/2017 1.2  0.9 - 3.3 K/uL Final  . Monocytes Relative 06/15/2017 6  % Final  . Monocytes Absolute 06/15/2017 0.4  0.1 - 0.9 K/uL Final  . Eosinophils Relative 06/15/2017 2  % Final  . Eosinophils Absolute 06/15/2017 0.1  0.0 - 0.5 K/uL Final  . Basophils Relative 06/15/2017 1  % Final  . Basophils Absolute 06/15/2017 0.1  0.0 - 0.1 K/uL Final   Performed at [facility], [place] [address] [place], [place] [address]  . Sodium 06/15/2017 141  136 - 145 mmol/L Final  . Potassium 06/15/2017 4.1  3.5 - 5.1 mmol/L Final  . Chloride 06/15/2017 110* 98 -  109 mmol/L Final  . CO2 06/15/2017 23  22 - 29 mmol/L Final  . Glucose, Bld 06/15/2017 91  70 - 140 mg/dL Final  . BUN 06/15/2017  9  7 - 26 mg/dL Final  . Creatinine, Ser 06/15/2017 0.69  0.60 - 1.10 mg/dL Final  . Calcium 06/15/2017 9.3  8.4 - 10.4 mg/dL Final  . Total Protein 06/15/2017 7.1  6.4 - 8.3 g/dL Final  . Albumin 06/15/2017 3.4* 3.5 - 5.0 g/dL Final  . AST 06/15/2017 36* 5 - 34 U/L Final  . ALT 06/15/2017 32  0 - 55 U/L Final  . Alkaline Phosphatase 06/15/2017 81  40 - 150 U/L Final  . Total Bilirubin 06/15/2017 0.3  0.2 - 1.2 mg/dL Final  . GFR calc non Af Amer 06/15/2017 >60  >60 mL/min Final  . GFR calc Af Amer 06/15/2017 >60  >60 mL/min Final   Comment: (NOTE) The eGFR has been calculated using the CKD EPI equation. This calculation has not been validated in all clinical situations. eGFR's persistently <60 mL/min signify possible Chronic Kidney Disease.   Georgiann Hahn gap 06/15/2017 8  3 - 11 Final   Performed at [facility], [place] [address] [place], [place] [address]    (this displays the last labs from the last 3 days)  No results found for: TOTALPROTELP, ALBUMINELP, A1GS, A2GS, BETS, BETA2SER, GAMS, MSPIKE, SPEI (this displays SPEP labs)  No results found for: KPAFRELGTCHN, LAMBDASER, KAPLAMBRATIO (kappa/lambda light chains)  No results found for: HGBA, HGBA2QUANT, HGBFQUANT, HGBSQUAN (Hemoglobinopathy evaluation)   No results found for: LDH  No results found for: IRON, TIBC, IRONPCTSAT (Iron and TIBC)  No results found for: FERRITIN  Urinalysis    Component Value Date/Time   LABSPEC >=1.030 05/12/2017 0942   PHURINE 5.5 05/12/2017 0942   GLUCOSEU NEGATIVE 05/12/2017 0942   HGBUR NEGATIVE 05/12/2017 [place] 05/12/2017 0942   KETONESUR NEGATIVE 05/12/2017 0942   PROTEINUR NEGATIVE 05/12/2017 0942   UROBILINOGEN 0.2 05/12/2017 0942   NITRITE NEGATIVE 05/12/2017 0942   LEUKOCYTESUR NEGATIVE 05/12/2017 0942     STUDIES: We reviewed the [place] and estradiol levels which confirm menopause  ASSESSMENT: 52 y.o. [place] woman status post  left breast lumpectomy 08/31/2016 for atypical lobular hyperplasia  (1) anastrozole started 11/07/2016, discontinued February 07, 2017 with poor tolerance  (2) to start tamoxifen 03/21/17  (a) status post remote hysterectomy, without salpingo-oophorectomy  PLAN: Alexandra Beasley is doing well today. Her labs are stable and I reviewed these with her and her daughter.   She will continue taking Tamoxifen.  She is tolerating it well.  This refill was sent to her pharmacy.  I ordered a mammogram and bone density for 06/2017 at the breast center.  She will return in 6 months for labs and f/u with Dr. Jana Hakim (at their request)  She knows to call for any other reasons that may develop before the next visit.  A total of (30) minutes of face-to-face time was spent with this patient with greater than 50% of that time in counseling and care-coordination.   Wilber Bihari, NP  06/15/17 1:17 PM Medical Oncology and Hematology [facility] [address] [place], [place] [address] Tel. [phone]    Fax. [phone]

## 2017-06-20 ENCOUNTER — Ambulatory Visit (HOSPITAL_COMMUNITY): Payer: Medicaid Other | Attending: Cardiovascular Disease

## 2017-06-20 ENCOUNTER — Other Ambulatory Visit: Payer: Self-pay

## 2017-06-20 DIAGNOSIS — Z8249 Family history of ischemic heart disease and other diseases of the circulatory system: Secondary | ICD-10-CM | POA: Insufficient documentation

## 2017-06-20 DIAGNOSIS — R42 Dizziness and giddiness: Secondary | ICD-10-CM | POA: Diagnosis not present

## 2017-06-20 DIAGNOSIS — I10 Essential (primary) hypertension: Secondary | ICD-10-CM | POA: Diagnosis not present

## 2017-06-20 DIAGNOSIS — I071 Rheumatic tricuspid insufficiency: Secondary | ICD-10-CM | POA: Insufficient documentation

## 2017-06-20 DIAGNOSIS — R9431 Abnormal electrocardiogram [ECG] [EKG]: Secondary | ICD-10-CM | POA: Diagnosis not present

## 2017-06-20 DIAGNOSIS — R609 Edema, unspecified: Secondary | ICD-10-CM | POA: Insufficient documentation

## 2017-06-20 DIAGNOSIS — R0602 Shortness of breath: Secondary | ICD-10-CM | POA: Diagnosis not present

## 2017-06-21 ENCOUNTER — Ambulatory Visit (HOSPITAL_COMMUNITY): Payer: Medicaid Other | Attending: Cardiovascular Disease

## 2017-06-21 ENCOUNTER — Ambulatory Visit (HOSPITAL_BASED_OUTPATIENT_CLINIC_OR_DEPARTMENT_OTHER): Payer: Medicaid Other

## 2017-06-21 DIAGNOSIS — R0602 Shortness of breath: Secondary | ICD-10-CM | POA: Insufficient documentation

## 2017-06-21 DIAGNOSIS — Z8249 Family history of ischemic heart disease and other diseases of the circulatory system: Secondary | ICD-10-CM | POA: Diagnosis not present

## 2017-06-21 DIAGNOSIS — R9431 Abnormal electrocardiogram [ECG] [EKG]: Secondary | ICD-10-CM | POA: Insufficient documentation

## 2017-06-21 DIAGNOSIS — I10 Essential (primary) hypertension: Secondary | ICD-10-CM | POA: Insufficient documentation

## 2017-06-21 MED ORDER — PERFLUTREN LIPID MICROSPHERE
1.0000 mL | INTRAVENOUS | Status: AC | PRN
Start: 2017-06-21 — End: 2017-06-21
  Administered 2017-06-21: 1 mL via INTRAVENOUS
  Administered 2017-06-21 (×2): 2 mL via INTRAVENOUS

## 2017-06-23 ENCOUNTER — Ambulatory Visit (INDEPENDENT_AMBULATORY_CARE_PROVIDER_SITE_OTHER): Payer: Medicaid Other | Admitting: Family Medicine

## 2017-06-23 VITALS — BP 132/66 | HR 84

## 2017-06-23 DIAGNOSIS — E785 Hyperlipidemia, unspecified: Secondary | ICD-10-CM

## 2017-06-23 DIAGNOSIS — Z719 Counseling, unspecified: Secondary | ICD-10-CM

## 2017-06-23 DIAGNOSIS — Z0130 Encounter for examination of blood pressure without abnormal findings: Secondary | ICD-10-CM

## 2017-06-23 DIAGNOSIS — Z130 Encounter for screening for diseases of the blood and blood-forming organs and certain disorders involving the immune mechanism: Secondary | ICD-10-CM

## 2017-06-23 DIAGNOSIS — I10 Essential (primary) hypertension: Secondary | ICD-10-CM

## 2017-06-23 NOTE — Progress Notes (Signed)
Blood pressure today 132/66, no changes in treatment.  Adding Lipid Panel, CBC, CMP for routine screening labs.   Carroll Sage. Kenton Kingfisher, MSN, FNP-C [facility]  [address] [place], [place] [address] [phone]

## 2017-06-23 NOTE — Progress Notes (Signed)
Per provider patient is to continue her current medication and no changes needed.

## 2017-06-24 LAB — CBC WITH DIFFERENTIAL/PLATELET
BASOS: 0 %
Basophils Absolute: 0 10*3/uL (ref 0.0–0.2)
EOS (ABSOLUTE): 0.1 10*3/uL (ref 0.0–0.4)
EOS: 2 %
HEMATOCRIT: 37 % (ref 34.0–46.6)
HEMOGLOBIN: 11.9 g/dL (ref 11.1–15.9)
IMMATURE GRANS (ABS): 0 10*3/uL (ref 0.0–0.1)
Immature Granulocytes: 0 %
LYMPHS ABS: 1.3 10*3/uL (ref 0.7–3.1)
LYMPHS: 22 %
MCH: 27.7 pg (ref 26.6–33.0)
MCHC: 32.2 g/dL (ref 31.5–35.7)
MCV: 86 fL (ref 79–97)
MONOCYTES: 8 %
Monocytes Absolute: 0.5 10*3/uL (ref 0.1–0.9)
NEUTROS ABS: 4 10*3/uL (ref 1.4–7.0)
Neutrophils: 68 %
Platelets: 254 10*3/uL (ref 150–379)
RBC: 4.29 x10E6/uL (ref 3.77–5.28)
RDW: 14.5 % (ref 12.3–15.4)
WBC: 5.9 10*3/uL (ref 3.4–10.8)

## 2017-06-24 LAB — COMPREHENSIVE METABOLIC PANEL
ALBUMIN: 3.8 g/dL (ref 3.5–5.5)
ALT: 32 IU/L (ref 0–32)
AST: 39 IU/L (ref 0–40)
Albumin/Globulin Ratio: 1.4 (ref 1.2–2.2)
Alkaline Phosphatase: 80 IU/L (ref 39–117)
BILIRUBIN TOTAL: 0.3 mg/dL (ref 0.0–1.2)
BUN/Creatinine Ratio: 11 (ref 9–23)
BUN: 7 mg/dL (ref 6–24)
CO2: 21 mmol/L (ref 20–29)
CREATININE: 0.65 mg/dL (ref 0.57–1.00)
Calcium: 9.3 mg/dL (ref 8.7–10.2)
Chloride: 107 mmol/L — ABNORMAL HIGH (ref 96–106)
GFR calc non Af Amer: 102 mL/min/{1.73_m2} (ref >59)
GFR, EST AFRICAN AMERICAN: 117 mL/min/{1.73_m2} (ref >59)
GLOBULIN, TOTAL: 2.8 g/dL (ref 1.5–4.5)
GLUCOSE: 97 mg/dL (ref 65–99)
Potassium: 4.1 mmol/L (ref 3.5–5.2)
SODIUM: 141 mmol/L (ref 134–144)
TOTAL PROTEIN: 6.6 g/dL (ref 6.0–8.5)

## 2017-06-24 LAB — LIPID PANEL
CHOLESTEROL TOTAL: 167 mg/dL (ref 100–199)
Chol/HDL Ratio: 4.3 ratio (ref 0.0–4.4)
HDL: 39 mg/dL — AB (ref >39)
LDL Calculated: 107 mg/dL — ABNORMAL HIGH (ref 0–99)
TRIGLYCERIDES: 105 mg/dL (ref 0–149)
VLDL Cholesterol Cal: 21 mg/dL (ref 5–40)

## 2017-06-25 ENCOUNTER — Telehealth: Payer: Self-pay | Admitting: Family Medicine

## 2017-06-25 MED ORDER — ATORVASTATIN CALCIUM 40 MG PO TABS: 40.0000 mg | ORAL_TABLET | Freq: Every day | ORAL | 3 refills | Status: DC

## 2017-06-25 NOTE — Telephone Encounter (Signed)
LDL ( bad cholesterol is elevated) and good cholesterol HDL is low. I recommend starting atorvastatin 40 mg once daily. [facility]

## 2017-06-26 NOTE — Telephone Encounter (Signed)
Patient notified

## 2017-06-29 ENCOUNTER — Other Ambulatory Visit: Payer: Self-pay | Admitting: Adult Health

## 2017-06-29 DIAGNOSIS — N6092 Unspecified benign mammary dysplasia of left breast: Secondary | ICD-10-CM

## 2017-07-27 ENCOUNTER — Other Ambulatory Visit: Payer: Self-pay | Admitting: Adult Health

## 2017-07-27 ENCOUNTER — Ambulatory Visit
Admission: RE | Admit: 2017-07-27 | Discharge: 2017-07-27 | Disposition: A | Discharge status: Home / Self Care | Payer: Medicaid Other | Source: Ambulatory Visit | Attending: Adult Health | Admitting: Adult Health

## 2017-07-27 ENCOUNTER — Ambulatory Visit: Payer: Self-pay

## 2017-07-27 DIAGNOSIS — Z1231 Encounter for screening mammogram for malignant neoplasm of breast: Secondary | ICD-10-CM

## 2017-07-27 DIAGNOSIS — E2839 Other primary ovarian failure: Secondary | ICD-10-CM

## 2017-07-27 DIAGNOSIS — N6092 Unspecified benign mammary dysplasia of left breast: Secondary | ICD-10-CM

## 2017-08-02 ENCOUNTER — Telehealth: Payer: Self-pay | Admitting: *Deleted

## 2017-08-02 NOTE — Telephone Encounter (Signed)
Received call from pt's daughter asking for report of Mammogram & Bone Density done recently.  Call back # [phone].  Message routed to Wilber Bihari NP

## 2017-08-02 NOTE — Telephone Encounter (Signed)
Please call patient with results. Her mammogram was normal and her bone density was slightly decreased.  Would recommend calcium, vitamin d, and weight bearing exercises.   Wilber Bihari, NP

## 2017-08-02 NOTE — Telephone Encounter (Signed)
Spoke with patient's daughter and gave results of mammogram and bone density.  Gave recommendations by NP.  Daughter voiced understanding and stated she will let her mother know.  Daughter knows to call center with any issues/concerns.

## 2017-08-30 ENCOUNTER — Encounter: Payer: Self-pay | Admitting: Family Medicine

## 2017-08-30 ENCOUNTER — Ambulatory Visit (INDEPENDENT_AMBULATORY_CARE_PROVIDER_SITE_OTHER): Payer: Medicaid Other | Admitting: Family Medicine

## 2017-08-30 VITALS — BP 116/72 | HR 80 | Temp 98.1°F | Ht 63.0 in | Wt 183.0 lb

## 2017-08-30 DIAGNOSIS — Z09 Encounter for follow-up examination after completed treatment for conditions other than malignant neoplasm: Secondary | ICD-10-CM | POA: Diagnosis not present

## 2017-08-30 DIAGNOSIS — I10 Essential (primary) hypertension: Secondary | ICD-10-CM | POA: Diagnosis not present

## 2017-08-30 DIAGNOSIS — R42 Dizziness and giddiness: Secondary | ICD-10-CM

## 2017-08-30 LAB — POCT URINALYSIS DIP (MANUAL ENTRY)
Bilirubin, UA: NEGATIVE
Blood, UA: NEGATIVE
Glucose, UA: NEGATIVE mg/dL
Ketones, POC UA: NEGATIVE mg/dL
Leukocytes, UA: NEGATIVE
Nitrite, UA: NEGATIVE
Protein Ur, POC: NEGATIVE mg/dL
Spec Grav, UA: >=1.03 — AB (ref 1.010–1.025)
Urobilinogen, UA: 0.2 E.U./dL
pH, UA: 5 (ref 5.0–8.0)

## 2017-08-30 MED ORDER — MECLIZINE HCL 25 MG PO TABS: 25.0000 mg | ORAL_TABLET | Freq: Three times a day (TID) | ORAL | 1 refills | Status: DC | PRN

## 2017-08-30 MED ORDER — OLMESARTAN MEDOXOMIL-HCTZ 20-12.5 MG PO TABS: ORAL_TABLET | ORAL | 1 refills | Status: DC

## 2017-08-30 MED ORDER — AMLODIPINE BESYLATE 5 MG PO TABS: 5.0000 mg | ORAL_TABLET | Freq: Every day | ORAL | 1 refills | Status: DC

## 2017-08-30 MED ORDER — OLMESARTAN MEDOXOMIL-HCTZ 20-12.5 MG PO TABS: 1.0000 | ORAL_TABLET | Freq: Every day | ORAL | 1 refills | Status: DC

## 2017-08-30 NOTE — Progress Notes (Signed)
Subjective:    Patient ID: Alexandra Beasley, female    DOB: 28-Nov-1965, 52 y.o.   MRN: [id number]   PCP: Kathe Becton, NP  Chief Complaint  Patient presents with  . Dizziness    1 week     HPI  Alexandra Beasley has a history of Hypertension, Glaucoma, and Breast Mass.  She is here today for follow up.   Current Status: She is doing well with no complaints. She denies fevers, chills, fatigue, recent infections, weight loss, and night sweats. She reports that she has dizziness even when lying down. She has not had any headaches, visual changes, and falls. No chest pain, heart palpitations, cough and shortness of breath reported.   No reports of GI problems. She has no reports of blood in stools, dysuria and hematuria.   No depression or anxiety.   She has no pain today.   Past Medical History:  Diagnosis Date  . Abnormal mammogram of left breast 08/31/2016  . Breast mass, left   . Glaucoma   . Hypertension     Family History  Problem Relation Age of Onset  . Vision loss Mother   . Hypertension Mother   . Heart disease Father     Social History   Socioeconomic History  . Marital status: Married    Spouse name: Not on file  . Number of children: 2  . Years of education: Not on file  . Highest education level: Not on file  Occupational History  . Not on file  Social Needs  . Financial resource strain: Not on file  . Food insecurity:    Worry: Not on file    Inability: Not on file  . Transportation needs:    Medical: Not on file    Non-medical: Not on file  Tobacco Use  . Smoking status: Never Smoker  . Smokeless tobacco: Never Used  Substance and Sexual Activity  . Alcohol use: No    Alcohol/week: 0.0 oz  . Drug use: No  . Sexual activity: Never    Partners: Male  Lifestyle  . Physical activity:    Days per week: Not on file    Minutes per session: Not on file  . Stress: Not on file  Relationships  . Social connections:    Talks on phone: Not on file     Gets together: Not on file    Attends religious service: Not on file    Active member of club or organization: Not on file    Attends meetings of clubs or organizations: Not on file    Relationship status: Not on file  . Intimate partner violence:    Fear of current or ex partner: Not on file    Emotionally abused: Not on file    Physically abused: Not on file    Forced sexual activity: Not on file  Other Topics Concern  . Not on file  Social History Narrative  . Not on file    Past Surgical History:  Procedure Laterality Date  . ABDOMINAL HYSTERECTOMY    . BREAST LUMPECTOMY WITH RADIOACTIVE SEED LOCALIZATION Left 08/31/2016   Procedure: BREAST LUMPECTOMY WITH RADIOACTIVE SEED LOCALIZATION;  Surgeon: Fanny Skates, MD;  Location: [place];  Service: General;  Laterality: Left;  . CESAREAN SECTION     Immunization History  Administered Date(s) Administered  . Influenza,inj,Quad PF,6+ Mos 06/04/2015, 12/28/2015  . Tdap 06/04/2015    Current Meds  Medication Sig  . amLODipine (NORVASC) 5  MG tablet Take 1 tablet (5 mg total) by mouth daily.  Marland Kitchen atorvastatin (LIPITOR) 40 MG tablet Take 1 tablet (40 mg total) by mouth daily at 6 PM.  . Calcium Carbonate-Vitamin D (CALTRATE 600+D PO) Take by mouth.  . Cholecalciferol (VITAMIN D3) 1000 units CAPS Take by mouth.  . latanoprost (XALATAN) 0.005 % ophthalmic solution Place 1 drop into both eyes daily.  Marland Kitchen OLMESARTAN MEDOXOMIL PO Take 10 mg by mouth.  Marland Kitchen omeprazole (PRILOSEC) 40 MG capsule Take 1 capsule (40 mg total) by mouth daily.  . tamoxifen (NOLVADEX) 20 MG tablet Take 1 tablet (20 mg total) by mouth daily.  . [DISCONTINUED] amLODipine (NORVASC) 5 MG tablet Take 5 mg by mouth daily.    No Known Allergies  BP 116/72 (BP Location: Left Arm, Patient Position: Sitting, Cuff Size: Small)   Pulse 80   Temp 98.1 F (36.7 C) (Oral)   Ht 5\' 3"  (1.6 m)   Wt 183 lb (83 kg)   SpO2 98%   BMI 32.42 kg/m   Review of  Systems  Constitutional: Negative.   HENT: Negative.   Eyes: Negative.   Respiratory: Negative.   Cardiovascular: Negative.   Gastrointestinal: Negative.   Endocrine: Negative.   Genitourinary: Negative.   Skin: Negative.   Allergic/Immunologic: Negative.   Neurological: Negative.   Hematological: Negative.   Psychiatric/Behavioral: Negative.        Objective:   Physical Exam  Constitutional: She is oriented to person, place, and time. She appears well-developed and well-nourished.  HENT:  Head: Normocephalic and atraumatic.  Right Ear: External ear normal.  Left Ear: External ear normal.  Nose: Nose normal.  Mouth/Throat: Oropharynx is clear and moist.  Eyes: Pupils are equal, round, and reactive to light. Conjunctivae and EOM are normal.  Neck: Normal range of motion. Neck supple.  Cardiovascular: Normal rate, regular rhythm, normal heart sounds and intact distal pulses.  Pulmonary/Chest: Effort normal and breath sounds normal.  Abdominal: Soft. Bowel sounds are normal.  Musculoskeletal: Normal range of motion.  Neurological: She is alert and oriented to person, place, and time.  Skin: Skin is warm and dry. Capillary refill takes less than 2 seconds.  Psychiatric: She has a normal mood and affect. Her behavior is normal. Judgment and thought content normal.  Nursing note and vitals reviewed.  Assessment & Plan:   1. Dizziness Rx for Meclizine to pharmacy. Urinalysis is stable.   - POCT urinalysis dipstick - meclizine (ANTIVERT) 25 MG tablet; Take 1 tablet (25 mg total) by mouth 3 (three) times daily as needed for dizziness.  Dispense: 30 tablet; Refill: 1  2. Essential hypertension Blood pressure is 116/72 today. She will continue Amlodipine, Diovan, and Benicar/HCTZ as prescribed.  - olmesartan-hydrochlorothiazide (BENICAR HCT) 20-12.5 MG tablet; Take 1/2 tablet by mouth daily.  Dispense: 15 tablet; Refill: 1 - amLODipine (NORVASC) 5 MG tablet; Take 1 tablet (5 mg  total) by mouth daily.  Dispense: 30 tablet; Refill: 1  3. Follow up She will follow up in 1 month.   Meds ordered this encounter  Medications  . meclizine (ANTIVERT) 25 MG tablet    Sig: Take 1 tablet (25 mg total) by mouth 3 (three) times daily as needed for dizziness.    Dispense:  30 tablet    Refill:  1  . DISCONTD: olmesartan-hydrochlorothiazide (BENICAR HCT) 20-12.5 MG tablet    Sig: Take 1 tablet by mouth daily.    Dispense:  15 tablet    Refill:  1  .  olmesartan-hydrochlorothiazide (BENICAR HCT) 20-12.5 MG tablet    Sig: Take 1/2 tablet by mouth daily.    Dispense:  15 tablet    Refill:  1  . amLODipine (NORVASC) 5 MG tablet    Sig: Take 1 tablet (5 mg total) by mouth daily.    Dispense:  30 tablet    Refill:  [place],  MSN, FNP-BC [facility] [address] [place], [place] [address] [phone]

## 2017-08-30 NOTE — Patient Instructions (Signed)
Hydrochlorothiazide, HCTZ; Olmesartan Tablets What is this medicine? HYDROCHLOROTHIAZIDE; OLMESARTAN (hye droe klor oh THYE a zide; all mi SAR tan) is a combination of a diuretic and a drug that relaxes blood vessels. It is used to treat high blood pressure. This medicine may be used for other purposes; ask your health care provider or pharmacist if you have questions. COMMON BRAND NAME(S): Benicar HCT What should I tell my health care provider before I take this medicine? They need to know if you have any of these conditions: -asthma -decreased urine -if you are on a special diet, such as a low salt diet -immune system problems, like lupus -kidney disease -liver disease -an unusual reaction to hydrochlorothiazide, olmesartan, sulfa drugs, other medicines, foods, dyes, or preservatives -pregnant or trying to get pregnant -breast-feeding How should I use this medicine? Take this medicine by mouth with a glass of water. Follow the directions on the prescription label. You can take it with or without food. If it upsets your stomach, take it with food. Take your medicine at regular intervals. Do not take it more often than directed. Do not stop taking except on your doctor's advice. Talk to your pediatrician regarding the use of this medicine in children. Special care may be needed. Overdosage: If you think you have taken too much of this medicine contact a poison control center or emergency room at once. NOTE: This medicine is only for you. Do not share this medicine with others. What if I miss a dose? If you miss a dose, take it as soon as you can. If it is almost time for your next dose, take only that dose. Do not take double or extra doses. What may interact with this medicine? -barbiturates like phenobarbital -corticosteroids like prednisone -diuretics, especially triamterene, spironolactone or amiloride -lithium -medicines for diabetes -medicines for high blood pressure -NSAIDs like  ibuprofen -potassium salts or potassium supplements -prescription pain medicines -skeletal muscle relaxants like tubocurarine -some cholesterol lowering medications like cholestyramine or colestipol This list may not describe all possible interactions. Give your health care provider a list of all the medicines, herbs, non-prescription drugs, or dietary supplements you use. Also tell them if you smoke, drink alcohol, or use illegal drugs. Some items may interact with your medicine. What should I watch for while using this medicine? Check your blood pressure regularly while you are taking this medicine. Ask your doctor or health care professional what your blood pressure should be and when you should contact him or her. When you check your blood pressure, write down the measurements to show your doctor or health care professional. If you are taking this medicine for a long time, you must visit your health care professional for regular checks on your progress. Make sure you schedule appointments on a regular basis. You must not get dehydrated. Ask your doctor or health care professional how much fluid you need to drink a day. Check with him or her if you get an attack of severe diarrhea, nausea and vomiting, or if you sweat a lot. The loss of too much body fluid can make it dangerous for you to take this medicine. Women should inform their doctor if they wish to become pregnant or think they might be pregnant. There is a potential for serious side effects to an unborn child, particularly in the second or third trimester. Talk to your health care professional or pharmacist for more information. You may get drowsy or dizzy. Do not drive, use machinery, or do anything  that needs mental alertness until you know how this drug affects you. Do not stand or sit up quickly, especially if you are an older patient. This reduces the risk of dizzy or fainting spells. Alcohol can make you more drowsy and dizzy. Avoid  alcoholic drinks. This medicine may affect your blood sugar level. If you have diabetes, check with your doctor or health care professional before changing the dose of your diabetic medicine. Avoid salt substitutes unless you are told otherwise by your doctor or health care professional. Do not treat yourself for coughs, colds, or pain while you are taking this medicine without asking your doctor or health care professional for advice. Some ingredients may increase your blood pressure. This medicine can make you more sensitive to the sun. Keep out of the sun. If you cannot avoid being in the sun, wear protective clothing and use sunscreen. Do not use sun lamps or tanning beds/booths. What side effects may I notice from receiving this medicine? Side effects that you should report to your doctor or health care professional as soon as possible: -allergic reactions like skin rash, itching or hives, swelling of the face, lips, or tongue -breathing problems -changes in vision -dark urine -diarrhea -eye pain -fast or irregular heart beat, palpitations, or chest pain -feeling faint or lightheaded -muscle cramps -persistent dry cough -redness, blistering, peeling or loosening of the skin, including inside the mouth -stomach pain -trouble passing urine or change in the amount of urine -unusual bleeding or bruising -vomiting -weight loss -worsened gout pain -yellowing of the eyes or skin Side effects that usually do not require medical attention (report to your doctor or health care professional if they continue or are bothersome): -change in sex drive or performance -headache -nausea This list may not describe all possible side effects. Call your doctor for medical advice about side effects. You may report side effects to FDA at 1-800-FDA-1088. Where should I keep my medicine? Keep out of the reach of children. Store your medicine at room temperature between 20 and 25 degrees C (68 and 77  degrees F). Throw away any unused medicine after the expiration date. NOTE: This sheet is a summary. It may not cover all possible information. If you have questions about this medicine, talk to your doctor, pharmacist, or health care provider.  2018 [employer]vier/Gold Standard (2011-09-21 13:53:44)

## 2017-09-04 ENCOUNTER — Telehealth: Payer: Self-pay

## 2017-09-04 NOTE — Telephone Encounter (Signed)
Patient daughter states that she is still having dizziness and would like to have the referral to neurology

## 2017-09-13 ENCOUNTER — Other Ambulatory Visit: Payer: Self-pay | Admitting: Family Medicine

## 2017-09-13 DIAGNOSIS — R42 Dizziness and giddiness: Secondary | ICD-10-CM

## 2017-09-29 ENCOUNTER — Ambulatory Visit (INDEPENDENT_AMBULATORY_CARE_PROVIDER_SITE_OTHER): Payer: Medicaid Other | Admitting: Family Medicine

## 2017-09-29 ENCOUNTER — Encounter: Payer: Self-pay | Admitting: Family Medicine

## 2017-09-29 VITALS — BP 124/66 | HR 99 | Temp 98.4°F | Ht 63.0 in | Wt 185.0 lb

## 2017-09-29 DIAGNOSIS — I10 Essential (primary) hypertension: Secondary | ICD-10-CM | POA: Diagnosis not present

## 2017-09-29 DIAGNOSIS — R42 Dizziness and giddiness: Secondary | ICD-10-CM | POA: Diagnosis not present

## 2017-09-29 NOTE — Progress Notes (Signed)
Subjective:    Patient ID: Alexandra Beasley, female    DOB: Mar 20, 1966, 52 y.o.   MRN: [id number]   PCP: Kathe Becton, NP  Chief Complaint  Patient presents with  . Follow-up    dizziness   HPI  Alexandra Beasley has a history of Hypertension, Glaucoma, and Breast Mass.  She is here today for follow up.   Current Status: Since her last office visit, she is doing well with no complaints.   She denies fevers, chills, fatigue, recent infections, weight loss, and night sweats.   She reports that she has occasional unsteadiness and dizziness even when lying down. She is scheduled for consultation with Neurologist on 10/2017.  She has not had any headaches, visual changes, and falls.   No chest pain, heart palpitations, cough and shortness of breath reported.   No reports of GI problems. She has no reports of blood in stools, dysuria and hematuria.   No depression or anxiety.   She has no pain today.   Past Medical History:  Diagnosis Date  . Abnormal mammogram of left breast 08/31/2016  . Breast mass, left   . Glaucoma   . Hypertension     Family History  Problem Relation Age of Onset  . Vision loss Mother   . Hypertension Mother   . Heart disease Father     Social History   Socioeconomic History  . Marital status: Married    Spouse name: Not on file  . Number of children: 2  . Years of education: Not on file  . Highest education level: Not on file  Occupational History  . Not on file  Social Needs  . Financial resource strain: Not on file  . Food insecurity:    Worry: Not on file    Inability: Not on file  . Transportation needs:    Medical: Not on file    Non-medical: Not on file  Tobacco Use  . Smoking status: Never Smoker  . Smokeless tobacco: Never Used  Substance and Sexual Activity  . Alcohol use: No    Alcohol/week: 0.0 oz  . Drug use: No  . Sexual activity: Never    Partners: Male  Lifestyle  . Physical activity:    Days per week: Not on file     Minutes per session: Not on file  . Stress: Not on file  Relationships  . Social connections:    Talks on phone: Not on file    Gets together: Not on file    Attends religious service: Not on file    Active member of club or organization: Not on file    Attends meetings of clubs or organizations: Not on file    Relationship status: Not on file  . Intimate partner violence:    Fear of current or ex partner: Not on file    Emotionally abused: Not on file    Physically abused: Not on file    Forced sexual activity: Not on file  Other Topics Concern  . Not on file  Social History Narrative  . Not on file    Past Surgical History:  Procedure Laterality Date  . ABDOMINAL HYSTERECTOMY    . BREAST LUMPECTOMY WITH RADIOACTIVE SEED LOCALIZATION Left 08/31/2016   Procedure: BREAST LUMPECTOMY WITH RADIOACTIVE SEED LOCALIZATION;  Surgeon: Fanny Skates, MD;  Location: [facility];  Service: General;  Laterality: Left;  . CESAREAN SECTION     Immunization History  Administered Date(s) Administered  . Influenza,inj,Quad  PF,6+ Mos 06/04/2015, 12/28/2015  . Tdap 06/04/2015    Current Meds  Medication Sig  . amLODipine (NORVASC) 5 MG tablet Take 1 tablet (5 mg total) by mouth daily.  Marland Kitchen atorvastatin (LIPITOR) 40 MG tablet Take 1 tablet (40 mg total) by mouth daily at 6 PM.  . Calcium Carbonate-Vitamin D (CALTRATE 600+D PO) Take by mouth.  . Cholecalciferol (VITAMIN D3) 1000 units CAPS Take by mouth.  . latanoprost (XALATAN) 0.005 % ophthalmic solution Place 1 drop into both eyes daily.  . meclizine (ANTIVERT) 25 MG tablet Take 1 tablet (25 mg total) by mouth 3 (three) times daily as needed for dizziness.  Marland Kitchen olmesartan-hydrochlorothiazide (BENICAR HCT) 20-12.5 MG tablet Take 1/2 tablet by mouth daily.  Marland Kitchen omeprazole (PRILOSEC) 40 MG capsule Take 1 capsule (40 mg total) by mouth daily.  . tamoxifen (NOLVADEX) 20 MG tablet Take 1 tablet (20 mg total) by mouth daily.  .  [DISCONTINUED] amLODipine (NORVASC) 5 MG tablet Take 1 tablet (5 mg total) by mouth daily.  . [DISCONTINUED] meclizine (ANTIVERT) 25 MG tablet Take 1 tablet (25 mg total) by mouth 3 (three) times daily as needed for dizziness.  . [DISCONTINUED] OLMESARTAN MEDOXOMIL PO Take 10 mg by mouth.  . [DISCONTINUED] olmesartan-hydrochlorothiazide (BENICAR HCT) 20-12.5 MG tablet Take 1/2 tablet by mouth daily.    No Known Allergies  BP 124/66 (BP Location: Left Arm, Patient Position: Sitting, Cuff Size: Small)   Pulse 99   Temp 98.4 F (36.9 C) (Oral)   Ht 5\' 3"  (1.6 m)   Wt 185 lb (83.9 kg)   SpO2 (!) 84%   BMI 32.77 kg/m   Review of Systems  Constitutional: Negative.   HENT: Negative.   Eyes: Negative.   Respiratory: Negative.   Cardiovascular: Negative.   Gastrointestinal: Negative.   Endocrine: Negative.   Genitourinary: Negative.   Skin: Negative.   Allergic/Immunologic: Negative.   Neurological: Positive for dizziness.  Hematological: Negative.   Psychiatric/Behavioral: Negative.    Objective:   Physical Exam  Constitutional: She is oriented to person, place, and time. She appears well-developed and well-nourished.  HENT:  Head: Normocephalic and atraumatic.  Right Ear: External ear normal.  Left Ear: External ear normal.  Nose: Nose normal.  Mouth/Throat: Oropharynx is clear and moist.  Eyes: Pupils are equal, round, and reactive to light. Conjunctivae and EOM are normal.  Neck: Normal range of motion. Neck supple.  Cardiovascular: Normal rate, regular rhythm, normal heart sounds and intact distal pulses.  Pulmonary/Chest: Effort normal and breath sounds normal.  Abdominal: Soft. Bowel sounds are normal.  Musculoskeletal: Normal range of motion.  Neurological: She is alert and oriented to person, place, and time.  Skin: Skin is warm and dry. Capillary refill takes less than 2 seconds.  Psychiatric: She has a normal mood and affect. Her behavior is normal. Judgment and  thought content normal.  Nursing note and vitals reviewed.  Assessment & Plan:   1. Dizziness We will refill Meclizine today. Urinalysis reveals mild dehydration. She will increase fluid intake and avoid extreme hot weather.  She will see initial consult with Neurologist on 10/2017 for evaluation of dizziness.  - POCT urinalysis dipstick - meclizine (ANTIVERT) 25 MG tablet; Take 1 tablet (25 mg total) by mouth 3 (three) times daily as needed for dizziness.  Dispense: 30 tablet; Refill: 1  2. Essential hypertension Blood pressure is 124/66 today. She will continue Amlodipine, Diovan, and Benicar/HCTZ as prescribed. We will refill blood pressure medications today.  - olmesartan-hydrochlorothiazide (  BENICAR HCT) 20-12.5 MG tablet; Take 1/2 tablet by mouth daily.  Dispense: 15 tablet; Refill: 1 - amLODipine (NORVASC) 5 MG tablet; Take 1 tablet (5 mg total) by mouth daily.  Dispense: 30 tablet; Refill: 1  3. Follow up She will follow up in 3 months.   Meds ordered this encounter  Medications  . meclizine (ANTIVERT) 25 MG tablet    Sig: Take 1 tablet (25 mg total) by mouth 3 (three) times daily as needed for dizziness.    Dispense:  90 tablet    Refill:  2  . olmesartan-hydrochlorothiazide (BENICAR HCT) 20-12.5 MG tablet    Sig: Take 1/2 tablet by mouth daily.    Dispense:  15 tablet    Refill:  2  . amLODipine (NORVASC) 5 MG tablet    Sig: Take 1 tablet (5 mg total) by mouth daily.    Dispense:  30 tablet    Refill:  2    Kathe Becton,  MSN, FNP-BC [facility] [address] [place], [place] [address] [phone]

## 2017-10-01 MED ORDER — OLMESARTAN MEDOXOMIL-HCTZ 20-12.5 MG PO TABS: ORAL_TABLET | ORAL | 2 refills | Status: DC

## 2017-10-01 MED ORDER — AMLODIPINE BESYLATE 5 MG PO TABS: 5.0000 mg | ORAL_TABLET | Freq: Every day | ORAL | 2 refills | Status: DC

## 2017-10-01 MED ORDER — MECLIZINE HCL 25 MG PO TABS: 25.0000 mg | ORAL_TABLET | Freq: Three times a day (TID) | ORAL | 2 refills | Status: DC | PRN

## 2017-11-07 ENCOUNTER — Ambulatory Visit: Payer: Medicaid Other | Admitting: Neurology

## 2017-11-07 ENCOUNTER — Encounter: Payer: Self-pay | Admitting: Neurology

## 2017-11-07 ENCOUNTER — Telehealth: Payer: Self-pay | Admitting: Neurology

## 2017-11-07 VITALS — BP 111/69 | HR 74 | Ht 63.0 in | Wt 180.0 lb

## 2017-11-07 DIAGNOSIS — Z87898 Personal history of other specified conditions: Secondary | ICD-10-CM | POA: Diagnosis not present

## 2017-11-07 DIAGNOSIS — H81399 Other peripheral vertigo, unspecified ear: Secondary | ICD-10-CM | POA: Diagnosis not present

## 2017-11-07 DIAGNOSIS — N6092 Unspecified benign mammary dysplasia of left breast: Secondary | ICD-10-CM | POA: Diagnosis not present

## 2017-11-07 NOTE — Patient Instructions (Signed)
I am pleased to hear that your dizziness has resolved. You may have had a bout of vertigo. Thankfully, your neurological exam is normal as well. Nevertheless, I would like to suggest a brain scan, called MRI, to rule out any structural cause of your symptoms. We can call you with the test results when they are available. As discussed, we will have to schedule you for this on a separate date. This test requires authorization from your insurance, and we will take care of the insurance process for you and call you.  You can follow-up with me as needed. Please try to hydrate better with water, as you have a tendency to not drink enough.

## 2017-11-07 NOTE — Progress Notes (Signed)
Subjective:    Patient ID: Aalyssa Elderkin is a 52 y.o. female.  HPI     Star Age, MD, PhD [facility] [address], [address] [place], [place] [address]  Dear Lanelle Bal,   I saw your patient, Belia Febo, upon your kind request, in my neurologic clinic today for initial consultation of her dizziness. The patient is accompanied by her daughter today, who helps with translation and we were able to communicate some in Hindi as well. As you know, Ms. Fronek is a 52 year old right-handed woman with an underlying medical history of hypertension, left breast mass, glaucoma and obesity, who reports intermittent unsteadiness and feeling of spinning especially when crouching down or changing head position from side to side. I reviewed your office note from 09/29/2017. She has been treated symptomatically with meclizine. She reports that the meclizine has helped resolve her symptoms. She felt that her symptoms were primarily when she was lying down to sleep and changing positions from side to side. She is married and lives with her husband, they have 2 children. She is a nonsmoker and does not utilize alcohol and does not utilize caffeine on a regular basis. Of note, she has a tendency to not drink enough water she admits. She works part-time as a Programme researcher, broadcasting/film/video. She denies any recurrent headaches. She denies any one-sided weakness or numbness, tingling or slurring of speech or droopy face. Her symptoms lasted nearly a month, started about 2 months ago. She did have some vague headaches around that time as well. Of note, she had a lumpectomy about a year ago for a breast lesion. She is on tamoxifen for high risk for breast cancer. She snores, quite loudly at times but has no problems with apneas per family report. She has never woken up with his sense of gasping for air, does not have any nocturnal or morning headaches, does not have night to night nocturia and feels that she is  resting quite well. Her Epworth sleepiness score is 5 out of 24, fatigue score is 28 out of 63. She currently feels at baseline. She also reports that when she misses her blood pressure medication she can have a pressure-like headache which then reminds her that she had missed her blood pressure medication.  Her Past Medical History Is Significant For: Past Medical History:  Diagnosis Date  . Abnormal mammogram of left breast 08/31/2016  . Breast mass, left   . Glaucoma   . Hypertension     Her Past Surgical History Is Significant For: Past Surgical History:  Procedure Laterality Date  . ABDOMINAL HYSTERECTOMY    . BREAST LUMPECTOMY WITH RADIOACTIVE SEED LOCALIZATION Left 08/31/2016   Procedure: BREAST LUMPECTOMY WITH RADIOACTIVE SEED LOCALIZATION;  Surgeon: Fanny Skates, MD;  Location: [facility];  Service: General;  Laterality: Left;  . CESAREAN SECTION      Her Family History Is Significant For: Family History  Problem Relation Age of Onset  . Vision loss Mother   . Hypertension Mother   . Heart disease Father     Her Social History Is Significant For: Social History   Socioeconomic History  . Marital status: Married    Spouse name: Not on file  . Number of children: 2  . Years of education: Not on file  . Highest education level: Not on file  Occupational History  . Not on file  Social Needs  . Financial resource strain: Not on file  . Food insecurity:  Worry: Not on file    Inability: Not on file  . Transportation needs:    Medical: Not on file    Non-medical: Not on file  Tobacco Use  . Smoking status: Never Smoker  . Smokeless tobacco: Never Used  Substance and Sexual Activity  . Alcohol use: No    Alcohol/week: 0.0 standard drinks  . Drug use: No  . Sexual activity: Never    Partners: Male  Lifestyle  . Physical activity:    Days per week: Not on file    Minutes per session: Not on file  . Stress: Not on file  Relationships  .  Social connections:    Talks on phone: Not on file    Gets together: Not on file    Attends religious service: Not on file    Active member of club or organization: Not on file    Attends meetings of clubs or organizations: Not on file    Relationship status: Not on file  Other Topics Concern  . Not on file  Social History Narrative  . Not on file    Her Allergies Are:  No Known Allergies:   Her Current Medications Are:  Outpatient Encounter Medications as of 11/07/2017  Medication Sig  . amLODipine (NORVASC) 5 MG tablet Take 1 tablet (5 mg total) by mouth daily.  Marland Kitchen atorvastatin (LIPITOR) 40 MG tablet Take 1 tablet (40 mg total) by mouth daily at 6 PM.  . Calcium Carbonate-Vitamin D (CALTRATE 600+D PO) Take by mouth.  . Cholecalciferol (VITAMIN D3) 1000 units CAPS Take by mouth.  . latanoprost (XALATAN) 0.005 % ophthalmic solution Place 1 drop into both eyes daily.  . meclizine (ANTIVERT) 25 MG tablet Take 1 tablet (25 mg total) by mouth 3 (three) times daily as needed for dizziness.  Marland Kitchen olmesartan-hydrochlorothiazide (BENICAR HCT) 20-12.5 MG tablet Take 1/2 tablet by mouth daily.  Marland Kitchen omeprazole (PRILOSEC) 40 MG capsule Take 1 capsule (40 mg total) by mouth daily.  . tamoxifen (NOLVADEX) 20 MG tablet Take 1 tablet (20 mg total) by mouth daily.  . valsartan (DIOVAN) 40 MG tablet Take 1 tablet (40 mg total) by mouth daily.   No facility-administered encounter medications on file as of 11/07/2017.   : Review of Systems:  Out of a complete 14 point review of systems, all are reviewed and negative with the exception of these symptoms as listed below:  Review of Systems  Neurological:       Pt presents today to discuss her dizziness. Pt's dizziness has resolved since taking meclizine over this past month. Pt has never had a sleep study but does endorse snoring.  Epworth Sleepiness Scale 0= would never doze 1= slight chance of dozing 2= moderate chance of dozing 3= high chance of  dozing  Sitting and reading: 0 Watching TV: 3 Sitting inactive in a public place (ex. Theater or meeting): 0 As a passenger in a car for an hour without a break: 1 Lying down to rest in the afternoon: 1 Sitting and talking to someone: 0 Sitting quietly after lunch (no alcohol): 0 In a car, while stopped in traffic: 0 Total: 5    Objective:  Neurological Exam  Physical Exam Physical Examination:   Vitals:   11/07/17 0947  BP: 111/69  Pulse: 74   Orthostatic vital signs testing she has no significant drop in blood pressure and adequate response to blood pressure and pulse changes. She reports no orthostatic lightheadedness.  General Examination: The patient is  a very pleasant 52 y.o. female in no acute distress. She appears well-developed and well-nourished and well groomed.   HEENT: Normocephalic, atraumatic, pupils are equal, round and reactive to light and accommodation. Corrective eyeglasses in place. Extraocular tracking is good without limitation to gaze excursion or nystagmus noted. Normal smooth pursuit is noted. Hearing is grossly intact. Tympanic membranes are clear bilaterally. Face is symmetric with normal facial animation and normal facial sensation. Speech is clear with no dysarthria noted. There is no hypophonia. There is no lip, neck/head, jaw or voice tremor. Neck is supple with full range of passive and active motion. There are no carotid bruits on auscultation. Oropharynx exam reveals: mild mouth dryness, adequate dental hygiene and mild airway crowding, due to tonsils in place, smaller airway entry. Tongue protrudes centrally and palate elevates symmetrically. She has no vertiginous symptoms upon sudden changes of her head position.   Chest: Clear to auscultation without wheezing, rhonchi or crackles noted.  Heart: S1+S2+0, regular and normal without murmurs, rubs or gallops noted.   Abdomen: Soft, non-tender and non-distended with normal bowel sounds appreciated  on auscultation.  Extremities: There is no pitting edema in the distal lower extremities bilaterally. Pedal pulses are intact.  Skin: Warm and dry without trophic changes noted.  Musculoskeletal: exam reveals no obvious joint deformities, tenderness or joint swelling or erythema.   Neurologically:  Mental status: The patient is awake, alert and oriented in all 4 spheres. Her immediate and remote memory, attention, language skills and fund of knowledge are appropriate. There is no evidence of aphasia, agnosia, apraxia or anomia. Speech is clear with normal prosody and enunciation. Thought process is linear. Mood is normal and affect is normal.  Cranial nerves II - XII are as described above under HEENT exam. In addition: shoulder shrug is normal with equal shoulder height noted. Motor exam: Normal bulk, strength and tone is noted. There is no drift, tremor or rebound. Romberg is negative. Reflexes are 2+ throughout. Babinski: Toes are flexor bilaterally. Fine motor skills and coordination: intact with normal finger taps, normal hand movements, normal rapid alternating patting, normal foot taps and normal foot agility.  Cerebellar testing: No dysmetria or intention tremor on finger to nose testing. Heel to shin is unremarkable bilaterally. There is no truncal or gait ataxia.  Sensory exam: intact to light touch in the upper and lower extremities.  Gait, station and balance: She stands easily. No veering to one side is noted. No leaning to one side is noted. Posture is age-appropriate and stance is narrow based. Gait shows normal stride length and normal pace. No problems turning are noted. Tandem walk is unremarkable.   Assessment and Plan:   In summary, Shajuana Mclucas is a very pleasant 52 y.o.-year old female with an underlying medical history of hypertension, left breast mass, glaucoma and obesity, who presents for evaluation of her intermittent dizzy spells. Since her episodes happened with  sudden head or body position, she likely had a bout of vertigo. Her symptoms have since then resolved. She is reminded to change positions slowly and stay better hydrated with water as she has a tendency to not hydrate enough. Her neurological exam is reassuring and nonfocal area she is reminded to take all her medications on time. Her blood pressure and vital signs were unremarkable today especially with orthostatic testing. She had symptomatic relief with meclizine. I would like to proceed with a brain MRI to rule out a structural cause of her symptoms especially in light of her  history of headache at the time of her dizzy spell, and her history of breast lesion. We will request insurance authorization and call her to schedule the scan and we will call her with results as well. So long as her MRI shows normal and age-appropriate findings, I would be happy to see her back on an as-needed basis. I answered all their questions today and the patient and her daughter were in agreement. Thank you very much for allowing me to participate in the care of this nice patient. If I can be of any further assistance to you please do not hesitate to call me at [phone].  Sincerely,   Star Age, MD, PhD

## 2017-11-07 NOTE — Telephone Encounter (Signed)
medicaid order sent to [facility]. They obtain the auth and will reach out to the pt to schedule.  °

## 2017-11-30 ENCOUNTER — Ambulatory Visit
Admission: RE | Admit: 2017-11-30 | Discharge: 2017-11-30 | Disposition: A | Discharge status: Home / Self Care | Payer: Medicaid Other | Source: Ambulatory Visit | Attending: Neurology | Admitting: Neurology

## 2017-11-30 DIAGNOSIS — H81399 Other peripheral vertigo, unspecified ear: Secondary | ICD-10-CM | POA: Diagnosis not present

## 2017-11-30 MED ORDER — GADOBENATE DIMEGLUMINE 529 MG/ML IV SOLN
18.0000 mL | Freq: Once | INTRAVENOUS | Status: AC | PRN
  Administered 2017-11-30: 18 mL via INTRAVENOUS

## 2017-12-01 ENCOUNTER — Telehealth: Payer: Self-pay | Admitting: *Deleted

## 2017-12-01 NOTE — Progress Notes (Signed)
Please call pt or daughter re: normal findings on recent Brain MRI, with and wo contrast, which is of course good news. Can FU as needed.  Star Age, MD, PhD [facility])

## 2017-12-01 NOTE — Telephone Encounter (Signed)
-----   Message from Star Age, MD sent at 12/01/2017  8:23 AM EDT ----- Please call pt or daughter re: normal findings on recent Brain MRI, with and wo contrast, which is of course good news. Can FU as needed.  Star Age, MD, PhD [facility])

## 2017-12-01 NOTE — Telephone Encounter (Signed)
Called daughter and discussed MRI findings per Dr. Rexene Alberts note. Daughter verbalized understanding.

## 2017-12-10 IMAGING — CR DG HAND COMPLETE 3+V*L*
3 series · 3 of 3 positions shown · non-contrast
Comparison: Left wrist radiograph same day

CLINICAL DATA: Left hand pain.  Left second metacarpal pain.

EXAM:
LEFT HAND - COMPLETE 3+ VIEW

[x hand pa left]
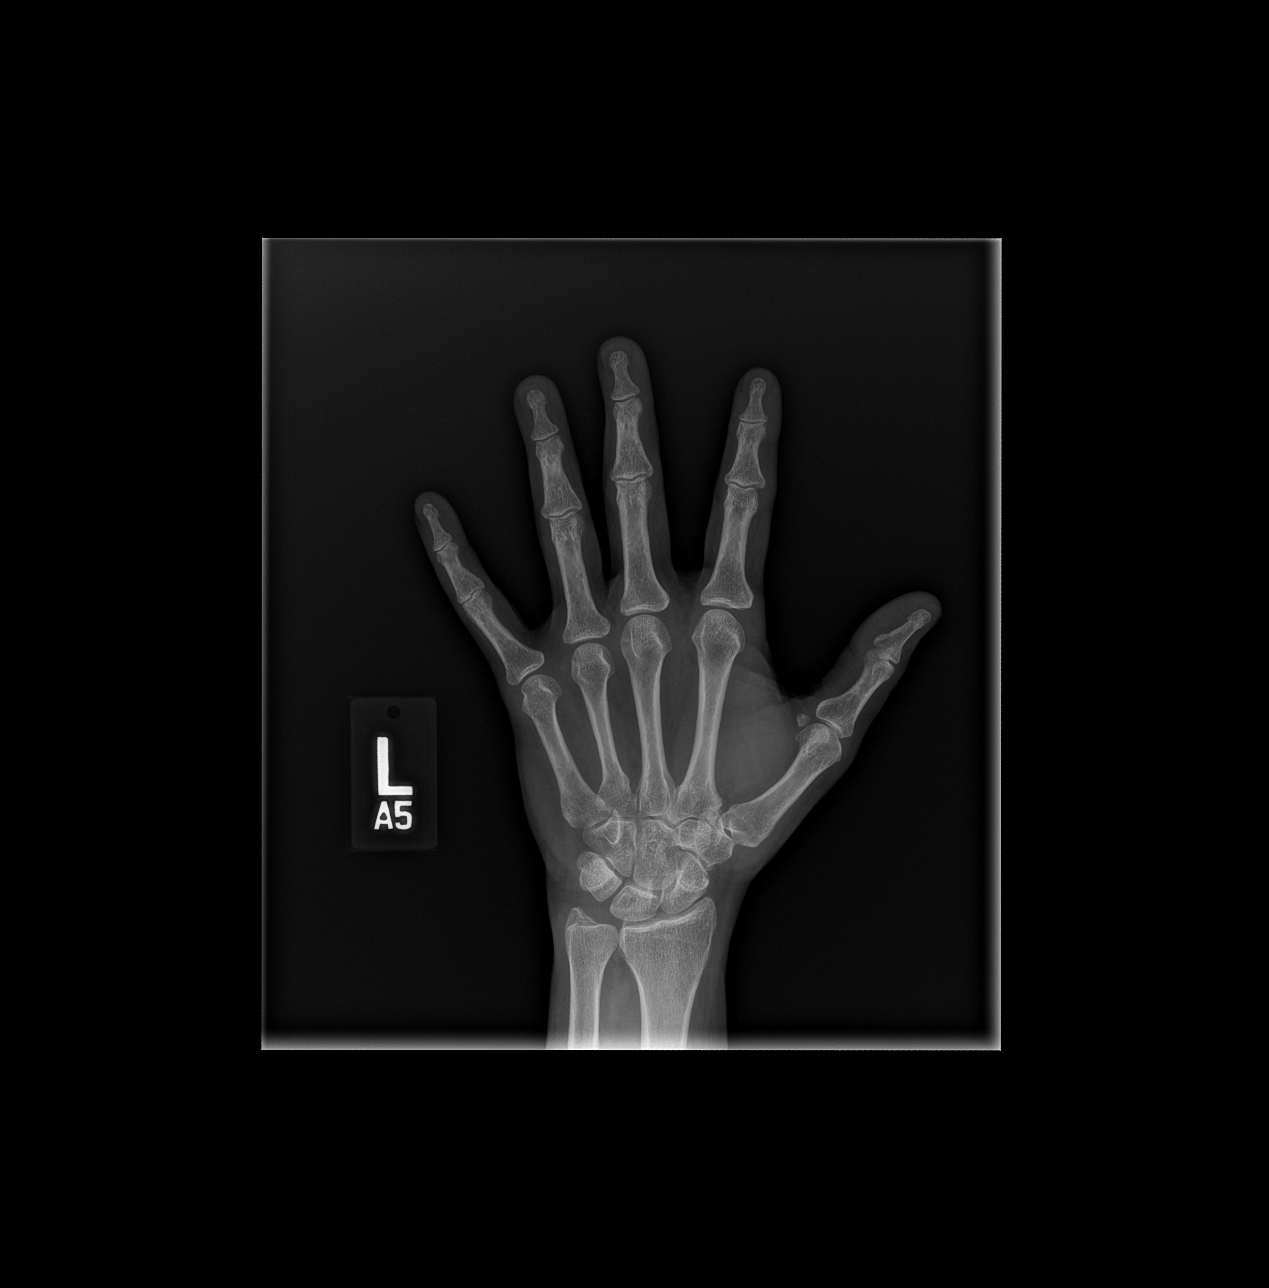

[x hand obl left]
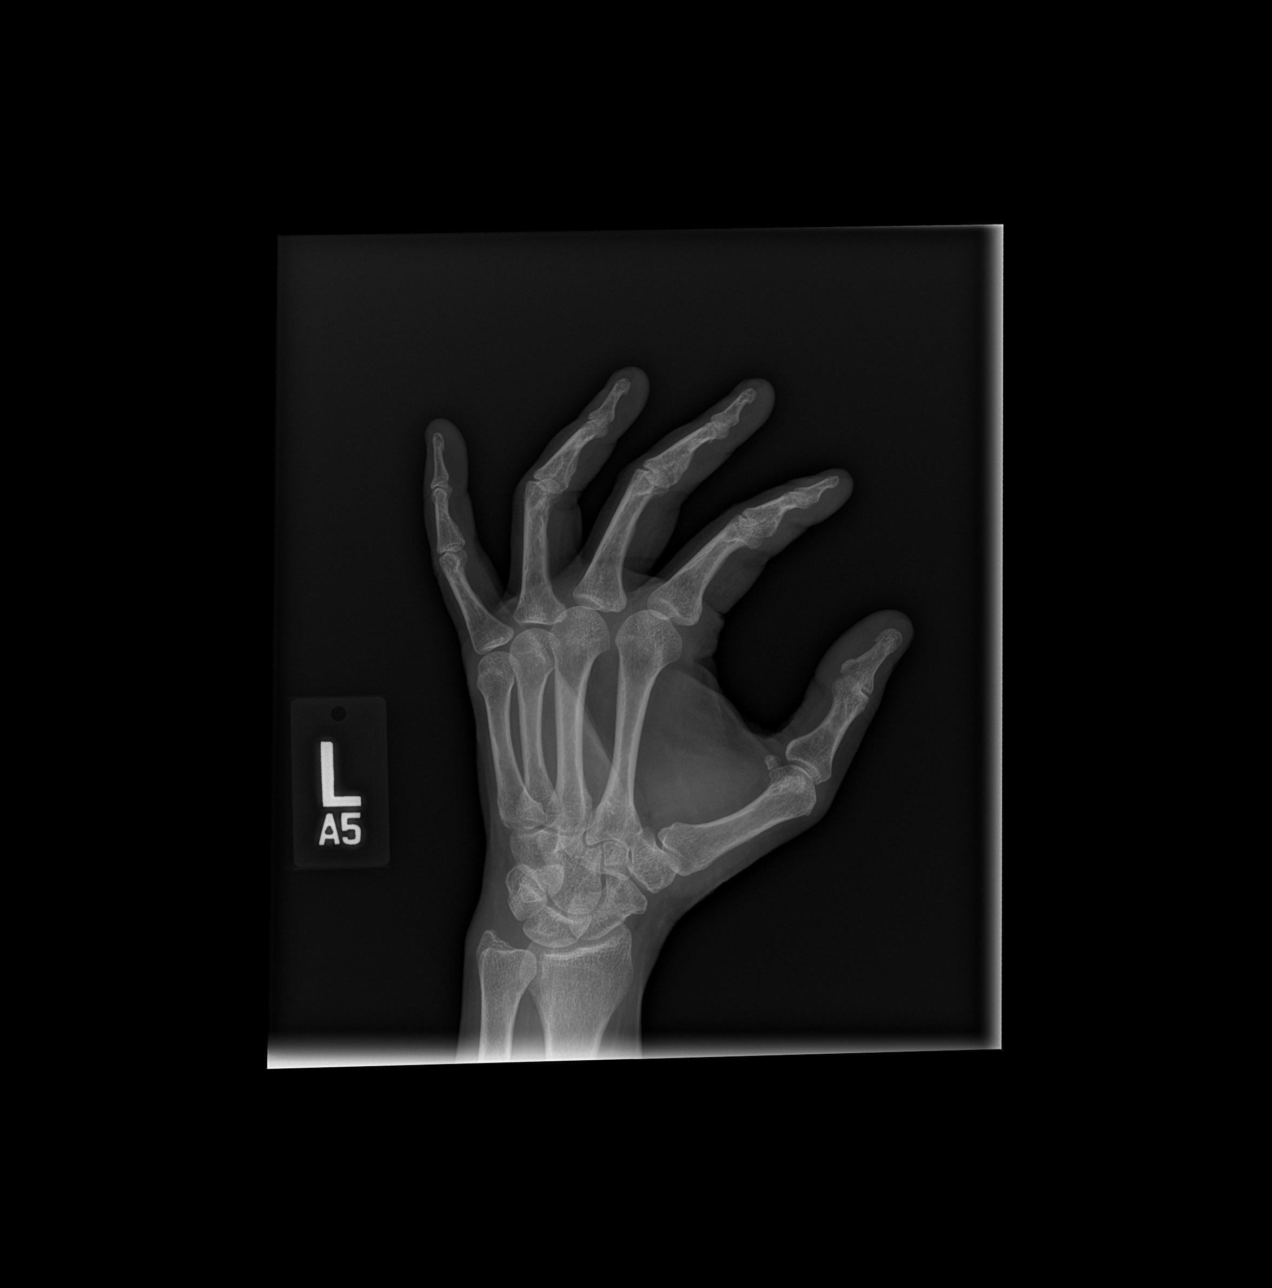

[x hand lat left]
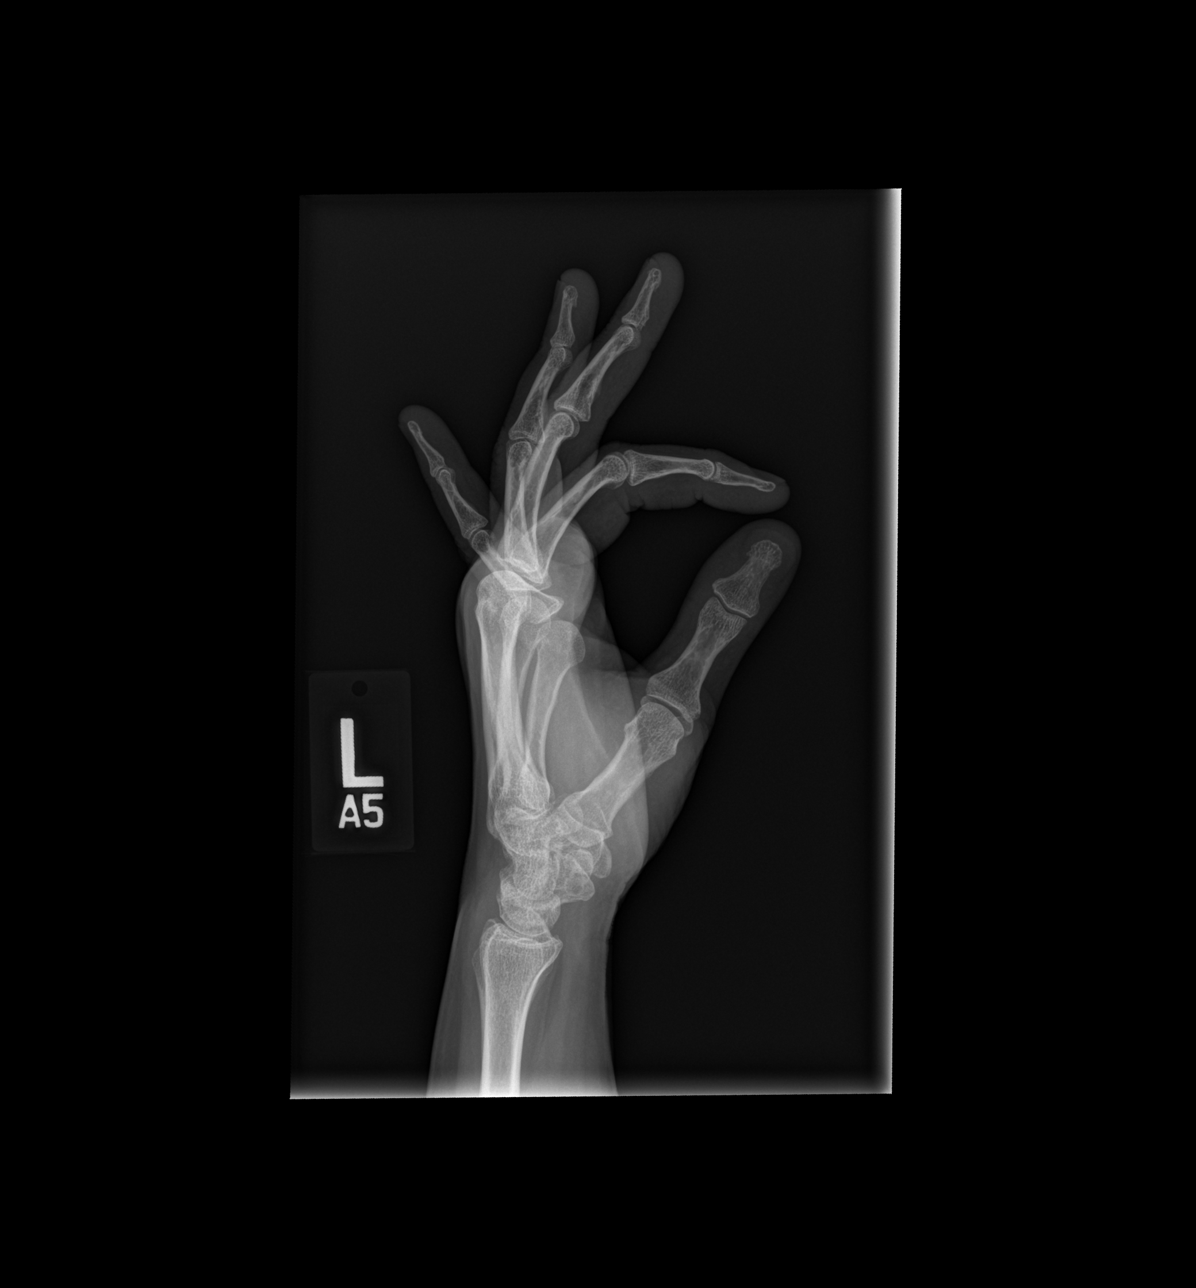

[3 of 3 positions shown; findings below may reference images not displayed]

FINDINGS: There is no evidence of fracture or dislocation. There is no
evidence of arthropathy or other focal bone abnormality. Soft
tissues are unremarkable.
IMPRESSION: No acute fracture or dislocation of the left hand.

## 2017-12-10 IMAGING — CR DG WRIST COMPLETE 3+V*L*
4 series · 4 of 4 positions shown · non-contrast
Comparison: None

CLINICAL DATA: LEFT wrist pain all over since falling at work today

EXAM:
LEFT WRIST - COMPLETE 3+ VIEW

[x wrist pa left]
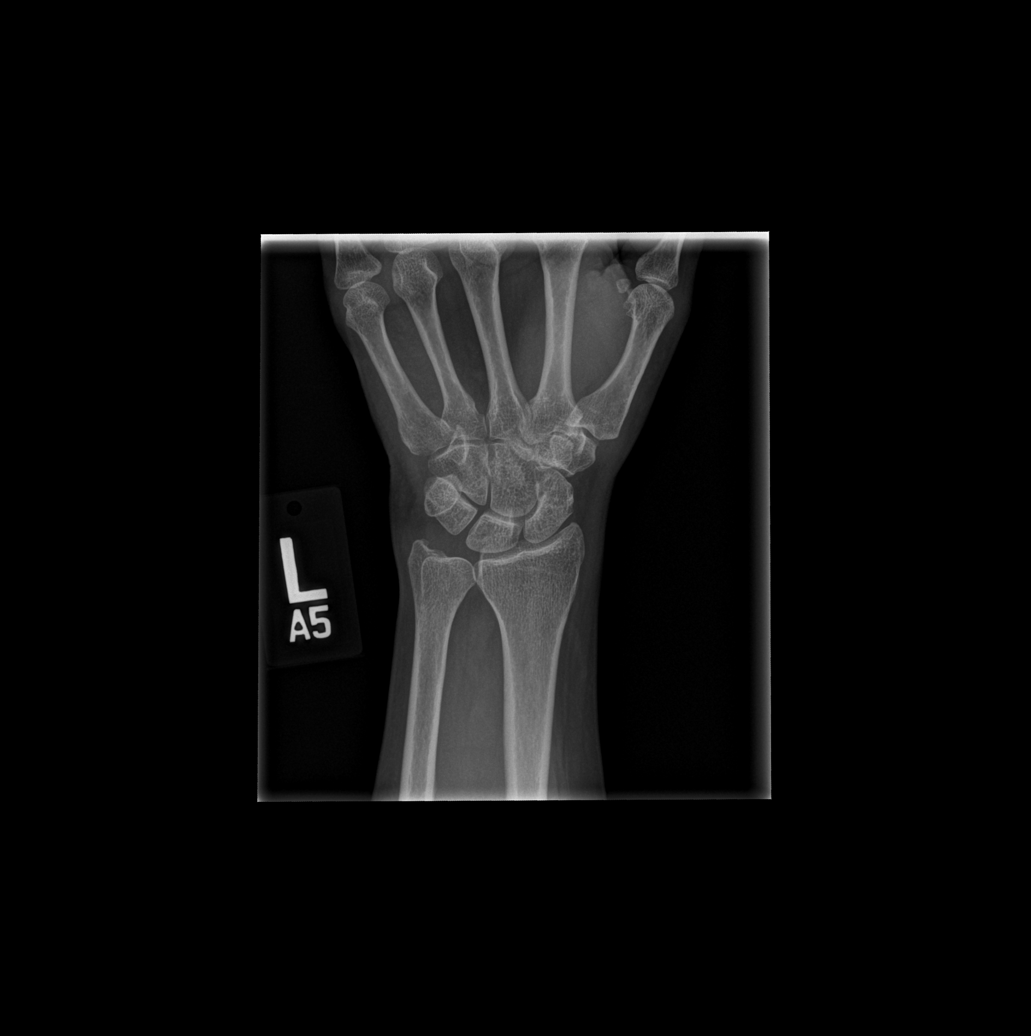

[x wrist obl left]
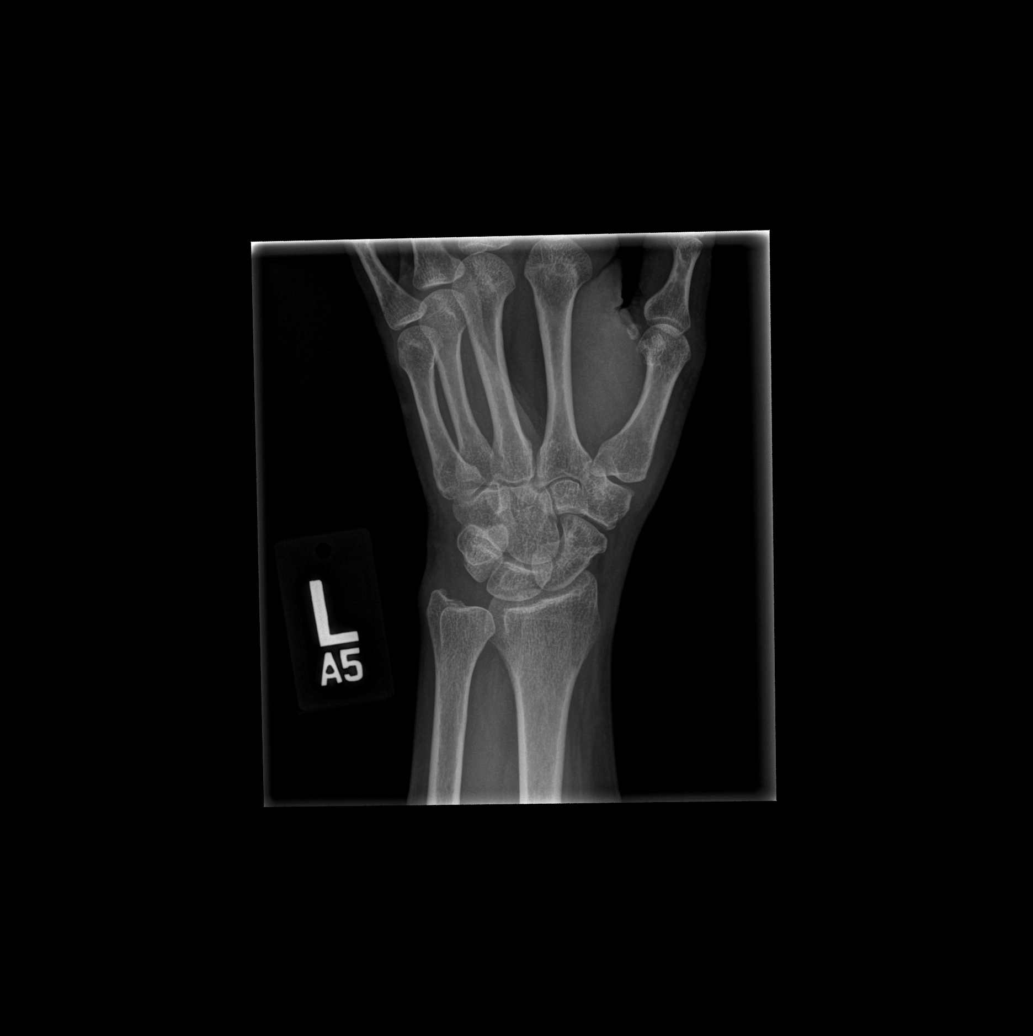

[x wrist lat left]
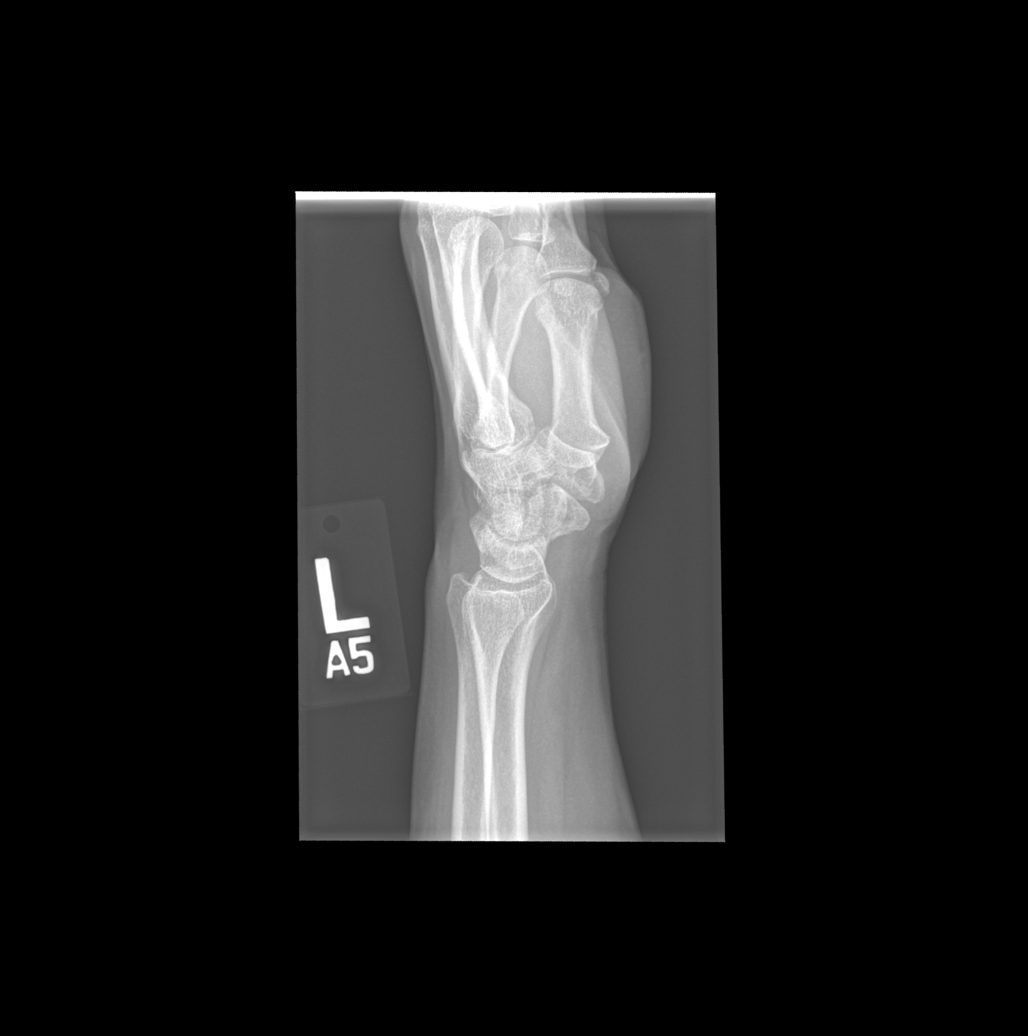

[x wrist navicular view left]
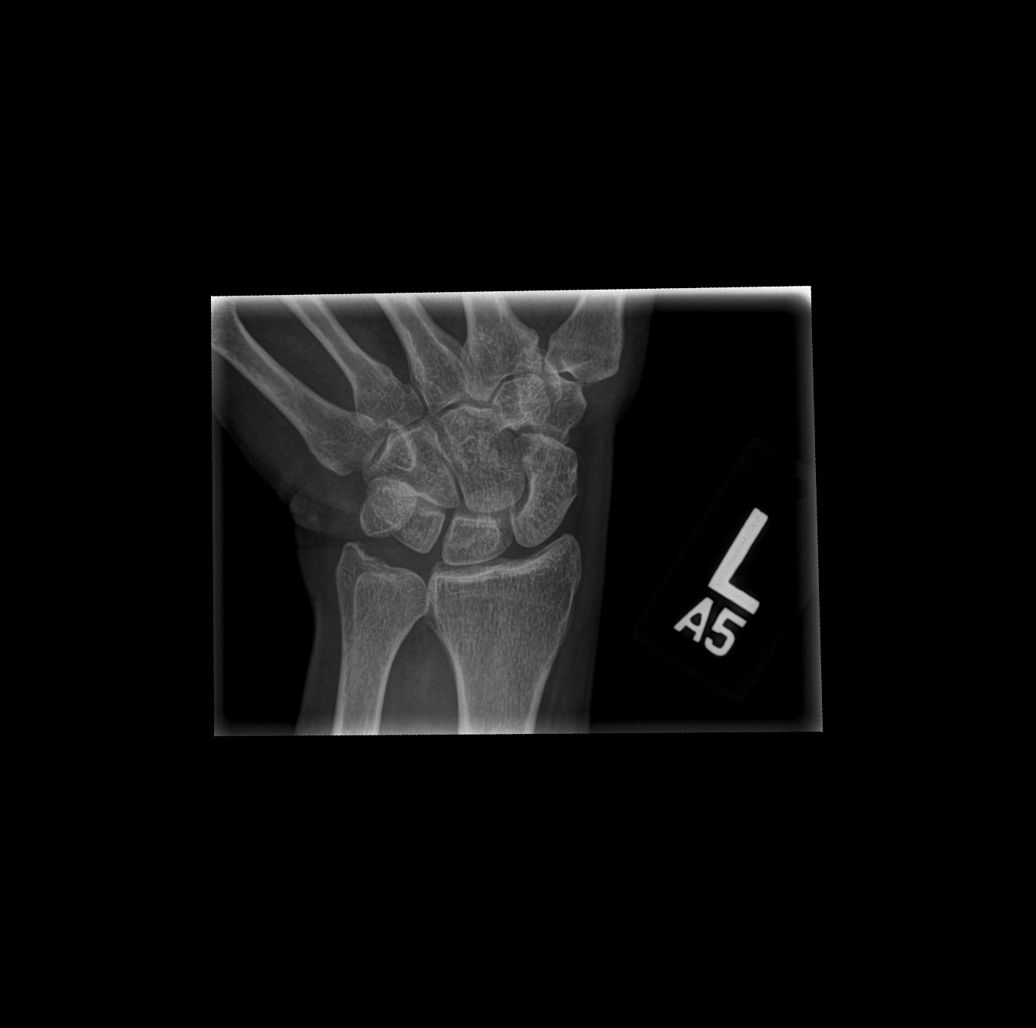

[4 of 4 positions shown; findings below may reference images not displayed]

FINDINGS: Osseous mineralization normal.

Joint spaces preserved.

No fracture, dislocation, or bone destruction.
IMPRESSION: Normal exam.
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
## 2017-12-18 ENCOUNTER — Encounter: Payer: Self-pay | Admitting: Oncology

## 2017-12-18 ENCOUNTER — Inpatient Hospital Stay: Payer: Medicaid Other | Attending: Oncology | Admitting: Oncology

## 2017-12-18 VITALS — BP 113/62 | HR 63 | Temp 97.5°F | Resp 18 | Ht 63.0 in | Wt 183.4 lb

## 2017-12-18 DIAGNOSIS — Z79899 Other long term (current) drug therapy: Secondary | ICD-10-CM | POA: Diagnosis not present

## 2017-12-18 DIAGNOSIS — I10 Essential (primary) hypertension: Secondary | ICD-10-CM

## 2017-12-18 DIAGNOSIS — M8580 Other specified disorders of bone density and structure, unspecified site: Secondary | ICD-10-CM

## 2017-12-18 DIAGNOSIS — R232 Flushing: Secondary | ICD-10-CM | POA: Diagnosis not present

## 2017-12-18 DIAGNOSIS — M810 Age-related osteoporosis without current pathological fracture: Secondary | ICD-10-CM

## 2017-12-18 DIAGNOSIS — Z79810 Long term (current) use of selective estrogen receptor modulators (SERMs): Secondary | ICD-10-CM | POA: Insufficient documentation

## 2017-12-18 DIAGNOSIS — N6092 Unspecified benign mammary dysplasia of left breast: Secondary | ICD-10-CM

## 2017-12-18 DIAGNOSIS — Z780 Asymptomatic menopausal state: Secondary | ICD-10-CM | POA: Insufficient documentation

## 2017-12-18 DIAGNOSIS — Z90710 Acquired absence of both cervix and uterus: Secondary | ICD-10-CM | POA: Insufficient documentation

## 2017-12-18 DIAGNOSIS — N6324 Unspecified lump in the left breast, lower inner quadrant: Secondary | ICD-10-CM | POA: Diagnosis not present

## 2017-12-18 MED ORDER — TAMOXIFEN CITRATE 20 MG PO TABS: 20.0000 mg | ORAL_TABLET | Freq: Every day | ORAL | 3 refills | Status: DC

## 2017-12-18 MED ORDER — ATORVASTATIN CALCIUM 40 MG PO TABS: 40.0000 mg | ORAL_TABLET | Freq: Every day | ORAL | 3 refills | Status: DC

## 2017-12-19 ENCOUNTER — Encounter: Payer: Self-pay | Admitting: Gastroenterology

## 2017-12-28 ENCOUNTER — Telehealth: Payer: Self-pay

## 2017-12-28 NOTE — Telephone Encounter (Signed)
Patient will make appointment on 10/14 at 10am.

## 2018-01-01 ENCOUNTER — Ambulatory Visit (INDEPENDENT_AMBULATORY_CARE_PROVIDER_SITE_OTHER): Payer: Medicaid Other | Admitting: Family Medicine

## 2018-01-01 ENCOUNTER — Encounter: Payer: Self-pay | Admitting: Family Medicine

## 2018-01-01 VITALS — BP 114/66 | HR 72 | Temp 97.9°F | Ht 63.0 in | Wt 184.0 lb

## 2018-01-01 DIAGNOSIS — M79671 Pain in right foot: Secondary | ICD-10-CM

## 2018-01-01 DIAGNOSIS — E785 Hyperlipidemia, unspecified: Secondary | ICD-10-CM

## 2018-01-01 DIAGNOSIS — Z09 Encounter for follow-up examination after completed treatment for conditions other than malignant neoplasm: Secondary | ICD-10-CM

## 2018-01-01 DIAGNOSIS — Z131 Encounter for screening for diabetes mellitus: Secondary | ICD-10-CM

## 2018-01-01 DIAGNOSIS — I10 Essential (primary) hypertension: Secondary | ICD-10-CM

## 2018-01-01 DIAGNOSIS — M79672 Pain in left foot: Secondary | ICD-10-CM | POA: Diagnosis not present

## 2018-01-01 DIAGNOSIS — K219 Gastro-esophageal reflux disease without esophagitis: Secondary | ICD-10-CM | POA: Diagnosis not present

## 2018-01-01 DIAGNOSIS — Z23 Encounter for immunization: Secondary | ICD-10-CM

## 2018-01-01 LAB — POCT GLYCOSYLATED HEMOGLOBIN (HGB A1C): Hemoglobin A1C: 5.6 % (ref 4.0–5.6)

## 2018-01-01 LAB — POCT URINALYSIS DIP (MANUAL ENTRY)
Bilirubin, UA: NEGATIVE
Blood, UA: NEGATIVE
Glucose, UA: NEGATIVE mg/dL
Ketones, POC UA: NEGATIVE mg/dL
Leukocytes, UA: NEGATIVE
Nitrite, UA: NEGATIVE
Protein Ur, POC: NEGATIVE mg/dL
Spec Grav, UA: >=1.03 — AB (ref 1.010–1.025)
Urobilinogen, UA: 0.2 E.U./dL
pH, UA: 5.5 (ref 5.0–8.0)

## 2018-01-01 MED ORDER — VALSARTAN 40 MG PO TABS: 40.0000 mg | ORAL_TABLET | Freq: Every day | ORAL | 2 refills | Status: DC

## 2018-01-01 MED ORDER — OLMESARTAN MEDOXOMIL-HCTZ 20-12.5 MG PO TABS: ORAL_TABLET | ORAL | 2 refills | Status: DC

## 2018-01-01 MED ORDER — OMEPRAZOLE 40 MG PO CPDR: 40.0000 mg | DELAYED_RELEASE_CAPSULE | Freq: Every day | ORAL | 2 refills | Status: DC

## 2018-01-01 MED ORDER — ATORVASTATIN CALCIUM 40 MG PO TABS: 40.0000 mg | ORAL_TABLET | Freq: Every day | ORAL | 2 refills | Status: DC

## 2018-01-01 MED ORDER — AMLODIPINE BESYLATE 5 MG PO TABS: 5.0000 mg | ORAL_TABLET | Freq: Every day | ORAL | 2 refills | Status: DC

## 2018-01-01 MED ORDER — DICLOFENAC SODIUM 1 % TD GEL: 2.0000 g | Freq: Four times a day (QID) | TRANSDERMAL | 6 refills | Status: DC

## 2018-01-01 NOTE — Progress Notes (Signed)
Follow Up  Subjective:    Patient ID: Taegen Lennox, female    DOB: 10-Apr-1965, 52 y.o.   MRN: [id number]   Chief Complaint  Patient presents with  . Follow-up    3 month chronic condition    HPI  Ms. Lippard is a 52 year old female with a past medical history of Hypertension, Glaucoma, and Left Breast Mass. She is here today for follow up.   Current Status: Since her last office visit, she is doing well with no complaints.   Pain in both feet when standing. Pain is worst in the mornings. 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 She is currently does not take any oral medications for pain, but she uses Voltaren Gel for relief. She has not had any headaches, visual changes, dizziness, and falls.  She denies fevers, chills, fatigue, recent infections, weight loss, and night sweats.  No chest pain, heart palpitations, cough and shortness of breath reported. No reports of GI problems such as nausea, vomiting, diarrhea, and constipation. She has no reports of blood in stools, dysuria and hematuria. No depression or anxiety, and denies suicidal ideations, homicidal ideations, or auditory hallucinations. She denies pain today.   Review of Systems  Constitutional: Negative.   HENT: Negative.   Respiratory: Negative.   Cardiovascular: Negative.   Gastrointestinal: Positive for abdominal distention (Obese).  Genitourinary: Negative.   Musculoskeletal: Positive for arthralgias (feet pain).  Skin: Negative.   Neurological: Negative.   Psychiatric/Behavioral: Negative.    Objective:   Physical Exam  Constitutional: She is oriented to person, place, and time. She appears well-developed and well-nourished.  HENT:  Head: Normocephalic and atraumatic.  Neck: Normal range of motion. Neck supple.  Cardiovascular: Normal rate, regular rhythm, normal heart sounds and intact distal pulses.  Pulmonary/Chest: Effort normal and breath sounds normal.  Abdominal: Soft.  Bowel sounds are normal. She exhibits distension (Obese).  Musculoskeletal: Normal range of motion.  Neurological: She is alert and oriented to person, place, and time.  Skin: Skin is warm and dry.  Psychiatric: She has a normal mood and affect. Her behavior is normal. Judgment and thought content normal.  Nursing note and vitals reviewed.  Assessment & Plan:   1. Bilateral foot pain - Ambulatory referral to Podiatry - diclofenac sodium (VOLTAREN) 1 % GEL; Apply 2 g topically 4 (four) times daily.  Dispense: 2 g; Refill: 6  2. Essential hypertension Antihypertensives are effective. Blood pressure is stable at 114/66 today. Continue medications as prescribed. He will continue to decrease high sodium intake, excessive alcohol intake, increase potassium intake, smoking cessation, and increase physical activity of at least 30 minutes of cardio activity daily. She will continue to follow Heart Healthy or DASH diet. - amLODipine (NORVASC) 5 MG tablet; Take 1 tablet (5 mg total) by mouth daily.  Dispense: 90 tablet; Refill: 2 - olmesartan-hydrochlorothiazide (BENICAR HCT) 20-12.5 MG tablet; Take 1/2 tablet by mouth daily.  Dispense: 45 tablet; Refill: 2 - valsartan (DIOVAN) 40 MG tablet; Take 1 tablet (40 mg total) by mouth daily.  Dispense: 90 tablet; Refill: 2  3. Gastroesophageal reflux disease without esophagitis Stable. Continue Prilosec as prescribed.  - omeprazole (PRILOSEC) 40 MG capsule; Take 1 capsule (40 mg total) by mouth daily.  Dispense: 90 capsule; Refill: 2  4. Hyperlipidemia, unspecified hyperlipidemia type Lipid panel stable on 06/23/2017. Continue Atorvastatin as prescribed. He will continue diet low in fats.  - atorvastatin (LIPITOR) 40 MG tablet; Take 1  tablet (40 mg total) by mouth daily at 6 PM.  Dispense: 90 tablet; Refill: 2  5. Need for immunization against influenza - Flu Vaccine QUAD 36+ mos IM  6. Screening for diabetes mellitus Hgb A1c is within normal range of 5.6  today. She will continue to decrease foods/beverages high in sugars and carbs and follow Heart Healthy or DASH diet. Increase physical activity to at least 30 minutes cardio exercise daily.  - POCT glycosylated hemoglobin (Hb A1C) - POCT urinalysis dipstick  7. Follow up She will follow up in 1 month.   Meds ordered this encounter  Medications  . amLODipine (NORVASC) 5 MG tablet    Sig: Take 1 tablet (5 mg total) by mouth daily.    Dispense:  90 tablet    Refill:  2  . atorvastatin (LIPITOR) 40 MG tablet    Sig: Take 1 tablet (40 mg total) by mouth daily at 6 PM.    Dispense:  90 tablet    Refill:  2  . diclofenac sodium (VOLTAREN) 1 % GEL    Sig: Apply 2 g topically 4 (four) times daily.    Dispense:  2 g    Refill:  6  . olmesartan-hydrochlorothiazide (BENICAR HCT) 20-12.5 MG tablet    Sig: Take 1/2 tablet by mouth daily.    Dispense:  45 tablet    Refill:  2  . omeprazole (PRILOSEC) 40 MG capsule    Sig: Take 1 capsule (40 mg total) by mouth daily.    Dispense:  90 capsule    Refill:  2  . valsartan (DIOVAN) 40 MG tablet    Sig: Take 1 tablet (40 mg total) by mouth daily.    Dispense:  90 tablet    Refill:  [place],  MSN, FNP-C [facility] [address] [place], [place] [address] [phone]

## 2018-01-02 ENCOUNTER — Ambulatory Visit (AMBULATORY_SURGERY_CENTER): Payer: Self-pay | Admitting: *Deleted

## 2018-01-02 ENCOUNTER — Other Ambulatory Visit: Payer: Self-pay

## 2018-01-02 VITALS — Ht 60.0 in | Wt 184.4 lb

## 2018-01-02 DIAGNOSIS — Z1211 Encounter for screening for malignant neoplasm of colon: Secondary | ICD-10-CM

## 2018-01-02 MED ORDER — SUPREP BOWEL PREP KIT 17.5-3.13-1.6 GM/177ML PO SOLN: 1.0000 | Freq: Once | ORAL | 0 refills | Status: AC

## 2018-01-02 NOTE — Progress Notes (Signed)
Video Remote does not have the [place] language.  Patient's Son is present and is fluent in [place].  Patient requests that her son [name] Terrero to translate for the  Pre-Visit appointment.  Patient knows and speaks some [place].  Patient's son or daughter will be here to translate for procedure.  Patient denies any allergies to egg or soy products. Patient denies complications with anesthesia/sedation.  Patient denies oxygen use at home and denies diet medications. Pamphlet given on colonoscopy procedure.

## 2018-01-23 ENCOUNTER — Encounter: Payer: Self-pay | Admitting: Gastroenterology

## 2018-01-23 ENCOUNTER — Ambulatory Visit (AMBULATORY_SURGERY_CENTER): Payer: Medicaid Other | Admitting: Gastroenterology

## 2018-01-23 VITALS — BP 98/52 | HR 63 | Temp 97.5°F | Resp 15 | Ht 63.0 in | Wt 184.0 lb

## 2018-01-23 DIAGNOSIS — Z1211 Encounter for screening for malignant neoplasm of colon: Secondary | ICD-10-CM

## 2018-01-23 DIAGNOSIS — K635 Polyp of colon: Secondary | ICD-10-CM | POA: Diagnosis not present

## 2018-01-23 DIAGNOSIS — D120 Benign neoplasm of cecum: Secondary | ICD-10-CM

## 2018-01-23 MED ORDER — SODIUM CHLORIDE 0.9 % IV SOLN: 500.0000 mL | Freq: Once | INTRAVENOUS | Status: DC

## 2018-01-23 NOTE — Op Note (Signed)
[facility] Patient Name: Alexandra Beasley Procedure Date: 01/23/2018 10:24 AM MRN: [id number] Endoscopist: Mauri Pole , MD Age: 52 Referring MD:  Date of Birth: 1965-12-28 Gender: Female Account #: [id number] Procedure:                Colonoscopy Indications:              Screening for colorectal malignant neoplasm Medicines:                Monitored Anesthesia Care Procedure:                Pre-Anesthesia Assessment:                           - Prior to the procedure, a History and Physical                            was performed, and patient medications and                            allergies were reviewed. The patient's tolerance of                            previous anesthesia was also reviewed. The risks                            and benefits of the procedure and the sedation                            options and risks were discussed with the patient.                            All questions were answered, and informed consent                            was obtained. Prior Anticoagulants: The patient has                            taken no previous anticoagulant or antiplatelet                            agents. ASA Grade Assessment: II - A patient with                            mild systemic disease. After reviewing the risks                            and benefits, the patient was deemed in                            satisfactory condition to undergo the procedure.                           After obtaining informed consent, the colonoscope  was passed under direct vision. Throughout the                            procedure, the patient's blood pressure, pulse, and                            oxygen saturations were monitored continuously. The                            Colonoscope was introduced through the anus and                            advanced to the the cecum, identified by                            appendiceal orifice  and ileocecal valve. The                            colonoscopy was performed without difficulty. The                            patient tolerated the procedure well. The quality                            of the bowel preparation was excellent. The                            ileocecal valve, appendiceal orifice, and rectum                            were photographed. Scope In: 10:35:58 AM Scope Out: 10:51:53 AM Scope Withdrawal Time: 0 hours 12 minutes 0 seconds  Total Procedure Duration: 0 hours 15 minutes 55 seconds  Findings:                 The perianal and digital rectal examinations were                            normal.                           Two sessile polyps were found in the cecum. The                            polyps were 1 to 2 mm in size. These polyps were                            removed with a cold biopsy forceps. Resection and                            retrieval were complete.                           Non-bleeding internal hemorrhoids were found during  retroflexion. The hemorrhoids were small. Complications:            No immediate complications. Estimated Blood Loss:     Estimated blood loss was minimal. Impression:               - Two 1 to 2 mm polyps in the cecum, removed with a                            cold biopsy forceps. Resected and retrieved.                           - Non-bleeding internal hemorrhoids. Recommendation:           - Patient has a contact number available for                            emergencies. The signs and symptoms of potential                            delayed complications were discussed with the                            patient. Return to normal activities tomorrow.                            Written discharge instructions were provided to the                            patient.                           - Resume previous diet.                           - Continue present medications.                            - Await pathology results.                           - Repeat colonoscopy in 5-10 years for surveillance                            based on pathology results. Mauri Pole, MD 01/23/2018 10:59:54 AM This report has been signed electronically.

## 2018-01-23 NOTE — Progress Notes (Signed)
Pt's daughter, Rechelle Niebla, is interpreting for pt.  Release of responsibility for interpretation form signed  Pt's states no medical or surgical changes since previsit or office visit.

## 2018-01-23 NOTE — Progress Notes (Signed)
To PACU, VSS. Report to [email or web address] 

## 2018-01-23 NOTE — Patient Instructions (Signed)
YOU HAD AN ENDOSCOPIC PROCEDURE TODAY AT THE [facility]:   Refer to the procedure report that was given to you for any specific questions about what was found during the examination.  If the procedure report does not answer your questions, please call your gastroenterologist to clarify.  If you requested that your care partner not be given the details of your procedure findings, then the procedure report has been included in a sealed envelope for you to review at your convenience later.  YOU SHOULD EXPECT: Some feelings of bloating in the abdomen. Passage of more gas than usual.  Walking can help get rid of the air that was put into your GI tract during the procedure and reduce the bloating. If you had a lower endoscopy (such as a colonoscopy or flexible sigmoidoscopy) you may notice spotting of blood in your stool or on the toilet paper. If you underwent a bowel prep for your procedure, you may not have a normal bowel movement for a few days.  Please Note:  You might notice some irritation and congestion in your nose or some drainage.  This is from the oxygen used during your procedure.  There is no need for concern and it should clear up in a day or so.  SYMPTOMS TO REPORT IMMEDIATELY:   Following lower endoscopy (colonoscopy or flexible sigmoidoscopy):  Excessive amounts of blood in the stool  Significant tenderness or worsening of abdominal pains  Swelling of the abdomen that is new, acute  Fever of 100F or higher   For urgent or emergent issues, a gastroenterologist can be reached at any hour by calling [phone].   DIET:  We do recommend a small meal at first, but then you may proceed to your regular diet.  Drink plenty of fluids but you should avoid alcoholic beverages for 24 hours.  ACTIVITY:  You should plan to take it easy for the rest of today and you should NOT DRIVE or use heavy machinery until tomorrow (because of the sedation medicines used during the test).     FOLLOW UP: Our staff will call the number listed on your records the next business day following your procedure to check on you and address any questions or concerns that you may have regarding the information given to you following your procedure. If we do not reach you, we will leave a message.  However, if you are feeling well and you are not experiencing any problems, there is no need to return our call.  We will assume that you have returned to your regular daily activities without incident.  If any biopsies were taken you will be contacted by phone or by letter within the next 1-3 weeks.  Please call us at [phone] if you have not heard about the biopsies in 3 weeks.    SIGNATURES/CONFIDENTIALITY: You and/or your care partner have signed paperwork which will be entered into your electronic medical record.  These signatures attest to the fact that that the information above on your After Visit Summary has been reviewed and is understood.  Full responsibility of the confidentiality of this discharge information lies with you and/or your care-partner.  Read all of the handouts given to you by your recovery room nurse. 

## 2018-01-23 NOTE — Progress Notes (Signed)
Called to room to assist during endoscopic procedure.  Patient ID and intended procedure confirmed with present staff. Received instructions for my participation in the procedure from the performing physician.  

## 2018-01-24 ENCOUNTER — Telehealth: Payer: Self-pay | Admitting: *Deleted

## 2018-01-24 NOTE — Telephone Encounter (Signed)
  Follow up Call-  Call back number 01/23/2018  Post procedure Call Back phone  # [phone]  Permission to leave phone message Yes  Some recent data might be hidden     Patient questions:  Do you have a fever, pain , or abdominal swelling? No. Pain Score  0 *  Have you tolerated food without any problems? Yes.    Have you been able to return to your normal activities? Yes.    Do you have any questions about your discharge instructions: Diet   No. Medications  No. Follow up visit  No.  Do you have questions or concerns about your Care? No.  Actions: * If pain score is 4 or above: No action needed, pain <4.

## 2018-01-30 ENCOUNTER — Encounter: Payer: Self-pay | Admitting: Gastroenterology

## 2018-02-05 ENCOUNTER — Ambulatory Visit: Payer: Self-pay | Admitting: Family Medicine

## 2018-02-12 ENCOUNTER — Ambulatory Visit: Payer: Medicaid Other | Admitting: Podiatry

## 2018-02-12 ENCOUNTER — Encounter: Payer: Self-pay | Admitting: Podiatry

## 2018-02-12 ENCOUNTER — Ambulatory Visit (INDEPENDENT_AMBULATORY_CARE_PROVIDER_SITE_OTHER): Payer: Medicaid Other

## 2018-02-12 ENCOUNTER — Other Ambulatory Visit: Payer: Self-pay | Admitting: Podiatry

## 2018-02-12 VITALS — BP 113/71 | HR 72

## 2018-02-12 DIAGNOSIS — M7751 Other enthesopathy of right foot: Secondary | ICD-10-CM

## 2018-02-12 DIAGNOSIS — M7752 Other enthesopathy of left foot: Principal | ICD-10-CM

## 2018-02-12 DIAGNOSIS — M79672 Pain in left foot: Secondary | ICD-10-CM

## 2018-02-12 DIAGNOSIS — M79671 Pain in right foot: Secondary | ICD-10-CM | POA: Diagnosis not present

## 2018-02-12 MED ORDER — DICLOFENAC SODIUM 1 % TD GEL: 2.0000 g | Freq: Three times a day (TID) | TRANSDERMAL | 6 refills | Status: DC

## 2018-02-12 MED ORDER — MELOXICAM 15 MG PO TABS: 15.0000 mg | ORAL_TABLET | Freq: Every day | ORAL | 1 refills | Status: AC

## 2018-02-13 NOTE — Progress Notes (Signed)
   HPI: 52 year old female presenting today as a new patient with a chief complaint of intermittent sharp pain of the bilateral feet that has been present for the past few years. She states the pain is getting progressively worse and is greatest after trying to walk after sitting for long periods of time. She has not done anything for treatment. Patient is here for further evaluation and treatment.   Past Medical History:  Diagnosis Date  . Abnormal mammogram of left breast 08/31/2016  . Arthritis    knee  . Atypical ductal hyperplasia of left breast   . Breast mass, left   . GERD (gastroesophageal reflux disease)   . Glaucoma   . Hyperlipidemia   . Hypertension   . SVD (spontaneous vaginal delivery)    x 2     Physical Exam: General: The patient is alert and oriented x3 in no acute distress.  Dermatology: Skin is warm, dry and supple bilateral lower extremities. Negative for open lesions or macerations.  Vascular: Palpable pedal pulses bilaterally. No edema or erythema noted. Capillary refill within normal limits.  Neurological: Epicritic and protective threshold grossly intact bilaterally.   Musculoskeletal Exam: Pain with palpation noted to the 2nd MPJ of bilateral feet. Range of motion within normal limits to all pedal and ankle joints bilateral. Muscle strength 5/5 in all groups bilateral.   Radiographic Exam:  Normal osseous mineralization. Joint spaces preserved. No fracture/dislocation/boney destruction.    Assessment: 1. 2nd MPJ capsulitis bilateral    Plan of Care:  1. Patient evaluated. X-Rays reviewed.  2. Injection of 0.5 mLs Celestone Soluspan injected into the 2nd MPJ of bilateral feet.  3. Prescription for Meloxicam provided to patient. 4. Refill prescription for Voltaren Gel 1% TID provided to patient.  5. Recommended good shoe gear.  6. Return to clinic as needed.       Edrick Kins, DPM [facility]  Dr. Edrick Kins, DPM    [address], [place] [address]                Office [phone]  Fax [phone]

## 2018-02-27 ENCOUNTER — Ambulatory Visit: Payer: Medicaid Other | Admitting: Gastroenterology

## 2018-02-27 ENCOUNTER — Encounter: Payer: Self-pay | Admitting: Gastroenterology

## 2018-02-27 VITALS — BP 98/66 | HR 73 | Ht 63.0 in | Wt 184.0 lb

## 2018-02-27 DIAGNOSIS — K6289 Other specified diseases of anus and rectum: Secondary | ICD-10-CM | POA: Diagnosis not present

## 2018-02-27 DIAGNOSIS — K625 Hemorrhage of anus and rectum: Secondary | ICD-10-CM | POA: Diagnosis not present

## 2018-02-27 DIAGNOSIS — K602 Anal fissure, unspecified: Secondary | ICD-10-CM

## 2018-02-27 DIAGNOSIS — K5900 Constipation, unspecified: Secondary | ICD-10-CM

## 2018-02-27 MED ORDER — AMBULATORY NON FORMULARY MEDICATION: 0 refills | Status: DC

## 2018-02-27 NOTE — Patient Instructions (Signed)
We have sent a prescription for nitroglycerin 0.125% gel to [facility]. You should apply a pea size amount to your rectum three times daily x 6-8 weeks.  [facility]'s information is below: Address: [address], [place], [place] [address]  [phone]    *Please DO NOT go directly from our office to pick up this medication! Give the pharmacy 1 day to process the prescription as this is compounded at takes time to make.   Anal Fissure, Adult An anal fissure is a small tear or crack in the skin around the opening of the butt (anus).Bleeding from the tear or crack usually stops on its own within a few minutes. The bleeding may happen every time you poop (have a bowel movement) until the tear or crack heals. Follow these instructions at home: Eating and drinking  Avoid bananas and dairy products. These foods can make it hard to poop.  Drink enough fluid to keep your pee (urine) clear or pale yellow.  Eat a lot of fruit, whole grains, and vegetables. General instructions  Keep the butt area as clean and dry as you can.  Take a warm water bath (sitz bath) as told by your doctor. Do not use soap.  Take over-the-counter and prescription medicines only as told by your doctor.  Use creams or ointments only as told by your doctor.  Keep all follow-up visits as told by your doctor. This is important. Contact a doctor if:  You have more bleeding.  You have a fever.  You have watery poop (diarrhea) that is mixed with blood.  You have pain.  You problem gets worse, not better. This information is not intended to replace advice given to you by your health care provider. Make sure you discuss any questions you have with your health care provider. Document Released: 11/03/2010 Document Revised: 08/13/2015 Document Reviewed: 06/02/2014 Elsevier Interactive Patient Education  [employer].

## 2018-02-27 NOTE — Progress Notes (Addendum)
Alexandra Beasley    [id number]    1965/10/26  Primary Care Physician:Stroud, Ellie Lunch, FNP  Referring Physician: Azzie Glatter, [address], [place] [address]  Chief complaint:  Constipation, rectal discomfort and blood per rectum  HPI: 52 year old female here for follow-up visit with complaints of intermittent rectal pain associated with bright red blood per rectum.  She has occasional hard stool and when she strains she has rectal pain and has blood when she wipes, ongoing for past few months.  She has an episode once every 1 to 2 weeks.  She had an episode of bleeding last week. She took Benefiber for 4 to 5 days and discontinued after as she did not think it was helping.  She had taken MiraLAX in the past with no improvement.  She has soft bowel movement every day on most days  Colonoscopy January 23, 2018: Internal hemorrhoids otherwise normal exam   Outpatient Encounter Medications as of 02/27/2018  Medication Sig  . amLODipine (NORVASC) 5 MG tablet Take 1 tablet (5 mg total) by mouth daily.  Marland Kitchen atorvastatin (LIPITOR) 40 MG tablet Take 1 tablet (40 mg total) by mouth daily at 6 PM.  . Calcium Carbonate-Vitamin D (CALTRATE 600+D PO) Take by mouth.  . Cholecalciferol (VITAMIN D3) 1000 units CAPS Take by mouth.  . diclofenac sodium (VOLTAREN) 1 % GEL Apply 2 g topically 3 (three) times daily.  Marland Kitchen latanoprost (XALATAN) 0.005 % ophthalmic solution Place 1 drop into both eyes daily.  . meloxicam (MOBIC) 15 MG tablet Take 1 tablet (15 mg total) by mouth daily.  Marland Kitchen olmesartan-hydrochlorothiazide (BENICAR HCT) 20-12.5 MG tablet Take 1/2 tablet by mouth daily.  Marland Kitchen omeprazole (PRILOSEC) 40 MG capsule Take 1 capsule (40 mg total) by mouth daily.  . tamoxifen (NOLVADEX) 20 MG tablet Take 1 tablet (20 mg total) by mouth daily.  . timolol (BETIMOL) 0.5 % ophthalmic solution 1 drop once.  . [DISCONTINUED] valsartan (DIOVAN) 40 MG tablet Take 1 tablet (40 mg total)  by mouth daily.   No facility-administered encounter medications on file as of 02/27/2018.     Allergies as of 02/27/2018  . (No Known Allergies)    Past Medical History:  Diagnosis Date  . Abnormal mammogram of left breast 08/31/2016  . Arthritis    knee  . Atypical ductal hyperplasia of left breast   . Breast mass, left   . GERD (gastroesophageal reflux disease)   . Glaucoma   . Hyperlipidemia   . Hypertension   . SVD (spontaneous vaginal delivery)    x 2    Past Surgical History:  Procedure Laterality Date  . ABDOMINAL HYSTERECTOMY    . BREAST LUMPECTOMY WITH RADIOACTIVE SEED LOCALIZATION Left 08/31/2016   Procedure: BREAST LUMPECTOMY WITH RADIOACTIVE SEED LOCALIZATION;  Surgeon: Fanny Skates, MD;  Location: [facility];  Service: General;  Laterality: Left;  . CESAREAN SECTION     patient denies this surgery    Family History  Problem Relation Age of Onset  . Vision loss Mother   . Hypertension Mother   . Heart disease Father   . Colon cancer Neg Hx   . Rectal cancer Neg Hx   . Stomach cancer Neg Hx     Social History   Socioeconomic History  . Marital status: Married    Spouse name: Not on file  . Number of children: 2  . Years of education: Not on file  .  Highest education level: Not on file  Occupational History  . Not on file  Social Needs  . Financial resource strain: Not on file  . Food insecurity:    Worry: Not on file    Inability: Not on file  . Transportation needs:    Medical: Not on file    Non-medical: Not on file  Tobacco Use  . Smoking status: Never Smoker  . Smokeless tobacco: Never Used  Substance and Sexual Activity  . Alcohol use: No    Alcohol/week: 0.0 standard drinks  . Drug use: No  . Sexual activity: Not Currently    Partners: Male    Birth control/protection: Post-menopausal    Comment: hysterectomy  Lifestyle  . Physical activity:    Days per week: Not on file    Minutes per session: Not on file    . Stress: Not on file  Relationships  . Social connections:    Talks on phone: Not on file    Gets together: Not on file    Attends religious service: Not on file    Active member of club or organization: Not on file    Attends meetings of clubs or organizations: Not on file    Relationship status: Not on file  . Intimate partner violence:    Fear of current or ex partner: Not on file    Emotionally abused: Not on file    Physically abused: Not on file    Forced sexual activity: Not on file  Other Topics Concern  . Not on file  Social History Narrative  . Not on file      Review of systems: Review of Systems  Constitutional: Negative for fever and chills.  HENT: Negative.   Eyes: Negative for blurred vision.  Respiratory: Negative for cough, shortness of breath and wheezing.   Cardiovascular: Negative for chest pain and palpitations.  Gastrointestinal: as per HPI Genitourinary: Negative for dysuria, urgency, frequency and hematuria.  Musculoskeletal: Positive for myalgias, back pain and joint pain.  Skin: Negative for itching and rash.  Neurological: Negative for dizziness, tremors, focal weakness, seizures and loss of consciousness.  Endo/Heme/Allergies: Negative for seasonal allergies.  Psychiatric/Behavioral: Negative for depression, suicidal ideas and hallucinations.  All other systems reviewed and are negative.   Physical Exam: Vitals:   02/27/18 0910  BP: 98/66  Pulse: 73   Body mass index is 32.59 kg/m. Gen:      No acute distress HEENT:  EOMI, sclera anicteric Neck:     No masses; no thyromegaly Lungs:    Clear to auscultation bilaterally; normal respiratory effort CV:         Regular rate and rhythm; no murmurs Abd:      + bowel sounds; soft, non-tender; no palpable masses, no distension Ext:    No edema; adequate peripheral perfusion Skin:      Warm and dry; no rash Neuro: alert and oriented x 3 Psych: normal mood and affect Rectal exam: Normal  anal sphincter tone, no tenderness + anal fissure anteriorly, no external hemorrhoids Anoscopy: Small internal hemorrhoids, small anterior anal fissure  Data Reviewed:  Reviewed labs, radiology imaging, old records and pertinent past GI work up   Assessment and Plan/Recommendations:  52 year old female with intermittent rectal bleeding associated with rectal tenderness and constipation Small anterior anal fissure on rectal exam Advised patient to increase dietary fiber intake and fluid intake to avoid hard stool Benefiber 1 teaspoon 3 times daily with meals Small pea-sized amount rectal nitroglycerin  0.125% 3 times daily for 6 to 8 weeks  Constipation: Trial of Linzess 72 mcg daily Advised patient to stop it if she develops diarrhea  Due for colorectal cancer screening November 2029  25 minutes was spent face-to-face with the patient. Greater than 50% of the time used for counseling as well as treatment plan and follow-up. She had multiple questions which were answered to her satisfaction  K. Denzil Magnuson , MD [phone]    CC: Azzie Glatter, FNP

## 2018-03-02 ENCOUNTER — Encounter (INDEPENDENT_AMBULATORY_CARE_PROVIDER_SITE_OTHER): Payer: Self-pay | Admitting: Orthopaedic Surgery

## 2018-03-02 ENCOUNTER — Ambulatory Visit (INDEPENDENT_AMBULATORY_CARE_PROVIDER_SITE_OTHER): Payer: Medicaid Other | Admitting: Orthopaedic Surgery

## 2018-03-02 VITALS — BP 99/83 | HR 70 | Ht 63.0 in | Wt 185.0 lb

## 2018-03-02 DIAGNOSIS — G8929 Other chronic pain: Secondary | ICD-10-CM | POA: Diagnosis not present

## 2018-03-02 DIAGNOSIS — M25562 Pain in left knee: Secondary | ICD-10-CM | POA: Diagnosis not present

## 2018-03-02 DIAGNOSIS — M25512 Pain in left shoulder: Secondary | ICD-10-CM

## 2018-03-02 DIAGNOSIS — M25561 Pain in right knee: Secondary | ICD-10-CM | POA: Diagnosis not present

## 2018-03-02 MED ORDER — BUPIVACAINE HCL 0.5 % IJ SOLN
2.0000 mL | INTRAMUSCULAR | Status: AC | PRN
  Administered 2018-03-02: 2 mL via INTRA_ARTICULAR

## 2018-03-02 MED ORDER — LIDOCAINE HCL 1 % IJ SOLN
2.0000 mL | INTRAMUSCULAR | Status: AC | PRN
  Administered 2018-03-02: 2 mL

## 2018-03-02 MED ORDER — METHYLPREDNISOLONE ACETATE 40 MG/ML IJ SUSP
80.0000 mg | INTRAMUSCULAR | Status: AC | PRN
  Administered 2018-03-02: 80 mg

## 2018-03-02 NOTE — Progress Notes (Signed)
Office Visit Note   Patient: Alexandra Beasley           Date of Birth: 1965/08/18           MRN: [id number] Visit Date: 03/02/2018              Requested by: Azzie Glatter, [place], [place] [address] PCP: Azzie Glatter, FNP   Assessment & Plan: Visit Diagnoses:  1. Chronic left shoulder pain   2. Chronic pain of both knees     Plan: New arthritis both knees by prior films in September 2018 slightly more arthritic change on the right than the left.  Has pain her feet but recently was placed on Mobic which seems to make a big difference.  Will try Voltaren gel consider cortisone injection.  Long discussion regarding the arthritis and what she may expect over time.  She would like to have a cortisone injection in her left knee today  Persistent left shoulder pain with difficulty raising her arm over her head.  She has had pain for well over a year.  Had good response to the subacromial cortisone injection last year only to have it read no interval history of injury or trauma.  Will obtain an MRI scan with poor response to time medicines but I suspect she may have a rotator cuff tear  Follow-Up Instructions: Return after MRI left shoulder.   Orders:  No orders of the defined types were placed in this encounter.  No orders of the defined types were placed in this encounter.     Procedures: Large Joint Inj: L knee on 03/02/2018 9:40 AM Indications: pain and diagnostic evaluation Details: 25 G 1.5 in needle, anteromedial approach  Arthrogram: No  Medications: 2 mL bupivacaine 0.5 %; 2 mL lidocaine 1 %; 80 mg methylPREDNISolone acetate 40 MG/ML Procedure, treatment alternatives, risks and benefits explained, specific risks discussed. Consent was given by the patient. Patient was prepped and draped in the usual sterile fashion.       Clinical Data: No additional findings.   Subjective: Chief Complaint  Patient presents with  . Left Shoulder - Pain   . Left Knee - Pain  . Right Knee - Pain  . Shoulder Pain    on going for month, in alot pain after working, using mobic to help with pain that she is having   Alexandra Beasley is 52 years old and accompanied by her daughter who acts as her interpreter and here for follow-up evaluation of left shoulder and bilateral knee pain.  She was seen in September 2018 with evidence of impingement left shoulder.  I injected the subacromial space and her daughter relates she had good relief for "months".  Pain is recurred to the point where she is having difficulty getting dressed, raising her arm over her head and sleeping.  No new injury or trauma.  No numbness or tingling  Also has considerable pain in both of her knees when she is on her feet for long periods of time particularly when she is at work.  She has had some swelling in the posterior aspect of her knee.  Is recently placed on Mobic which has made a big difference. she would like to try the Voltaren gel  HPI  Review of Systems   Objective: Vital Signs: BP 99/83   Pulse 70   Ht 5\' 3"  (1.6 m)   Wt 185 lb (83.9 kg)   LMP  (LMP Unknown)  BMI 32.77 kg/m   Physical Exam Constitutional:      Appearance: She is well-developed.  Eyes:     Pupils: Pupils are equal, round, and reactive to light.  Pulmonary:     Effort: Pulmonary effort is normal.  Skin:    General: Skin is warm and dry.  Neurological:     Mental Status: She is alert and oriented to person, place, and time.  Psychiatric:        Behavior: Behavior normal.     Ortho Exam able to place left arm overhead but with a circuitous motion.  Some pain on the external motion in the impingement position.  Positive empty can.  No pain at the chromic clavicular joint.  No popping or clicking.  Neurovascular exam intact.  Neither knee was hot red or swollen.  No effusion.  I could not palpate a popliteal cyst.  Mild medial joint pain.  Some patellar crepitation.  No instability.  No distal  edema.  Skin intact straight leg raise negative.  Painless range of motion both hips.  +1 pulses  Specialty Comments:  No specialty comments available.  Imaging: No results found.   PMFS History: Patient Active Problem List   Diagnosis Date Noted  . Osteopenia after menopause 12/18/2017  . 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 Abnormal mammogram of left breast 08/31/2016  . Glaucoma 06/04/2015  . Pain in joint, shoulder region 06/04/2015   Past Medical History:  Diagnosis Date  . Abnormal mammogram of left breast 08/31/2016  . Arthritis    knee  . Atypical ductal hyperplasia of left breast   . Breast mass, left   . GERD (gastroesophageal reflux disease)   . Glaucoma   . Hyperlipidemia   . Hypertension   . SVD (spontaneous vaginal delivery)    x 2    Family History  Problem Relation Age of Onset  . Vision loss Mother   . Hypertension Mother   . Heart disease Father   . Colon cancer Neg Hx   . Rectal cancer Neg Hx   . Stomach cancer Neg Hx     Past Surgical History:  Procedure Laterality Date  . ABDOMINAL HYSTERECTOMY    . BREAST LUMPECTOMY WITH RADIOACTIVE SEED LOCALIZATION Left 08/31/2016   Procedure: BREAST LUMPECTOMY WITH RADIOACTIVE SEED LOCALIZATION;  Surgeon: Fanny Skates, MD;  Location: [facility];  Service: General;  Laterality: Left;  . CESAREAN SECTION     patient denies this surgery   Social History   Occupational History  . Not on file  Tobacco Use  . Smoking status: Never Smoker  . Smokeless tobacco: Never Used  Substance and Sexual Activity  . Alcohol use: No    Alcohol/week: 0.0 standard drinks  . Drug use: No  . Sexual activity: Not Currently    Partners: Male    Birth control/protection: Post-menopausal    Comment: hysterectomy     Garald Balding, MD   Note - This record has been created using [employer].  Chart creation errors have been sought, but may not always  have been  located. Such creation errors do not reflect on  the standard of medical care.

## 2018-03-02 NOTE — Progress Notes (Deleted)
   Office Visit Note   Patient: Alexandra Beasley           Date of Birth: [birth year]           MRN: [id number] Visit Date: 03/02/2018              Requested by: Azzie Glatter, [place], [place] [address] PCP: Azzie Glatter, FNP   Assessment & Plan: Visit Diagnoses: No diagnosis found.  Plan: ***  Follow-Up Instructions: No follow-ups on file.   Orders:  No orders of the defined types were placed in this encounter.  No orders of the defined types were placed in this encounter.     Procedures: No procedures performed   Clinical Data: No additional findings.   Subjective: Chief Complaint  Patient presents with  . Left Shoulder - Pain  . Left Knee - Pain  . Right Knee - Pain  . Shoulder Pain    on going for month, in alot pain after working, using mobic to help with pain that she is having     HPI  Review of Systems  Musculoskeletal: Positive for joint swelling and neck pain.  Neurological: Positive for weakness.     Objective: Vital Signs: LMP  (LMP Unknown)   Physical Exam  Ortho Exam  Specialty Comments:  No specialty comments available.  Imaging: No results found.   PMFS History: Patient Active Problem List   Diagnosis Date Noted  . Osteopenia after menopause 12/18/2017  . 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 Abnormal mammogram of left breast 08/31/2016  . Glaucoma 06/04/2015  . Pain in joint, shoulder region 06/04/2015   Past Medical History:  Diagnosis Date  . Abnormal mammogram of left breast 08/31/2016  . Arthritis    knee  . Atypical ductal hyperplasia of left breast   . Breast mass, left   . GERD (gastroesophageal reflux disease)   . Glaucoma   . Hyperlipidemia   . Hypertension   . SVD (spontaneous vaginal delivery)    x 2    Family History  Problem Relation Age of Onset  . Vision loss Mother   . Hypertension Mother   . Heart disease Father   . Colon cancer Neg Hx   .  Rectal cancer Neg Hx   . Stomach cancer Neg Hx     Past Surgical History:  Procedure Laterality Date  . ABDOMINAL HYSTERECTOMY    . BREAST LUMPECTOMY WITH RADIOACTIVE SEED LOCALIZATION Left 08/31/2016   Procedure: BREAST LUMPECTOMY WITH RADIOACTIVE SEED LOCALIZATION;  Surgeon: Fanny Skates, MD;  Location: [facility];  Service: General;  Laterality: Left;  . CESAREAN SECTION     patient denies this surgery   Social History   Occupational History  . Not on file  Tobacco Use  . Smoking status: Never Smoker  . Smokeless tobacco: Never Used  Substance and Sexual Activity  . Alcohol use: No    Alcohol/week: 0.0 standard drinks  . Drug use: No  . Sexual activity: Not Currently    Partners: Male    Birth control/protection: Post-menopausal    Comment: hysterectomy

## 2018-03-05 ENCOUNTER — Encounter (HOSPITAL_COMMUNITY): Payer: Self-pay | Admitting: *Deleted

## 2018-03-05 ENCOUNTER — Other Ambulatory Visit (HOSPITAL_COMMUNITY): Payer: Self-pay | Admitting: *Deleted

## 2018-03-23 ENCOUNTER — Other Ambulatory Visit: Payer: Self-pay

## 2018-04-19 ENCOUNTER — Encounter: Payer: Self-pay | Admitting: Gastroenterology

## 2018-04-19 ENCOUNTER — Ambulatory Visit: Payer: Medicaid Other | Admitting: Gastroenterology

## 2018-04-19 VITALS — BP 110/76 | HR 70 | Ht 63.0 in | Wt 185.4 lb

## 2018-04-19 DIAGNOSIS — K602 Anal fissure, unspecified: Secondary | ICD-10-CM

## 2018-04-19 NOTE — Progress Notes (Signed)
Alexandra Beasley    [id number]    10-16-1965  Primary Care Physician:Stroud, Ellie Lunch, FNP  Referring Physician: Azzie Glatter, [place], [place] [address]  Chief complaint:  Anal fissure  HPI:  53 year old female with history of anal fissure here for follow-up visit.  No longer having discomfort or pain with defecation.  No rectal bleeding.  Overall doing well.  She is using nitroglycerin per rectum once daily at bedtime and taking Benefiber once daily in the evening.  She is having regular soft bowel movement daily.  Denies any abdominal pain nausea, vomiting, melena, dysphagia or odynophagia.  Colonoscopy January 23, 2018: Internal hemorrhoids otherwise normal exam  Outpatient Encounter Medications as of 04/19/2018  Medication Sig  . AMBULATORY NON FORMULARY MEDICATION Medication Name:  Nitroglycerin ointment 0.125% Use pea size amount three times day per rectum  . amLODipine (NORVASC) 5 MG tablet Take 1 tablet (5 mg total) by mouth daily.  Marland Kitchen atorvastatin (LIPITOR) 40 MG tablet Take 1 tablet (40 mg total) by mouth daily at 6 PM.  . Calcium Carbonate-Vitamin D (CALTRATE 600+D PO) Take by mouth.  . Cholecalciferol (VITAMIN D3) 1000 units CAPS Take by mouth.  . diclofenac sodium (VOLTAREN) 1 % GEL Apply 2 g topically 3 (three) times daily.  Marland Kitchen latanoprost (XALATAN) 0.005 % ophthalmic solution Place 1 drop into both eyes daily.  Marland Kitchen olmesartan-hydrochlorothiazide (BENICAR HCT) 20-12.5 MG tablet Take 1/2 tablet by mouth daily.  Marland Kitchen omeprazole (PRILOSEC) 40 MG capsule Take 1 capsule (40 mg total) by mouth daily.  . tamoxifen (NOLVADEX) 20 MG tablet Take 1 tablet (20 mg total) by mouth daily.  . timolol (BETIMOL) 0.5 % ophthalmic solution 1 drop once.  . Wheat Dextrin (BENEFIBER) POWD Take by mouth daily.   No facility-administered encounter medications on file as of 04/19/2018.     Allergies as of 04/19/2018  . (No Known Allergies)    Past Medical  History:  Diagnosis Date  . Abnormal mammogram of left breast 08/31/2016  . Arthritis    knee  . Atypical ductal hyperplasia of left breast   . Breast mass, left   . GERD (gastroesophageal reflux disease)   . Glaucoma   . Hyperlipidemia   . Hypertension   . SVD (spontaneous vaginal delivery)    x 2    Past Surgical History:  Procedure Laterality Date  . ABDOMINAL HYSTERECTOMY    . BREAST LUMPECTOMY WITH RADIOACTIVE SEED LOCALIZATION Left 08/31/2016   Procedure: BREAST LUMPECTOMY WITH RADIOACTIVE SEED LOCALIZATION;  Surgeon: Fanny Skates, MD;  Location: [facility];  Service: General;  Laterality: Left;  . CESAREAN SECTION     patient denies this surgery    Family History  Problem Relation Age of Onset  . Vision loss Mother   . Hypertension Mother   . Heart disease Father   . Colon cancer Neg Hx   . Rectal cancer Neg Hx   . Stomach cancer Neg Hx     Social History   Socioeconomic History  . Marital status: Married    Spouse name: Not on file  . Number of children: 2  . Years of education: Not on file  . Highest education level: Not on file  Occupational History  . Not on file  Social Needs  . Financial resource strain: Not on file  . Food insecurity:    Worry: Not on file    Inability: Not on file  .  Transportation needs:    Medical: Not on file    Non-medical: Not on file  Tobacco Use  . Smoking status: Never Smoker  . Smokeless tobacco: Never Used  Substance and Sexual Activity  . Alcohol use: No    Alcohol/week: 0.0 standard drinks  . Drug use: No  . Sexual activity: Not Currently    Partners: Male    Birth control/protection: Post-menopausal    Comment: hysterectomy  Lifestyle  . Physical activity:    Days per week: Not on file    Minutes per session: Not on file  . Stress: Not on file  Relationships  . Social connections:    Talks on phone: Not on file    Gets together: Not on file    Attends religious service: Not on file     Active member of club or organization: Not on file    Attends meetings of clubs or organizations: Not on file    Relationship status: Not on file  . Intimate partner violence:    Fear of current or ex partner: Not on file    Emotionally abused: Not on file    Physically abused: Not on file    Forced sexual activity: Not on file  Other Topics Concern  . Not on file  Social History Narrative  . Not on file      Review of systems: Review of Systems  Constitutional: Negative for fever and chills.  HENT: Negative.   Eyes: Negative for blurred vision.  Respiratory: Negative for cough, shortness of breath and wheezing.   Cardiovascular: Negative for chest pain and palpitations.  Gastrointestinal: as per HPI Genitourinary: Negative for dysuria, urgency, frequency and hematuria.  Musculoskeletal: Negative for myalgias, back pain and joint pain.  Positive for bilateral leg pain when she walks long distance Skin: Negative for itching and rash.  Neurological: Negative for dizziness, tremors, focal weakness, seizures and loss of consciousness.  Endo/Heme/Allergies: Negative for seasonal allergies.  Psychiatric/Behavioral: Negative for depression, suicidal ideas and hallucinations.  All other systems reviewed and are negative.   Physical Exam: Vitals:   04/19/18 1035  BP: 110/76  Pulse: 70   Body mass index is 32.84 kg/m. Gen:      No acute distress HEENT:  EOMI, sclera anicteric Neck:     No masses; no thyromegaly Lungs:    Clear to auscultation bilaterally; normal respiratory effort CV:         Regular rate and rhythm; no murmurs Abd:      + bowel sounds; soft, non-tender; no palpable masses, no distension Ext:    No edema; adequate peripheral perfusion Skin:      Warm and dry; no rash Neuro: alert and oriented x 3 Psych: normal mood and affect Rectal exam: No external hemorrhoids, normal anal sphincter tone, small anterior anal fissure healing   Data Reviewed:  Reviewed  labs, radiology imaging, old records and pertinent past GI work up   Assessment and Plan/Recommendations:  53 year old female with  anal fissure, healing Continue Benefiber 1 teaspoon 2-3 times daily with meals Continue 0.125% nitroglycerin per rectum 1-2 times daily for additional 4 to 6 weeks to facilitate complete healing of the anal fissure and prevent recurrence Due for screening colonoscopy recall in November 2029 Return as needed    Damaris Hippo , MD [phone]    CC: Azzie Glatter, FNP

## 2018-04-19 NOTE — Patient Instructions (Signed)
  If you are age 55 or younger, your body mass index should be between 19-25. Your Body mass index is 32.84 kg/m. If this is out of the aformentioned range listed, please consider follow up with your Primary Care Provider.   Continue Benefiber once daily.  Continue Nitroglycerin for additional 7-8 weeks longer.  Follow up as needed.

## 2018-04-25 ENCOUNTER — Ambulatory Visit (INDEPENDENT_AMBULATORY_CARE_PROVIDER_SITE_OTHER): Payer: Medicaid Other | Admitting: Family Medicine

## 2018-04-25 ENCOUNTER — Encounter: Payer: Self-pay | Admitting: Family Medicine

## 2018-04-25 VITALS — BP 100/52 | HR 82 | Temp 98.7°F | Ht 63.0 in | Wt 186.0 lb

## 2018-04-25 DIAGNOSIS — M25511 Pain in right shoulder: Secondary | ICD-10-CM | POA: Diagnosis not present

## 2018-04-25 DIAGNOSIS — I10 Essential (primary) hypertension: Secondary | ICD-10-CM | POA: Diagnosis not present

## 2018-04-25 DIAGNOSIS — Z09 Encounter for follow-up examination after completed treatment for conditions other than malignant neoplasm: Secondary | ICD-10-CM | POA: Diagnosis not present

## 2018-04-25 DIAGNOSIS — R42 Dizziness and giddiness: Secondary | ICD-10-CM

## 2018-04-25 DIAGNOSIS — G8929 Other chronic pain: Secondary | ICD-10-CM

## 2018-04-25 DIAGNOSIS — M79671 Pain in right foot: Secondary | ICD-10-CM

## 2018-04-25 DIAGNOSIS — M25512 Pain in left shoulder: Secondary | ICD-10-CM

## 2018-04-25 DIAGNOSIS — M79672 Pain in left foot: Secondary | ICD-10-CM

## 2018-04-25 LAB — POCT URINALYSIS DIP (MANUAL ENTRY)
Bilirubin, UA: NEGATIVE
Blood, UA: NEGATIVE
Glucose, UA: NEGATIVE mg/dL
Ketones, POC UA: NEGATIVE mg/dL
Leukocytes, UA: NEGATIVE
Nitrite, UA: NEGATIVE
Protein Ur, POC: NEGATIVE mg/dL
Spec Grav, UA: 1.025 (ref 1.010–1.025)
Urobilinogen, UA: 0.2 E.U./dL
pH, UA: 5.5 (ref 5.0–8.0)

## 2018-04-25 MED ORDER — DICLOFENAC SODIUM 1 % TD GEL: 2.0000 g | Freq: Three times a day (TID) | TRANSDERMAL | 6 refills | Status: DC

## 2018-04-25 MED ORDER — AMLODIPINE BESYLATE 5 MG PO TABS: 5.0000 mg | ORAL_TABLET | Freq: Every day | ORAL | 3 refills | Status: DC

## 2018-04-25 MED ORDER — MECLIZINE HCL 25 MG PO TABS: 25.0000 mg | ORAL_TABLET | Freq: Three times a day (TID) | ORAL | 2 refills | Status: DC | PRN

## 2018-04-25 NOTE — Progress Notes (Signed)
Patient [facility]  Established Patient Office Visit  Subjective:  Patient ID: Alexandra Beasley, female    DOB: May 21, 1965  Age: 53 y.o. MRN: [id number]  CC:  Chief Complaint  Patient presents with  . Follow-up    Dizzy  . Shoulder Pain    bilateral   HPI Alexandra Beasley is a 53 year old female who presents for follow up.   Past Medical History:  Diagnosis Date  . Abnormal mammogram of left breast 08/31/2016  . Arthritis    knee  . Atypical ductal hyperplasia of left breast   . Breast mass, left   . GERD (gastroesophageal reflux disease)   . Glaucoma   . Hyperlipidemia   . Hypertension   . SVD (spontaneous vaginal delivery)    x 2   Current Status: Since her last office visit, she is doing well with no complaints. He blood pressure readings have been decreased lately. She continues to have moderate dizziness. She denies visual changes, chest pain, cough, shortness of breath, heart palpitations, and falls. She has occasional headaches. Denies severe headaches, confusion, seizures, double vision, and blurred vision, nausea and vomiting. Her pain shoulder pain is moderate today. She is still awaiting insurance approval for MRI. She is accompanied by her daughter today.   She denies fevers, chills, fatigue, recent infections, weight loss, and night sweats. No reports of GI problems such as nausea, vomiting, diarrhea, and constipation. She has no reports of blood in stools, dysuria and hematuria. No depression or anxiety reported.   Past Surgical History:  Procedure Laterality Date  . ABDOMINAL HYSTERECTOMY    . BREAST LUMPECTOMY WITH RADIOACTIVE SEED LOCALIZATION Left 08/31/2016   Procedure: BREAST LUMPECTOMY WITH RADIOACTIVE SEED LOCALIZATION;  Surgeon: Fanny Skates, MD;  Location: [facility];  Service: General;  Laterality: Left;  . CESAREAN SECTION     patient denies this surgery    Family History  Problem Relation  Age of Onset  . Vision loss Mother   . Hypertension Mother   . Heart disease Father   . Colon cancer Neg Hx   . Rectal cancer Neg Hx   . Stomach cancer Neg Hx     Social History   Socioeconomic History  . Marital status: Married    Spouse name: Not on file  . Number of children: 2  . Years of education: Not on file  . Highest education level: Not on file  Occupational History  . Not on file  Social Needs  . Financial resource strain: Not on file  . Food insecurity:    Worry: Not on file    Inability: Not on file  . Transportation needs:    Medical: Not on file    Non-medical: Not on file  Tobacco Use  . Smoking status: Never Smoker  . Smokeless tobacco: Never Used  Substance and Sexual Activity  . Alcohol use: No    Alcohol/week: 0.0 standard drinks  . Drug use: No  . Sexual activity: Not Currently    Partners: Male    Birth control/protection: Post-menopausal    Comment: hysterectomy  Lifestyle  . Physical activity:    Days per week: Not on file    Minutes per session: Not on file  . Stress: Not on file  Relationships  . Social connections:    Talks on phone: Not on file    Gets together: Not on file    Attends religious service: Not on file  Active member of club or organization: Not on file    Attends meetings of clubs or organizations: Not on file    Relationship status: Not on file  . Intimate partner violence:    Fear of current or ex partner: Not on file    Emotionally abused: Not on file    Physically abused: Not on file    Forced sexual activity: Not on file  Other Topics Concern  . Not on file  Social History Narrative  . Not on file    Outpatient Medications Prior to Visit  Medication Sig Dispense Refill  . AMBULATORY NON FORMULARY MEDICATION Medication Name:  Nitroglycerin ointment 0.125% Use pea size amount three times day per rectum 30 g 0  . atorvastatin (LIPITOR) 40 MG tablet Take 1 tablet (40 mg total) by mouth daily at 6 PM. 90  tablet 2  . Calcium Carbonate-Vitamin D (CALTRATE 600+D PO) Take by mouth.    . Cholecalciferol (VITAMIN D3) 1000 units CAPS Take by mouth.    . latanoprost (XALATAN) 0.005 % ophthalmic solution Place 1 drop into both eyes daily.    Marland Kitchen olmesartan-hydrochlorothiazide (BENICAR HCT) 20-12.5 MG tablet Take 1/2 tablet by mouth daily. 45 tablet 2  . omeprazole (PRILOSEC) 40 MG capsule Take 1 capsule (40 mg total) by mouth daily. 90 capsule 2  . tamoxifen (NOLVADEX) 20 MG tablet Take 1 tablet (20 mg total) by mouth daily. 90 tablet 3  . timolol (BETIMOL) 0.5 % ophthalmic solution 1 drop once.    . Wheat Dextrin (BENEFIBER) POWD Take by mouth daily.    Marland Kitchen amLODipine (NORVASC) 5 MG tablet Take 1 tablet (5 mg total) by mouth daily. 90 tablet 2  . diclofenac sodium (VOLTAREN) 1 % GEL Apply 2 g topically 3 (three) times daily. 100 g 6  . meclizine (ANTIVERT) 25 MG tablet Take 25 mg by mouth 3 (three) times daily as needed for dizziness.     No facility-administered medications prior to visit.     No Known Allergies  ROS Review of Systems  Constitutional: Negative.   HENT: Negative.   Eyes: Negative.   Respiratory: Negative.   Cardiovascular: Negative.   Gastrointestinal: Negative.   Endocrine: Negative.   Genitourinary: Negative.   Musculoskeletal: Negative.   Allergic/Immunologic: Negative.   Neurological: Positive for dizziness and headaches.  Hematological: Negative.   Psychiatric/Behavioral: Negative.    Objective:    Physical Exam  Constitutional: She is oriented to person, place, and time. She appears well-developed and well-nourished.  HENT:  Head: Normocephalic and atraumatic.  Eyes: Conjunctivae are normal.  Neck: Neck supple.  Cardiovascular: Normal rate, regular rhythm, normal heart sounds and intact distal pulses.  Pulmonary/Chest: Effort normal and breath sounds normal.  Abdominal: Soft. Bowel sounds are normal.  Musculoskeletal: Normal range of motion.     Comments:  Limited ROM in shoulders: R > L  Neurological: She is alert and oriented to person, place, and time. She has normal reflexes.  Skin: Skin is warm and dry.  Psychiatric: She has a normal mood and affect. Her behavior is normal. Judgment and thought content normal.  Nursing note and vitals reviewed.  BP (!) 100/52 (BP Location: Left Arm, Patient Position: Sitting, Cuff Size: Large)   Pulse 82   Temp 98.7 F (37.1 C) (Oral)   Ht _0  (1.6 m)   Wt 186 lb (84.4 kg)   LMP  (LMP Unknown)   SpO2 98%   BMI 32.95 kg/m  Wt Readings from Last  3 Encounters:  04/25/18 186 lb (84.4 kg)  04/19/18 185 lb 6 oz (84.1 kg)  03/02/18 185 lb (83.9 kg)   Health Maintenance Due  Topic Date Due  . Hepatitis C Screening  04/19/1963  . PAP SMEAR-Modifier  12/18/1984   There are no preventive care reminders to display for this patient.  Lab Results  Component Value Date   TSH 2.00 07/04/2016   Lab Results  Component Value Date   WBC 5.9 06/23/2017   HGB 11.9 06/23/2017   HCT 37.0 06/23/2017   MCV 86 06/23/2017   PLT 254 06/23/2017   Lab Results  Component Value Date   NA 141 06/23/2017   K 4.1 06/23/2017   CHLORIDE 110 (H) 01/24/2017   CO2 21 06/23/2017   GLUCOSE 97 06/23/2017   BUN 7 06/23/2017   CREATININE 0.65 06/23/2017   BILITOT 0.3 06/23/2017   ALKPHOS 80 06/23/2017   AST 39 06/23/2017   ALT 32 06/23/2017   PROT 6.6 06/23/2017   ALBUMIN 3.8 06/23/2017   CALCIUM 9.3 06/23/2017   ANIONGAP 8 06/15/2017   EGFR >60 01/24/2017   Lab Results  Component Value Date   CHOL 167 06/23/2017   Lab Results  Component Value Date   HDL 39 (L) 06/23/2017   Lab Results  Component Value Date   LDLCALC 107 (H) 06/23/2017   Lab Results  Component Value Date   TRIG 105 06/23/2017   Lab Results  Component Value Date   CHOLHDL 4.3 06/23/2017   Lab Results  Component Value Date   HGBA1C 5.6 01/01/2018   Assessment & Plan:   1. Essential hypertension Blood pressure at 100/52  today. She will hold Benicar and re-evaluate blood pressure in 1 month.  She will continue to decrease high sodium intake, excessive alcohol intake, increase potassium intake, smoking cessation, and increase physical activity of at least 30 minutes of cardio activity daily. She will continue to follow Heart Healthy or DASH diet. - amLODipine (NORVASC) 5 MG tablet; Take 1 tablet (5 mg total) by mouth daily.  Dispense: 30 tablet; Refill: 3  2. Chronic pain of both shoulders - diclofenac sodium (VOLTAREN) 1 % GEL; Apply 2 g topically 3 (three) times daily.  Dispense: 100 g; Refill: 6  3. Dizziness Mild. Blood pressures are decreased. we wil hold Benicar. Continue Norvasc. Continue Meclizine as needed. She will follow up in 1 month for blood pressure re-check. Monitor.   4. Bilateral foot pain She was referred to Podiatry 12/2017. Her pain has subsided.   5. Follow up She will follow up in 6 months.  Lab draw at next office visit.  - POCT urinalysis dipstick  Meds ordered this encounter  Medications  . diclofenac sodium (VOLTAREN) 1 % GEL    Sig: Apply 2 g topically 3 (three) times daily.    Dispense:  100 g    Refill:  6  . amLODipine (NORVASC) 5 MG tablet    Sig: Take 1 tablet (5 mg total) by mouth daily.    Dispense:  30 tablet    Refill:  3  . meclizine (ANTIVERT) 25 MG tablet    Sig: Take 1 tablet (25 mg total) by mouth 3 (three) times daily as needed for dizziness.    Dispense:  90 tablet    Refill:  Handley,  MSN, FNP-C [facility] [place], [place] [address] [phone]   Problem List Items Addressed This  Visit    None    Visit Diagnoses    Essential hypertension    -  Primary   Relevant Medications   amLODipine (NORVASC) 5 MG tablet   Chronic pain of both shoulders       Dizziness       Bilateral foot pain       Relevant Medications   diclofenac sodium (VOLTAREN) 1 % GEL   Follow up       Relevant  Orders   POCT urinalysis dipstick (Completed)      Meds ordered this encounter  Medications  . diclofenac sodium (VOLTAREN) 1 % GEL    Sig: Apply 2 g topically 3 (three) times daily.    Dispense:  100 g    Refill:  6  . amLODipine (NORVASC) 5 MG tablet    Sig: Take 1 tablet (5 mg total) by mouth daily.    Dispense:  30 tablet    Refill:  3  . meclizine (ANTIVERT) 25 MG tablet    Sig: Take 1 tablet (25 mg total) by mouth 3 (three) times daily as needed for dizziness.    Dispense:  90 tablet    Refill:  2    Follow-up: Return in about 6 months (around 10/24/2018).    Azzie Glatter, FNP

## 2018-05-01 ENCOUNTER — Other Ambulatory Visit: Payer: Self-pay

## 2018-05-01 DIAGNOSIS — M79671 Pain in right foot: Secondary | ICD-10-CM

## 2018-05-01 DIAGNOSIS — M79672 Pain in left foot: Principal | ICD-10-CM

## 2018-05-01 MED ORDER — VOLTAREN 1 % TD GEL: 2.0000 g | Freq: Three times a day (TID) | TRANSDERMAL | 6 refills | Status: DC

## 2018-05-01 NOTE — Telephone Encounter (Signed)
Medication changed to brand

## 2018-05-24 ENCOUNTER — Ambulatory Visit: Payer: Medicaid Other | Admitting: Family Medicine

## 2018-05-24 VITALS — BP 132/66 | HR 70 | Ht 63.0 in | Wt 186.0 lb

## 2018-05-24 DIAGNOSIS — Z0130 Encounter for examination of blood pressure without abnormal findings: Secondary | ICD-10-CM

## 2018-05-24 NOTE — Progress Notes (Signed)
Patient advise to continue on medication and keep follow up appointment

## 2018-06-18 ENCOUNTER — Other Ambulatory Visit: Payer: Self-pay | Admitting: Oncology

## 2018-06-18 DIAGNOSIS — Z1231 Encounter for screening mammogram for malignant neoplasm of breast: Secondary | ICD-10-CM

## 2018-08-01 ENCOUNTER — Telehealth: Payer: Self-pay

## 2018-08-02 NOTE — Telephone Encounter (Signed)
Note not needed 

## 2018-08-15 ENCOUNTER — Ambulatory Visit
Admission: RE | Admit: 2018-08-15 | Discharge: 2018-08-15 | Disposition: A | Discharge status: Home / Self Care | Payer: Medicaid Other | Source: Ambulatory Visit | Attending: Oncology | Admitting: Oncology

## 2018-08-15 ENCOUNTER — Other Ambulatory Visit: Payer: Self-pay

## 2018-08-15 DIAGNOSIS — Z1231 Encounter for screening mammogram for malignant neoplasm of breast: Secondary | ICD-10-CM

## 2018-08-16 ENCOUNTER — Other Ambulatory Visit: Payer: Self-pay | Admitting: Oncology

## 2018-08-16 DIAGNOSIS — R928 Other abnormal and inconclusive findings on diagnostic imaging of breast: Secondary | ICD-10-CM

## 2018-08-21 ENCOUNTER — Ambulatory Visit
Admission: RE | Admit: 2018-08-21 | Discharge: 2018-08-21 | Disposition: A | Discharge status: Home / Self Care | Payer: Medicaid Other | Source: Ambulatory Visit | Attending: Oncology | Admitting: Oncology

## 2018-08-21 ENCOUNTER — Other Ambulatory Visit: Payer: Self-pay | Admitting: Oncology

## 2018-08-21 ENCOUNTER — Other Ambulatory Visit: Payer: Self-pay

## 2018-08-21 DIAGNOSIS — R928 Other abnormal and inconclusive findings on diagnostic imaging of breast: Secondary | ICD-10-CM

## 2018-08-21 DIAGNOSIS — N6320 Unspecified lump in the left breast, unspecified quadrant: Secondary | ICD-10-CM

## 2018-08-24 ENCOUNTER — Ambulatory Visit
Admission: RE | Admit: 2018-08-24 | Discharge: 2018-08-24 | Disposition: A | Discharge status: Home / Self Care | Payer: Medicaid Other | Source: Ambulatory Visit | Attending: Oncology | Admitting: Oncology

## 2018-08-24 ENCOUNTER — Other Ambulatory Visit: Payer: Self-pay

## 2018-08-24 DIAGNOSIS — N6320 Unspecified lump in the left breast, unspecified quadrant: Secondary | ICD-10-CM

## 2018-10-24 ENCOUNTER — Ambulatory Visit (INDEPENDENT_AMBULATORY_CARE_PROVIDER_SITE_OTHER): Payer: Medicaid Other | Admitting: Family Medicine

## 2018-10-24 ENCOUNTER — Encounter: Payer: Self-pay | Admitting: Family Medicine

## 2018-10-24 ENCOUNTER — Other Ambulatory Visit: Payer: Self-pay

## 2018-10-24 VITALS — BP 128/78 | HR 90 | Temp 98.4°F | Ht 63.0 in | Wt 196.0 lb

## 2018-10-24 DIAGNOSIS — Z131 Encounter for screening for diabetes mellitus: Secondary | ICD-10-CM | POA: Diagnosis not present

## 2018-10-24 DIAGNOSIS — E785 Hyperlipidemia, unspecified: Secondary | ICD-10-CM

## 2018-10-24 DIAGNOSIS — M25511 Pain in right shoulder: Secondary | ICD-10-CM | POA: Diagnosis not present

## 2018-10-24 DIAGNOSIS — M25562 Pain in left knee: Secondary | ICD-10-CM

## 2018-10-24 DIAGNOSIS — M25561 Pain in right knee: Secondary | ICD-10-CM | POA: Diagnosis not present

## 2018-10-24 DIAGNOSIS — K219 Gastro-esophageal reflux disease without esophagitis: Secondary | ICD-10-CM

## 2018-10-24 DIAGNOSIS — M79671 Pain in right foot: Secondary | ICD-10-CM

## 2018-10-24 DIAGNOSIS — M79672 Pain in left foot: Secondary | ICD-10-CM

## 2018-10-24 DIAGNOSIS — G8929 Other chronic pain: Secondary | ICD-10-CM

## 2018-10-24 DIAGNOSIS — M25512 Pain in left shoulder: Secondary | ICD-10-CM

## 2018-10-24 DIAGNOSIS — I10 Essential (primary) hypertension: Secondary | ICD-10-CM | POA: Diagnosis not present

## 2018-10-24 DIAGNOSIS — R42 Dizziness and giddiness: Secondary | ICD-10-CM

## 2018-10-24 DIAGNOSIS — Z09 Encounter for follow-up examination after completed treatment for conditions other than malignant neoplasm: Secondary | ICD-10-CM

## 2018-10-24 LAB — POCT URINALYSIS DIP (MANUAL ENTRY)
Bilirubin, UA: NEGATIVE
Blood, UA: NEGATIVE
Glucose, UA: NEGATIVE mg/dL
Ketones, POC UA: NEGATIVE mg/dL
Leukocytes, UA: NEGATIVE
Nitrite, UA: NEGATIVE
Protein Ur, POC: NEGATIVE mg/dL
Spec Grav, UA: 1.025 (ref 1.010–1.025)
Urobilinogen, UA: 0.2 E.U./dL
pH, UA: 5 (ref 5.0–8.0)

## 2018-10-24 LAB — POCT GLYCOSYLATED HEMOGLOBIN (HGB A1C): Hemoglobin A1C: 6.1 % — AB (ref 4.0–5.6)

## 2018-10-24 MED ORDER — ATORVASTATIN CALCIUM 40 MG PO TABS: 40.0000 mg | ORAL_TABLET | Freq: Every day | ORAL | 1 refills | Status: DC

## 2018-10-24 MED ORDER — VOLTAREN 1 % TD GEL: 2.0000 g | Freq: Three times a day (TID) | TRANSDERMAL | 6 refills | Status: DC

## 2018-10-24 MED ORDER — AMLODIPINE BESYLATE 5 MG PO TABS: 5.0000 mg | ORAL_TABLET | Freq: Every day | ORAL | 1 refills | Status: DC

## 2018-10-24 NOTE — Progress Notes (Signed)
[facility]   Established Patient Office Visit  Subjective:  Patient ID: Alexandra Beasley, female    DOB: 23-Oct-1965  Age: 53 y.o. MRN: [id number]  CC:  Chief Complaint  Patient presents with  . Follow-up    chronic condition     HPI Alexandra Beasley is a 53 year old female who presents for follow up today.   Past Medical History:  Diagnosis Date  . Abnormal mammogram of left breast 08/31/2016  . Arthritis    knee  . Atypical ductal hyperplasia of left breast   . Breast mass, left   . GERD (gastroesophageal reflux disease)   . Glaucoma   . Hyperlipidemia   . Hypertension   . SVD (spontaneous vaginal delivery)    x 2   Current Status: Since her last office visit, she continues to have chronic bilateral shoulder and knee pain, which she takes Acetaminophen and uses Voltaren Gel for minimal relief. She is accompanied by her son today. She denies visual changes, chest pain, cough, shortness of breath, heart palpitations, and falls. She has occasional headaches and dizziness with position changes. Denies severe headaches, confusion, seizures, double vision, and blurred vision, nausea and vomiting.  She denies fevers, chills, fatigue, recent infections, weight loss, and night sweats. She has not had any and falls. No chest pain, heart palpitations, cough and shortness of breath reported. No reports of GI problems such as  diarrhea, and constipation. She has no reports of blood in stools, dysuria and hematuria. No depression or anxiety reported.  Past Surgical History:  Procedure Laterality Date  . ABDOMINAL HYSTERECTOMY    . BREAST EXCISIONAL BIOPSY Left 2018   benign lumpectomy  . BREAST LUMPECTOMY WITH RADIOACTIVE SEED LOCALIZATION Left 08/31/2016   Procedure: BREAST LUMPECTOMY WITH RADIOACTIVE SEED LOCALIZATION;  Surgeon: Fanny Skates, MD;  Location: [facility];  Service: General;  Laterality: Left;  . CESAREAN  SECTION     patient denies this surgery    Family History  Problem Relation Age of Onset  . Vision loss Mother   . Hypertension Mother   . Heart disease Father   . Colon cancer Neg Hx   . Rectal cancer Neg Hx   . Stomach cancer Neg Hx     Social History   Socioeconomic History  . Marital status: Married    Spouse name: Not on file  . Number of children: 2  . Years of education: Not on file  . Highest education level: Not on file  Occupational History  . Not on file  Social Needs  . Financial resource strain: Not on file  . Food insecurity    Worry: Not on file    Inability: Not on file  . Transportation needs    Medical: Not on file    Non-medical: Not on file  Tobacco Use  . Smoking status: Never Smoker  . Smokeless tobacco: Never Used  Substance and Sexual Activity  . Alcohol use: No    Alcohol/week: 0.0 standard drinks  . Drug use: No  . Sexual activity: Not Currently    Partners: Male    Birth control/protection: Post-menopausal    Comment: hysterectomy  Lifestyle  . Physical activity    Days per week: Not on file    Minutes per session: Not on file  . Stress: Not on file  Relationships  . Social Herbalist on phone: Not on file    Gets  together: Not on file    Attends religious service: Not on file    Active member of club or organization: Not on file    Attends meetings of clubs or organizations: Not on file    Relationship status: Not on file  . Intimate partner violence    Fear of current or ex partner: Not on file    Emotionally abused: Not on file    Physically abused: Not on file    Forced sexual activity: Not on file  Other Topics Concern  . Not on file  Social History Narrative  . Not on file    Outpatient Medications Prior to Visit  Medication Sig Dispense Refill  . AMBULATORY NON FORMULARY MEDICATION Medication Name:  Nitroglycerin ointment 0.125% Use pea size amount three times day per rectum 30 g 0  . Calcium  Carbonate-Vitamin D (CALTRATE 600+D PO) Take by mouth.    . Cholecalciferol (VITAMIN D3) 1000 units CAPS Take by mouth.    . latanoprost (XALATAN) 0.005 % ophthalmic solution Place 1 drop into both eyes daily.    . meclizine (ANTIVERT) 25 MG tablet Take 1 tablet (25 mg total) by mouth 3 (three) times daily as needed for dizziness. 90 tablet 2  . omeprazole (PRILOSEC) 40 MG capsule Take 1 capsule (40 mg total) by mouth daily. 90 capsule 2  . tamoxifen (NOLVADEX) 20 MG tablet Take 1 tablet (20 mg total) by mouth daily. 90 tablet 3  . timolol (BETIMOL) 0.5 % ophthalmic solution 1 drop once.    . timolol (TIMOPTIC) 0.5 % ophthalmic solution Place 1 drop into both eyes every morning.    . Wheat Dextrin (BENEFIBER) POWD Take by mouth daily.    Marland Kitchen amLODipine (NORVASC) 5 MG tablet Take 1 tablet (5 mg total) by mouth daily. 30 tablet 3  . atorvastatin (LIPITOR) 40 MG tablet Take 1 tablet (40 mg total) by mouth daily at 6 PM. 90 tablet 2  . olmesartan-hydrochlorothiazide (BENICAR HCT) 20-12.5 MG tablet Take 1/2 tablet by mouth daily. 45 tablet 2  . VOLTAREN 1 % GEL Apply 2 g topically 3 (three) times daily. 100 g 6   No facility-administered medications prior to visit.     No Known Allergies  ROS Review of Systems  Constitutional: Negative.   HENT: Negative.   Eyes: Negative.   Respiratory: Negative.   Cardiovascular: Negative.   Gastrointestinal: Negative.   Endocrine: Negative.   Genitourinary: Negative.   Musculoskeletal: Positive for arthralgias (chronic bilateral shoulder pain R<L; chronic bilateral knee pain R>L).  Skin: Negative.   Allergic/Immunologic: Negative.   Neurological: Positive for dizziness (occasioanal ) and headaches (occasional ).  Hematological: Negative.   Psychiatric/Behavioral: Negative.       Objective:    Physical Exam  Constitutional: She is oriented to person, place, and time. She appears well-developed and well-nourished.  HENT:  Head: Normocephalic and  atraumatic.  Neck: Normal range of motion. Neck supple.  Cardiovascular: Normal rate, regular rhythm, normal heart sounds and intact distal pulses.  Pulmonary/Chest: Effort normal and breath sounds normal.  Abdominal: Soft. Bowel sounds are normal.  Musculoskeletal: Normal range of motion.  Neurological: She is alert and oriented to person, place, and time. She has normal reflexes.  Skin: Skin is warm and dry.  Psychiatric: She has a normal mood and affect. Her behavior is normal. Judgment and thought content normal.  Nursing note and vitals reviewed.   BP 128/78 (BP Location: Left Arm, Patient Position: Sitting, Cuff Size: Large)  Pulse 90   Temp 98.4 F (36.9 C) (Oral)   Ht _0  (1.6 m)   Wt 196 lb (88.9 kg)   LMP  (LMP Unknown)   SpO2 96%   BMI 34.72 kg/m  Wt Readings from Last 3 Encounters:  10/24/18 196 lb (88.9 kg)  05/24/18 186 lb (84.4 kg)  04/25/18 186 lb (84.4 kg)     Health Maintenance Due  Topic Date Due  . Hepatitis C Screening  06-09-1965  . PAP SMEAR-Modifier  12/18/1984  . INFLUENZA VACCINE  10/20/2018    There are no preventive care reminders to display for this patient.  Lab Results  Component Value Date   TSH 2.00 07/04/2016   Lab Results  Component Value Date   WBC 5.9 06/23/2017   HGB 11.9 06/23/2017   HCT 37.0 06/23/2017   MCV 86 06/23/2017   PLT 254 06/23/2017   Lab Results  Component Value Date   NA 141 06/23/2017   K 4.1 06/23/2017   CHLORIDE 110 (H) 01/24/2017   CO2 21 06/23/2017   GLUCOSE 97 06/23/2017   BUN 7 06/23/2017   CREATININE 0.65 06/23/2017   BILITOT 0.3 06/23/2017   ALKPHOS 80 06/23/2017   AST 39 06/23/2017   ALT 32 06/23/2017   PROT 6.6 06/23/2017   ALBUMIN 3.8 06/23/2017   CALCIUM 9.3 06/23/2017   ANIONGAP 8 06/15/2017   EGFR >60 01/24/2017   Lab Results  Component Value Date   CHOL 167 06/23/2017   Lab Results  Component Value Date   HDL 39 (L) 06/23/2017   Lab Results  Component Value Date    LDLCALC 107 (H) 06/23/2017   Lab Results  Component Value Date   TRIG 105 06/23/2017   Lab Results  Component Value Date   CHOLHDL 4.3 06/23/2017   Lab Results  Component Value Date   HGBA1C 6.1 (A) 10/24/2018      Assessment & Plan:    1. Chronic pain of both shoulders - Ambulatory referral to Physical Therapy  2. Chronic pain of both knees - VOLTAREN 1 % GEL; Apply 2 g topically 3 (three) times daily.  Dispense: 100 g; Refill: 6  3. Bilateral foot pain Patient was referred to Podiatry for treatment. Feet pain has resolved.   4. Essential hypertension The current medical regimen is effective; blood pressure is stable at 128/78 today; continue present plan and medications as prescribed. She will continue to decrease high sodium intake, excessive alcohol intake, increase potassium intake, smoking cessation, and increase physical activity of at least 30 minutes of cardio activity daily. She will continue to follow Heart Healthy or DASH diet. - amLODipine (NORVASC) 5 MG tablet; Take 1 tablet (5 mg total) by mouth daily.  Dispense: 90 tablet; Refill: 1  5. Hyperlipidemia, unspecified hyperlipidemia type - atorvastatin (LIPITOR) 40 MG tablet; Take 1 tablet (40 mg total) by mouth daily at 6 PM.  Dispense: 90 tablet; Refill: 1  6. Dizziness Stable since beginning Meclizine as needed. She is advised to change positions slowly when ambulating.   7. Gastroesophageal reflux disease without esophagitis Continue Omeprazole as prescribed.   8. Screening for diabetes mellitus Hgb A1c is stable at 6.1 today. She will continue to decrease foods/beverages high in sugars and carbs and follow Heart Healthy or DASH diet. Increase physical activity to at least 30 minutes cardio exercise daily.  - POCT glycosylated hemoglobin (Hb A1C) - POCT urinalysis dipstick  9. Follow up She will follow up in 6 months.  Meds ordered this encounter  Medications  . amLODipine (NORVASC) 5 MG tablet     Sig: Take 1 tablet (5 mg total) by mouth daily.    Dispense:  90 tablet    Refill:  1  . atorvastatin (LIPITOR) 40 MG tablet    Sig: Take 1 tablet (40 mg total) by mouth daily at 6 PM.    Dispense:  90 tablet    Refill:  1  . VOLTAREN 1 % GEL    Sig: Apply 2 g topically 3 (three) times daily.    Dispense:  100 g    Refill:  6    Orders Placed This Encounter  Procedures  . Ambulatory referral to Physical Therapy  . POCT glycosylated hemoglobin (Hb A1C)  . POCT urinalysis dipstick     Referral Orders     Ambulatory referral to Physical Therapy   Kathe Becton,  MSN, FNP-BC [place], [place] [address] [phone] fax  Problem List Items Addressed This Visit    None    Visit Diagnoses    Chronic pain of both shoulders    -  Primary   Relevant Orders   Ambulatory referral to Physical Therapy   Chronic pain of both knees       Bilateral foot pain       Relevant Medications   VOLTAREN 1 % GEL   Essential hypertension       Relevant Medications   amLODipine (NORVASC) 5 MG tablet   atorvastatin (LIPITOR) 40 MG tablet   Hyperlipidemia, unspecified hyperlipidemia type       Relevant Medications   amLODipine (NORVASC) 5 MG tablet   atorvastatin (LIPITOR) 40 MG tablet   Dizziness       Gastroesophageal reflux disease without esophagitis       Screening for diabetes mellitus       Relevant Orders   POCT glycosylated hemoglobin (Hb A1C) (Completed)   POCT urinalysis dipstick (Completed)   Follow up          Meds ordered this encounter  Medications  . amLODipine (NORVASC) 5 MG tablet    Sig: Take 1 tablet (5 mg total) by mouth daily.    Dispense:  90 tablet    Refill:  1  . atorvastatin (LIPITOR) 40 MG tablet    Sig: Take 1 tablet (40 mg total) by mouth daily at 6 PM.    Dispense:  90 tablet    Refill:  1  . VOLTAREN 1 % GEL    Sig: Apply 2 g topically 3  (three) times daily.    Dispense:  100 g    Refill:  6    Follow-up: Return in about 6 months (around 04/26/2019).    Azzie Glatter, FNP

## 2018-10-24 NOTE — Patient Instructions (Signed)

## 2018-10-30 ENCOUNTER — Telehealth: Payer: Self-pay

## 2018-10-30 DIAGNOSIS — E785 Hyperlipidemia, unspecified: Secondary | ICD-10-CM

## 2018-10-30 DIAGNOSIS — I10 Essential (primary) hypertension: Secondary | ICD-10-CM

## 2018-10-30 DIAGNOSIS — M79671 Pain in right foot: Secondary | ICD-10-CM

## 2018-10-30 DIAGNOSIS — M79672 Pain in left foot: Secondary | ICD-10-CM

## 2018-10-30 MED ORDER — ATORVASTATIN CALCIUM 40 MG PO TABS: 40.0000 mg | ORAL_TABLET | Freq: Every day | ORAL | 1 refills | Status: DC

## 2018-10-30 MED ORDER — VOLTAREN 1 % TD GEL: 2.0000 g | Freq: Three times a day (TID) | TRANSDERMAL | 6 refills | Status: DC

## 2018-10-30 MED ORDER — AMLODIPINE BESYLATE 5 MG PO TABS: 5.0000 mg | ORAL_TABLET | Freq: Every day | ORAL | 1 refills | Status: DC

## 2018-10-30 NOTE — Telephone Encounter (Signed)
Medication sent to [facility] on [address]

## 2018-12-19 ENCOUNTER — Ambulatory Visit: Payer: Self-pay | Admitting: Oncology

## 2018-12-19 NOTE — Progress Notes (Signed)
[place]  [phone] Fax:([phone]) [phone]     ID: Alexandra Beasley DOB: [birth year]  MR#: [id number]  [id number]  Patient Care Team: Azzie Glatter, FNP as PCP - General (Family Medicine) Dreyah Montrose, Virgie Dad, MD as Consulting Physician (Oncology) Fanny Skates, MD as Consulting Physician (General Surgery) Mauri Pole, MD as Consulting Physician (Gastroenterology) OTHER MD:  CHIEF COMPLAINT: Atypical lobular hyperplasia  CURRENT TREATMENT: Tamoxifen   INTERVAL HISTORY: Alexandra Beasley returns today for follow-up of her history of atypical lobular hyperplasia, accompanied by her daughter to translate.   Alexandra Beasley continues on tamoxifen.  She has rare hot flashes.  More commonly she has a vaginal wetness.  Occasionally she uses a pad.  This is not a major issue for her.  Since her last visit, she had routine screening mammography on 08/15/2018 showing a possible abnormality in the left breast. She underwent left diagnostic mammography with tomography and ultrasonography at [facility] on 08/21/2018 showing: breast density category B; indeterminate 4 mm mass in the left breast at 8 o'clock; no axillary adenopathy.  Accordingly on 08/24/2018 she proceeded to biopsy of the left breast area in question. The pathology from this procedure [id number]) showed: breast parenchyma with prominent foreign body-type giant cell reaction to polarizable material (such as tattoo or henna ink)   REVIEW OF SYSTEMS: Alexandra Beasley would love to go back to work for [employer] but they have downsized during the pandemic.  She is doing some housework in her own home and she takes 15 to 20-minute walks every day in the neighborhood.  Her husband who has neck cancer is being treated with surgery at [facility].  At home also is her son who works in Engineer, drilling.  A detailed review of systems today was otherwise benign   HISTORY OF CURRENT ILLNESS: From the original  intake note:  "Alexandra Beasley" [NEE-[id number]] had bilateral screening mammography for 12/08/2016 at [facility]. There was a possible distortion in the left breast, and on 07/21/2016 she'll she underwent left diagnostic mammography with tomography and left breast ultrasonography. The breast density was category B. This confirmed an area of distortion in the lower inner left breast which was not palpable. Ultrasonography found no definite correlate. The axilla was negative.  Biopsy of this area may 07/08/2016 showed ([id number]) a complex sclerosing lesion. Accordingly the patient was referred to surgery and on 08/31/2016 underwent left lumpectomy, which showed atypical lobular hyperplasia ([id number]). She was then referred to the high risk clinic. There has been some delay because of [facility] approval issues  The patient's subsequent history is as detailed below.   PAST MEDICAL HISTORY: Past Medical History:  Diagnosis Date  . Abnormal mammogram of left breast 08/31/2016  . Arthritis    knee  . Atypical ductal hyperplasia of left breast   . Breast mass, left   . GERD (gastroesophageal reflux disease)   . Glaucoma   . Hyperlipidemia   . Hypertension   . SVD (spontaneous vaginal delivery)    x 2    PAST SURGICAL HISTORY: Past Surgical History:  Procedure Laterality Date  . ABDOMINAL HYSTERECTOMY    . BREAST EXCISIONAL BIOPSY Left 2018   benign lumpectomy  . BREAST LUMPECTOMY WITH RADIOACTIVE SEED LOCALIZATION Left 08/31/2016   Procedure: BREAST LUMPECTOMY WITH RADIOACTIVE SEED LOCALIZATION;  Surgeon: Fanny Skates, MD;  Location: [place];  Service: General;  Laterality: Left;  . CESAREAN SECTION     patient denies this surgery  FAMILY HISTORY Family History  Problem Relation Age of Onset  . Vision loss Mother   . Hypertension Mother   . Heart disease Father   . Colon cancer Neg Hx   . Rectal cancer Neg Hx   . Stomach cancer Neg Hx   The patient's father is 74  years old and her mother 63 years old as of August 2018. The patient has one brother and one sister. There is no history of breast or ovarian cancer in the family.   GYNECOLOGIC HISTORY:  No LMP recorded (lmp unknown). Patient has had a hysterectomy. Menarche age 56, first live birth age 76, the patient is [place] P2. She is status post remote hysterectomy, without salpingo-oophorectomy   SOCIAL HISTORY:  She is originally from [place]. She is married, her husband has a history of head and neck cancer and is disabled as a result. He is followed at [facility]. The patient's daughter [name] owns 6 convenience stores. The patient's son [name] is studying pharmacy at [employer]. The patient has one granddaughter. She attends a local [employer]    ADVANCED DIRECTIVES:    HEALTH MAINTENANCE: Social History   Tobacco Use  . Smoking status: Never Smoker  . Smokeless tobacco: Never Used  Substance Use Topics  . Alcohol use: No    Alcohol/week: 0.0 standard drinks  . Drug use: No     Colonoscopy: Overdue  PAP: Status post hysterectomy  Bone density: 07/27/2017 showed a T score of -1.8 osteopenia   No Known Allergies  Current Outpatient Medications  Medication Sig Dispense Refill  . AMBULATORY NON FORMULARY MEDICATION Medication Name:  Nitroglycerin ointment 0.125% Use pea size amount three times day per rectum 30 g 0  . amLODipine (NORVASC) 5 MG tablet Take 1 tablet (5 mg total) by mouth daily. 90 tablet 1  . atorvastatin (LIPITOR) 40 MG tablet Take 1 tablet (40 mg total) by mouth daily at 6 PM. 90 tablet 1  . Calcium Carbonate-Vitamin D (CALTRATE 600+D PO) Take by mouth.    . Cholecalciferol (VITAMIN D3) 1000 units CAPS Take by mouth.    . latanoprost (XALATAN) 0.005 % ophthalmic solution Place 1 drop into both eyes daily.    . meclizine (ANTIVERT) 25 MG tablet Take 1 tablet (25 mg total) by mouth 3 (three) times daily as needed for dizziness. 90 tablet 2  . omeprazole (PRILOSEC) 40 MG capsule  Take 1 capsule (40 mg total) by mouth daily. 90 capsule 2  . tamoxifen (NOLVADEX) 20 MG tablet Take 1 tablet (20 mg total) by mouth daily. 90 tablet 3  . timolol (BETIMOL) 0.5 % ophthalmic solution 1 drop once.    . timolol (TIMOPTIC) 0.5 % ophthalmic solution Place 1 drop into both eyes every morning.    Marland Kitchen VOLTAREN 1 % GEL Apply 2 g topically 3 (three) times daily. 100 g 6  . Wheat Dextrin (BENEFIBER) POWD Take by mouth daily.     No current facility-administered medications for this visit.     OBJECTIVE: Middle-aged [place] woman who appears stated age 23:   12/20/18 1528  BP: 130/74  Pulse: 78  Resp: 18  Temp: 97.8 F (36.6 C)  SpO2: 100%     Body mass index is 34.54 kg/m.   Wt Readings from Last 3 Encounters:  12/20/18 195 lb (88.5 kg)  10/24/18 196 lb (88.9 kg)  05/24/18 186 lb (84.4 kg)      ECOG FS:1 - Symptomatic but completely ambulatory  Sclerae  unicteric, EOMs intact Wearing a mask No cervical or supraclavicular adenopathy Lungs no rales or rhonchi Heart regular rate and rhythm Abd soft, nontender, positive bowel sounds MSK no focal spinal tenderness, no upper extremity lymphedema Neuro: nonfocal, well oriented, appropriate affect Breasts: The right breast is benign.  The left breast is status post lumpectomy.  The cosmetic result is excellent.  There is no evidence of disease activity.  Both axillae are benign.   LAB RESULTS:  CMP     Component Value Date/Time   NA 141 06/23/2017 1006   NA 141 01/24/2017 1018   K 4.1 06/23/2017 1006   K 4.0 01/24/2017 1018   CL 107 (H) 06/23/2017 1006   CO2 21 06/23/2017 1006   CO2 25 01/24/2017 1018   GLUCOSE 97 06/23/2017 1006   GLUCOSE 91 06/15/2017 1139   GLUCOSE 94 01/24/2017 1018   BUN 7 06/23/2017 1006   BUN 9.6 01/24/2017 1018   CREATININE 0.65 06/23/2017 1006   CREATININE 0.7 01/24/2017 1018   CALCIUM 9.3 06/23/2017 1006   CALCIUM 9.7 01/24/2017 1018   PROT 6.6 06/23/2017 1006   PROT 7.5  01/24/2017 1018   ALBUMIN 3.8 06/23/2017 1006   ALBUMIN 3.6 01/24/2017 1018   AST 39 06/23/2017 1006   AST 26 01/24/2017 1018   ALT 32 06/23/2017 1006   ALT 26 01/24/2017 1018   ALKPHOS 80 06/23/2017 1006   ALKPHOS 87 01/24/2017 1018   BILITOT 0.3 06/23/2017 1006   BILITOT 0.40 01/24/2017 1018   GFRNONAA 102 06/23/2017 1006   GFRNONAA >89 07/04/2016 1115   GFRAA 117 06/23/2017 1006   GFRAA >89 07/04/2016 1115    No results found for: TOTALPROTELP, ALBUMINELP, A1GS, A2GS, BETS, BETA2SER, GAMS, MSPIKE, SPEI  No results found for: Nils Pyle, [facility]  Lab Results  Component Value Date   WBC 5.9 06/23/2017   NEUTROABS 4.0 06/23/2017   HGB 11.9 06/23/2017   HCT 37.0 06/23/2017   MCV 86 06/23/2017   PLT 254 06/23/2017      Chemistry      Component Value Date/Time   NA 141 06/23/2017 1006   NA 141 01/24/2017 1018   K 4.1 06/23/2017 1006   K 4.0 01/24/2017 1018   CL 107 (H) 06/23/2017 1006   CO2 21 06/23/2017 1006   CO2 25 01/24/2017 1018   BUN 7 06/23/2017 1006   BUN 9.6 01/24/2017 1018   CREATININE 0.65 06/23/2017 1006   CREATININE 0.7 01/24/2017 1018      Component Value Date/Time   CALCIUM 9.3 06/23/2017 1006   CALCIUM 9.7 01/24/2017 1018   ALKPHOS 80 06/23/2017 1006   ALKPHOS 87 01/24/2017 1018   AST 39 06/23/2017 1006   AST 26 01/24/2017 1018   ALT 32 06/23/2017 1006   ALT 26 01/24/2017 1018   BILITOT 0.3 06/23/2017 1006   BILITOT 0.40 01/24/2017 1018       No results found for: LABCA2  No components found for: [id number]  No results for input(s): INR in the last 168 hours.  No results found for: LABCA2  No results found for: [id number]  No results found for: [id number]  No results found for: [id number]  No results found for: CA2729  No components found for: HGQUANT  No results found for: CEA1 / No results found for: CEA1   No results found for: AFPTUMOR  No results found for: CHROMOGRNA  No results found for: PSA1  No visits  with results within 3 Day(s) from this visit.  Latest known visit with results is:  Office Visit on 10/24/2018  Component Date Value Ref Range Status  . Hemoglobin A1C 10/24/2018 6.1* 4.0 - 5.6 % Final  . Color, UA 10/24/2018 yellow  yellow Final  . Clarity, UA 10/24/2018 clear  clear Final  . Glucose, UA 10/24/2018 negative  negative mg/dL Final  . Bilirubin, UA 10/24/2018 negative  negative Final  . Ketones, POC UA 10/24/2018 negative  negative mg/dL Final  . Spec Grav, UA 10/24/2018 1.025  1.010 - 1.025 Final  . Blood, UA 10/24/2018 negative  negative Final  . pH, UA 10/24/2018 5.0  5.0 - 8.0 Final  . Protein Ur, POC 10/24/2018 negative  negative mg/dL Final  . Urobilinogen, UA 10/24/2018 0.2  0.2 or 1.0 E.U./dL Final  . Nitrite, UA 10/24/2018 Negative  Negative Final  . Leukocytes, UA 10/24/2018 Negative  Negative Final    (this displays the last labs from the last 3 days)  No results found for: TOTALPROTELP, ALBUMINELP, A1GS, A2GS, BETS, BETA2SER, GAMS, MSPIKE, SPEI (this displays SPEP labs)  No results found for: KPAFRELGTCHN, LAMBDASER, KAPLAMBRATIO (kappa/lambda light chains)  No results found for: HGBA, HGBA2QUANT, HGBFQUANT, HGBSQUAN (Hemoglobinopathy evaluation)   No results found for: LDH  No results found for: IRON, TIBC, IRONPCTSAT (Iron and TIBC)  No results found for: FERRITIN  Urinalysis    Component Value Date/Time   LABSPEC >=1.030 05/12/2017 0942   PHURINE 5.5 05/12/2017 0942   GLUCOSEU NEGATIVE 05/12/2017 0942   HGBUR NEGATIVE 05/12/2017 0942   BILIRUBINUR negative 10/24/2018 0915   KETONESUR negative 10/24/2018 0915   KETONESUR NEGATIVE 05/12/2017 0942   PROTEINUR negative 10/24/2018 0915   PROTEINUR NEGATIVE 05/12/2017 0942   UROBILINOGEN 0.2 10/24/2018 0915   UROBILINOGEN 0.2 05/12/2017 0942   NITRITE Negative 10/24/2018 0915   NITRITE NEGATIVE 05/12/2017 0942   LEUKOCYTESUR Negative 10/24/2018 0915     STUDIES: No results found.    ASSESSMENT: 53 y.o. [place] woman status post left breast lumpectomy 08/31/2016 for atypical lobular hyperplasia  (1) anastrozole started 11/07/2016, discontinued February 07, 2017 with poor tolerance  (2) started tamoxifen 03/21/17  (a) status post remote hysterectomy, without salpingo-oophorectomy  (3) Osteopenia: bone density 07/27/2017 showed a T score of -1.8   PLAN: Alexandra Beasley is now close to 2 years into her 5 years of tamoxifen with excellent tolerance.  We could consider dropping the dose if she wished because of the vaginal wetness issue but she does not think this warrants that.  I have encouraged her to not only continue to take daily walks but to extend them to up to 30 minutes at a time.  She will receive a flu shot today.  Otherwise she will return to see me in a year.  She knows to call for any problems that may develop before then.  Shylah Dossantos, Virgie Dad, MD  12/20/18 3:41 PM Medical Oncology and Hematology [facility] [address] [place], [place] [address] Tel. [phone]    Fax. [phone]   I, Wilburn Mylar, am acting as scribe for Dr. Virgie Dad. Teola Felipe.  I, Lurline Del MD, have reviewed the above documentation for accuracy and completeness, and I agree with the above.

## 2018-12-20 ENCOUNTER — Other Ambulatory Visit: Payer: Self-pay

## 2018-12-20 ENCOUNTER — Inpatient Hospital Stay: Payer: Medicaid Other | Attending: Oncology | Admitting: Oncology

## 2018-12-20 VITALS — BP 130/74 | HR 78 | Temp 97.8°F | Resp 18 | Ht 63.0 in | Wt 195.0 lb

## 2018-12-20 DIAGNOSIS — Z79811 Long term (current) use of aromatase inhibitors: Secondary | ICD-10-CM | POA: Insufficient documentation

## 2018-12-20 DIAGNOSIS — N6092 Unspecified benign mammary dysplasia of left breast: Secondary | ICD-10-CM

## 2018-12-20 DIAGNOSIS — K219 Gastro-esophageal reflux disease without esophagitis: Secondary | ICD-10-CM | POA: Insufficient documentation

## 2018-12-20 DIAGNOSIS — M129 Arthropathy, unspecified: Secondary | ICD-10-CM | POA: Diagnosis not present

## 2018-12-20 DIAGNOSIS — Z79899 Other long term (current) drug therapy: Secondary | ICD-10-CM | POA: Insufficient documentation

## 2018-12-20 DIAGNOSIS — Z23 Encounter for immunization: Secondary | ICD-10-CM

## 2018-12-20 MED ORDER — INFLUENZA VAC SPLIT QUAD 0.5 ML IM SUSY
0.5000 mL | PREFILLED_SYRINGE | Freq: Once | INTRAMUSCULAR | Status: AC
  Administered 2018-12-20: 0.5 mL via INTRAMUSCULAR

## 2018-12-20 MED ORDER — INFLUENZA VAC SPLIT QUAD 0.5 ML IM SUSY
PREFILLED_SYRINGE | INTRAMUSCULAR | Status: AC
  Filled 2018-12-20: qty 0.5

## 2018-12-20 MED ORDER — TAMOXIFEN CITRATE 20 MG PO TABS: 20.0000 mg | ORAL_TABLET | Freq: Every day | ORAL | 3 refills | Status: DC

## 2018-12-21 ENCOUNTER — Telehealth: Payer: Self-pay | Admitting: Oncology

## 2018-12-21 NOTE — Telephone Encounter (Signed)
I talk with patient regarding schedule  

## 2019-01-17 ENCOUNTER — Other Ambulatory Visit: Payer: Self-pay | Admitting: Oncology

## 2019-01-17 DIAGNOSIS — N6320 Unspecified lump in the left breast, unspecified quadrant: Secondary | ICD-10-CM

## 2019-02-21 ENCOUNTER — Encounter (HOSPITAL_COMMUNITY): Payer: Self-pay

## 2019-02-27 ENCOUNTER — Other Ambulatory Visit: Payer: Self-pay | Admitting: Oncology

## 2019-02-27 ENCOUNTER — Ambulatory Visit
Admission: RE | Admit: 2019-02-27 | Discharge: 2019-02-27 | Disposition: A | Discharge status: Home / Self Care | Payer: Medicaid Other | Source: Ambulatory Visit | Attending: Oncology | Admitting: Oncology

## 2019-02-27 ENCOUNTER — Ambulatory Visit: Payer: Medicaid Other

## 2019-02-27 ENCOUNTER — Other Ambulatory Visit: Payer: Self-pay

## 2019-02-27 DIAGNOSIS — N6320 Unspecified lump in the left breast, unspecified quadrant: Secondary | ICD-10-CM

## 2019-02-27 DIAGNOSIS — R921 Mammographic calcification found on diagnostic imaging of breast: Secondary | ICD-10-CM

## 2019-04-22 DIAGNOSIS — R238 Other skin changes: Secondary | ICD-10-CM

## 2019-04-22 DIAGNOSIS — R600 Localized edema: Secondary | ICD-10-CM

## 2019-04-22 HISTORY — DX: Other skin changes: R23.8

## 2019-04-22 HISTORY — DX: Localized edema: R60.0

## 2019-04-26 ENCOUNTER — Encounter: Payer: Self-pay | Admitting: Family Medicine

## 2019-04-26 ENCOUNTER — Ambulatory Visit (INDEPENDENT_AMBULATORY_CARE_PROVIDER_SITE_OTHER): Payer: Medicaid Other | Admitting: Family Medicine

## 2019-04-26 ENCOUNTER — Other Ambulatory Visit: Payer: Self-pay

## 2019-04-26 VITALS — BP 135/73 | HR 81 | Temp 98.4°F | Ht 63.0 in | Wt 194.8 lb

## 2019-04-26 DIAGNOSIS — K219 Gastro-esophageal reflux disease without esophagitis: Secondary | ICD-10-CM

## 2019-04-26 DIAGNOSIS — I10 Essential (primary) hypertension: Secondary | ICD-10-CM | POA: Diagnosis not present

## 2019-04-26 DIAGNOSIS — R239 Unspecified skin changes: Secondary | ICD-10-CM

## 2019-04-26 DIAGNOSIS — M25472 Effusion, left ankle: Secondary | ICD-10-CM

## 2019-04-26 DIAGNOSIS — Z131 Encounter for screening for diabetes mellitus: Secondary | ICD-10-CM | POA: Diagnosis not present

## 2019-04-26 DIAGNOSIS — R42 Dizziness and giddiness: Secondary | ICD-10-CM

## 2019-04-26 DIAGNOSIS — R238 Other skin changes: Secondary | ICD-10-CM

## 2019-04-26 DIAGNOSIS — M25474 Effusion, right foot: Secondary | ICD-10-CM

## 2019-04-26 DIAGNOSIS — M25475 Effusion, left foot: Secondary | ICD-10-CM

## 2019-04-26 DIAGNOSIS — Z09 Encounter for follow-up examination after completed treatment for conditions other than malignant neoplasm: Secondary | ICD-10-CM

## 2019-04-26 DIAGNOSIS — M25471 Effusion, right ankle: Secondary | ICD-10-CM

## 2019-04-26 LAB — POCT GLYCOSYLATED HEMOGLOBIN (HGB A1C): Hemoglobin A1C: 5.8 % — AB (ref 4.0–5.6)

## 2019-04-26 LAB — POCT URINALYSIS DIPSTICK
Bilirubin, UA: NEGATIVE
Blood, UA: NEGATIVE
Glucose, UA: NEGATIVE
Ketones, UA: NEGATIVE
Leukocytes, UA: NEGATIVE
Nitrite, UA: NEGATIVE
Protein, UA: NEGATIVE
Spec Grav, UA: >=1.03 — AB (ref 1.010–1.025)
Urobilinogen, UA: 0.2 E.U./dL
pH, UA: 5.5 (ref 5.0–8.0)

## 2019-04-26 LAB — GLUCOSE, POCT (MANUAL RESULT ENTRY): POC Glucose: 122 mg/dl — AB (ref 70–99)

## 2019-04-26 MED ORDER — AMLODIPINE BESYLATE 5 MG PO TABS: 5.0000 mg | ORAL_TABLET | Freq: Every day | ORAL | 2 refills | Status: DC

## 2019-04-26 NOTE — Progress Notes (Signed)
Patient [facility]   Established Patient Office Visit  Subjective:  Patient ID: Alexandra Beasley, female    DOB: 21-Nov-1965  Age: 54 y.o. MRN: [id number]  CC:  Chief Complaint  Patient presents with  . Follow-up    DM  . Edema    Foot & Ankle Swelling  . Referral    Spots on face    HPI Alexandra Beasley is a 54 year old female who presents for Follow Up today.   Past Medical History:  Diagnosis Date  . Abnormal mammogram of left breast 08/31/2016  . Arthritis    knee  . Atypical ductal hyperplasia of left breast   . Bilateral lower extremity edema 04/2019  . Breast mass, left   . GERD (gastroesophageal reflux disease)   . Glaucoma   . Hyperlipidemia   . Hypertension   . SVD (spontaneous vaginal delivery)    x 2    Current Status: Since her last office visit, she has c/o bilateral swelling in feet X several months now. She denies fevers, chills, fatigue, recent infections, weight loss, and night sweats. No reports of GI problems such as diarrhea, and constipation. She has no reports of blood in stools, dysuria and hematuria. She denies pain today. She is doing well with no complaints. She denies visual changes, chest pain, cough, shortness of breath, heart palpitations, and falls. She has occasional headaches and dizziness with position changes. Denies severe headaches, confusion, seizures, double vision, and blurred vision, nausea and vomiting. Her anxiety is stable today. She denies suicidal ideations, homicidal ideations, or auditory hallucinations.  Past Surgical History:  Procedure Laterality Date  . ABDOMINAL HYSTERECTOMY    . BREAST EXCISIONAL BIOPSY Left 2018   benign lumpectomy  . BREAST LUMPECTOMY WITH RADIOACTIVE SEED LOCALIZATION Left 08/31/2016   Procedure: BREAST LUMPECTOMY WITH RADIOACTIVE SEED LOCALIZATION;  Surgeon: Fanny Skates, MD;  Location: [facility];  Service: General;  Laterality: Left;  .  CESAREAN SECTION     patient denies this surgery    Family History  Problem Relation Age of Onset  . Vision loss Mother   . Hypertension Mother   . Heart disease Father   . Colon cancer Neg Hx   . Rectal cancer Neg Hx   . Stomach cancer Neg Hx     Social History   Socioeconomic History  . Marital status: Married    Spouse name: Not on file  . Number of children: 2  . Years of education: Not on file  . Highest education level: Not on file  Occupational History  . Not on file  Tobacco Use  . Smoking status: Never Smoker  . Smokeless tobacco: Never Used  Substance and Sexual Activity  . Alcohol use: No    Alcohol/week: 0.0 standard drinks  . Drug use: No  . Sexual activity: Not Currently    Partners: Male    Birth control/protection: Post-menopausal    Comment: hysterectomy  Other Topics Concern  . Not on file  Social History Narrative  . Not on file   Social Determinants of Health   Financial Resource Strain:   . Difficulty of Paying Living Expenses: Not on file  Food Insecurity:   . Worried About Charity fundraiser in the Last Year: Not on file  . Ran Out of Food in the Last Year: Not on file  Transportation Needs:   . Lack of Transportation (Medical): Not on file  . Lack  of Transportation (Non-Medical): Not on file  Physical Activity:   . Days of Exercise per Week: Not on file  . Minutes of Exercise per Session: Not on file  Stress:   . Feeling of Stress : Not on file  Social Connections:   . Frequency of Communication with Friends and Family: Not on file  . Frequency of Social Gatherings with Friends and Family: Not on file  . Attends Religious Services: Not on file  . Active Member of Clubs or Organizations: Not on file  . Attends Archivist Meetings: Not on file  . Marital Status: Not on file  Intimate Partner Violence:   . Fear of Current or Ex-Partner: Not on file  . Emotionally Abused: Not on file  . Physically Abused: Not on file  .  Sexually Abused: Not on file    Outpatient Medications Prior to Visit  Medication Sig Dispense Refill  . AMBULATORY NON FORMULARY MEDICATION Medication Name:  Nitroglycerin ointment 0.125% Use pea size amount three times day per rectum 30 g 0  . atorvastatin (LIPITOR) 40 MG tablet Take 1 tablet (40 mg total) by mouth daily at 6 PM. 90 tablet 1  . Calcium Carbonate-Vitamin D (CALTRATE 600+D PO) Take by mouth.    . Cholecalciferol (VITAMIN D3) 1000 units CAPS Take by mouth.    . latanoprost (XALATAN) 0.005 % ophthalmic solution Place 1 drop into both eyes daily.    . meclizine (ANTIVERT) 25 MG tablet Take 1 tablet (25 mg total) by mouth 3 (three) times daily as needed for dizziness. 90 tablet 2  . omeprazole (PRILOSEC) 40 MG capsule Take 1 capsule (40 mg total) by mouth daily. 90 capsule 2  . tamoxifen (NOLVADEX) 20 MG tablet Take 1 tablet (20 mg total) by mouth daily. 90 tablet 3  . timolol (BETIMOL) 0.5 % ophthalmic solution 1 drop once.    . timolol (TIMOPTIC) 0.5 % ophthalmic solution Place 1 drop into both eyes every morning.    Marland Kitchen VOLTAREN 1 % GEL Apply 2 g topically 3 (three) times daily. 100 g 6  . Wheat Dextrin (BENEFIBER) POWD Take by mouth daily.    Marland Kitchen amLODipine (NORVASC) 5 MG tablet Take 1 tablet (5 mg total) by mouth daily. 90 tablet 1   No facility-administered medications prior to visit.    No Known Allergies  ROS Review of Systems  Constitutional: Negative.   HENT: Negative.   Eyes: Negative.   Respiratory: Negative.   Cardiovascular: Negative.        Intermittent bilateral ankle and feet swelling.   Gastrointestinal: Negative.   Endocrine: Negative.   Genitourinary: Negative.   Musculoskeletal: Negative.   Skin: Negative.        Facial skin changes  Allergic/Immunologic: Negative.   Neurological: Positive for dizziness (occasional ) and headaches (Occasional ).  Hematological: Negative.   Psychiatric/Behavioral: Negative.    Objective:    Physical Exam   Constitutional: She is oriented to person, place, and time. She appears well-developed and well-nourished.  HENT:  Head: Normocephalic and atraumatic.    Eyes: Conjunctivae are normal.  Cardiovascular: Normal rate, regular rhythm, normal heart sounds and intact distal pulses.  Pulmonary/Chest: Effort normal and breath sounds normal.  Abdominal: Soft. Bowel sounds are normal. She exhibits distension (obese).  Musculoskeletal:        General: Edema: bilateral mild lower extremity.     Cervical back: Normal range of motion and neck supple.  Neurological: She is alert and oriented to person,  place, and time.  Skin: Skin is warm and dry.  Nursing note and vitals reviewed.   BP 135/73   Pulse 81   Temp 98.4 F (36.9 C) (Oral)   Ht '5\' 3"'$  (1.6 m)   Wt 194 lb 12.8 oz (88.4 kg)   LMP  (LMP Unknown)   SpO2 98%   BMI 34.51 kg/m  Wt Readings from Last 3 Encounters:  04/26/19 194 lb 12.8 oz (88.4 kg)  12/20/18 195 lb (88.5 kg)  10/24/18 196 lb (88.9 kg)     Health Maintenance Due  Topic Date Due  . Hepatitis C Screening  03-20-1964  . PAP SMEAR-Modifier  12/18/1984    There are no preventive care reminders to display for this patient.  Lab Results  Component Value Date   TSH 1.420 04/26/2019   Lab Results  Component Value Date   WBC 6.8 04/26/2019   HGB 11.4 04/26/2019   HCT 34.1 04/26/2019   MCV 84 04/26/2019   PLT 226 04/26/2019   Lab Results  Component Value Date   NA 141 04/26/2019   K 3.9 04/26/2019   CHLORIDE 110 (H) 01/24/2017   CO2 19 (L) 04/26/2019   GLUCOSE 110 (H) 04/26/2019   BUN 12 04/26/2019   CREATININE 0.61 04/26/2019   BILITOT 0.3 04/26/2019   ALKPHOS 88 04/26/2019   AST 56 (H) 04/26/2019   ALT 28 04/26/2019   PROT 6.5 04/26/2019   ALBUMIN 3.7 (L) 04/26/2019   CALCIUM 9.4 04/26/2019   ANIONGAP 8 06/15/2017   EGFR >60 01/24/2017   Lab Results  Component Value Date   CHOL 104 04/26/2019   Lab Results  Component Value Date   HDL 32 (L)  04/26/2019   Lab Results  Component Value Date   LDLCALC 52 04/26/2019   Lab Results  Component Value Date   TRIG 107 04/26/2019   Lab Results  Component Value Date   CHOLHDL 3.3 04/26/2019   Lab Results  Component Value Date   HGBA1C 5.8 (A) 04/26/2019      Assessment & Plan:   1. Bilateral swelling of feet and ankles We will initiate Lasix today.  - furosemide (LASIX) 20 MG tablet; Take 1 tablet (20 mg total) by mouth daily. As needed for edema.  Dispense: 30 tablet; Refill: 6  2. Essential hypertension The current medical regimen is effective; blood pressure is stable at 135/73 today; continue present plan and medications as prescribed. She will continue to take medications as prescribed, to decrease high sodium intake, excessive alcohol intake, increase potassium intake, smoking cessation, and increase physical activity of at least 30 minutes of cardio activity daily. She will continue to follow Heart Healthy or DASH diet. - CBC with Differential - Comprehensive metabolic panel - Lipid Panel - TSH - Vitamin B12 - Vitamin D, 25-hydroxy - amLODipine (NORVASC) 5 MG tablet; Take 1 tablet (5 mg total) by mouth daily.  Dispense: 90 tablet; Refill: 2  3. Other skin changes - Ambulatory referral to Dermatology  4. Dizziness Continue Meclizine as needed.   5. Gastroesophageal reflux disease without esophagitis  6. Screening for diabetes mellitus - POCT urinalysis dipstick - POCT glycosylated hemoglobin (Hb A1C) - POCT glucose (manual entry)  7. Follow up She will follow up in 6 months.   Meds ordered this encounter  Medications  . amLODipine (NORVASC) 5 MG tablet    Sig: Take 1 tablet (5 mg total) by mouth daily.    Dispense:  90 tablet    Refill:  2  . furosemide (LASIX) 20 MG tablet    Sig: Take 1 tablet (20 mg total) by mouth daily. As needed for edema.    Dispense:  30 tablet    Refill:  6    Orders Placed This Encounter  Procedures  . CBC with  Differential  . Comprehensive metabolic panel  . Lipid Panel  . TSH  . Vitamin B12  . Vitamin D, 25-hydroxy  . Ambulatory referral to Dermatology  . POCT urinalysis dipstick  . POCT glycosylated hemoglobin (Hb A1C)  . POCT glucose (manual entry)     Referral Orders     Ambulatory referral to Dermatology   Kathe Becton,  MSN, FNP-BC [place],  [address] [phone] fax   Problem List Items Addressed This Visit    None    Visit Diagnoses    Bilateral swelling of feet and ankles    -  Primary   Relevant Medications   furosemide (LASIX) 20 MG tablet   Essential hypertension       Relevant Medications   amLODipine (NORVASC) 5 MG tablet   furosemide (LASIX) 20 MG tablet   Other Relevant Orders   CBC with Differential (Completed)   Comprehensive metabolic panel (Completed)   Lipid Panel (Completed)   TSH (Completed)   Vitamin B12 (Completed)   Vitamin D, 25-hydroxy (Completed)   Other skin changes       Relevant Orders   Ambulatory referral to Dermatology   Dizziness       Gastroesophageal reflux disease without esophagitis       Screening for diabetes mellitus       Relevant Orders   POCT urinalysis dipstick (Completed)   POCT glycosylated hemoglobin (Hb A1C) (Completed)   POCT glucose (manual entry) (Completed)   Follow up          Meds ordered this encounter  Medications  . amLODipine (NORVASC) 5 MG tablet    Sig: Take 1 tablet (5 mg total) by mouth daily.    Dispense:  90 tablet    Refill:  2  . furosemide (LASIX) 20 MG tablet    Sig: Take 1 tablet (20 mg total) by mouth daily. As needed for edema.    Dispense:  30 tablet    Refill:  6    Follow-up: No follow-ups on file.    Azzie Glatter, FNP

## 2019-04-26 NOTE — Patient Instructions (Signed)
Edema  Edema is when you have too much fluid in your body or under your skin. Edema may make your legs, feet, and ankles swell up. Swelling is also common in looser tissues, like around your eyes. This is a common condition. It gets more common as you get older. There are many possible causes of edema. Eating too much salt (sodium) and being on your feet or sitting for a long time can cause edema in your legs, feet, and ankles. Hot weather may make edema worse. Edema is usually painless. Your skin may look swollen or shiny. Follow these instructions at home:  Keep the swollen body part raised (elevated) above the level of your heart when you are sitting or lying down.  Do not sit still or stand for a long time.  Do not wear tight clothes. Do not wear garters on your upper legs.  Exercise your legs. This can help the swelling go down.  Wear elastic bandages or support stockings as told by your doctor.  Eat a low-salt (low-sodium) diet to reduce fluid as told by your doctor.  Depending on the cause of your swelling, you may need to limit how much fluid you drink (fluid restriction).  Take over-the-counter and prescription medicines only as told by your doctor. Contact a doctor if:  Treatment is not working.  You have heart, liver, or kidney disease and have symptoms of edema.  You have sudden and unexplained weight gain. Get help right away if:  You have shortness of breath or chest pain.  You cannot breathe when you lie down.  You have pain, redness, or warmth in the swollen areas.  You have heart, liver, or kidney disease and get edema all of a sudden.  You have a fever and your symptoms get worse all of a sudden. Summary  Edema is when you have too much fluid in your body or under your skin.  Edema may make your legs, feet, and ankles swell up. Swelling is also common in looser tissues, like around your eyes.  Raise (elevate) the swollen body part above the level of your  heart when you are sitting or lying down.  Follow your doctor's instructions about diet and how much fluid you can drink (fluid restriction). This information is not intended to replace advice given to you by your health care provider. Make sure you discuss any questions you have with your health care provider. Document Revised: 03/10/2017 Document Reviewed: 03/25/2016 Elsevier Patient Education  2020 Elsevier Inc.  

## 2019-04-27 ENCOUNTER — Encounter: Payer: Self-pay | Admitting: Family Medicine

## 2019-04-27 DIAGNOSIS — M25471 Effusion, right ankle: Secondary | ICD-10-CM | POA: Insufficient documentation

## 2019-04-27 DIAGNOSIS — R238 Other skin changes: Secondary | ICD-10-CM | POA: Insufficient documentation

## 2019-04-27 DIAGNOSIS — K219 Gastro-esophageal reflux disease without esophagitis: Secondary | ICD-10-CM | POA: Insufficient documentation

## 2019-04-27 DIAGNOSIS — M25475 Effusion, left foot: Secondary | ICD-10-CM | POA: Insufficient documentation

## 2019-04-27 DIAGNOSIS — I10 Essential (primary) hypertension: Secondary | ICD-10-CM | POA: Insufficient documentation

## 2019-04-27 DIAGNOSIS — R42 Dizziness and giddiness: Secondary | ICD-10-CM | POA: Insufficient documentation

## 2019-04-27 DIAGNOSIS — M25472 Effusion, left ankle: Secondary | ICD-10-CM | POA: Insufficient documentation

## 2019-04-27 LAB — VITAMIN D 25 HYDROXY (VIT D DEFICIENCY, FRACTURES): Vit D, 25-Hydroxy: 52.8 ng/mL (ref 30.0–100.0)

## 2019-04-27 LAB — COMPREHENSIVE METABOLIC PANEL
ALT: 28 IU/L (ref 0–32)
AST: 56 IU/L — ABNORMAL HIGH (ref 0–40)
Albumin/Globulin Ratio: 1.3 (ref 1.2–2.2)
Albumin: 3.7 g/dL — ABNORMAL LOW (ref 3.8–4.9)
Alkaline Phosphatase: 88 IU/L (ref 39–117)
BUN/Creatinine Ratio: 20 (ref 9–23)
BUN: 12 mg/dL (ref 6–24)
Bilirubin Total: 0.3 mg/dL (ref 0.0–1.2)
CO2: 19 mmol/L — ABNORMAL LOW (ref 20–29)
Calcium: 9.4 mg/dL (ref 8.7–10.2)
Chloride: 109 mmol/L — ABNORMAL HIGH (ref 96–106)
Creatinine, Ser: 0.61 mg/dL (ref 0.57–1.00)
GFR calc Af Amer: 118 mL/min/{1.73_m2} (ref >59)
GFR calc non Af Amer: 102 mL/min/{1.73_m2} (ref >59)
Globulin, Total: 2.8 g/dL (ref 1.5–4.5)
Glucose: 110 mg/dL — ABNORMAL HIGH (ref 65–99)
Potassium: 3.9 mmol/L (ref 3.5–5.2)
Sodium: 141 mmol/L (ref 134–144)
Total Protein: 6.5 g/dL (ref 6.0–8.5)

## 2019-04-27 LAB — LIPID PANEL
Chol/HDL Ratio: 3.3 ratio (ref 0.0–4.4)
Cholesterol, Total: 104 mg/dL (ref 100–199)
HDL: 32 mg/dL — ABNORMAL LOW (ref >39)
LDL Chol Calc (NIH): 52 mg/dL (ref 0–99)
Triglycerides: 107 mg/dL (ref 0–149)
VLDL Cholesterol Cal: 20 mg/dL (ref 5–40)

## 2019-04-27 LAB — CBC WITH DIFFERENTIAL/PLATELET
Basophils Absolute: 0 10*3/uL (ref 0.0–0.2)
Basos: 1 %
EOS (ABSOLUTE): 0.1 10*3/uL (ref 0.0–0.4)
Eos: 2 %
Hematocrit: 34.1 % (ref 34.0–46.6)
Hemoglobin: 11.4 g/dL (ref 11.1–15.9)
Immature Grans (Abs): 0 10*3/uL (ref 0.0–0.1)
Immature Granulocytes: 0 %
Lymphocytes Absolute: 1.3 10*3/uL (ref 0.7–3.1)
Lymphs: 19 %
MCH: 28.1 pg (ref 26.6–33.0)
MCHC: 33.4 g/dL (ref 31.5–35.7)
MCV: 84 fL (ref 79–97)
Monocytes Absolute: 0.5 10*3/uL (ref 0.1–0.9)
Monocytes: 8 %
Neutrophils Absolute: 4.8 10*3/uL (ref 1.4–7.0)
Neutrophils: 70 %
Platelets: 226 10*3/uL (ref 150–450)
RBC: 4.05 x10E6/uL (ref 3.77–5.28)
RDW: 13.5 % (ref 11.7–15.4)
WBC: 6.8 10*3/uL (ref 3.4–10.8)

## 2019-04-27 LAB — VITAMIN B12: Vitamin B-12: 193 pg/mL — ABNORMAL LOW (ref 232–1245)

## 2019-04-27 LAB — TSH: TSH: 1.42 u[IU]/mL (ref 0.450–4.500)

## 2019-04-27 MED ORDER — FUROSEMIDE 20 MG PO TABS: 20.0000 mg | ORAL_TABLET | Freq: Every day | ORAL | 6 refills | Status: DC

## 2019-04-30 ENCOUNTER — Telehealth: Payer: Self-pay

## 2019-04-30 NOTE — Telephone Encounter (Signed)
Yes. They do have a dermatologist and accept Medicaid. Is it okay to send information to [facility]?

## 2019-04-30 NOTE — Telephone Encounter (Signed)
[facility] does not accept Mrs. [employer].  Per her Daughter she wants the closest location that does accept her insurance. Will it be okay for her to be seen by [facility]?

## 2019-06-19 ENCOUNTER — Telehealth: Payer: Self-pay

## 2019-06-19 ENCOUNTER — Telehealth: Payer: Self-pay | Admitting: Family Medicine

## 2019-06-19 NOTE — Telephone Encounter (Signed)
Will it be okay for Alexandra Beasley to use [facility]  Menthol Gel for leg pain?  It an OTC product.   Please advise. I advised her to check with the pharmacy.  Please advise.

## 2019-06-19 NOTE — Telephone Encounter (Signed)
Son has been informed of Natalie NP reply.

## 2019-06-19 NOTE — Telephone Encounter (Signed)
Done

## 2019-07-17 ENCOUNTER — Telehealth: Payer: Self-pay | Admitting: Family Medicine

## 2019-07-17 ENCOUNTER — Other Ambulatory Visit: Payer: Self-pay

## 2019-07-17 DIAGNOSIS — E785 Hyperlipidemia, unspecified: Secondary | ICD-10-CM

## 2019-07-17 MED ORDER — ATORVASTATIN CALCIUM 40 MG PO TABS: 40.0000 mg | ORAL_TABLET | Freq: Every day | ORAL | 1 refills | Status: DC

## 2019-07-19 NOTE — Telephone Encounter (Signed)
Done

## 2019-09-05 ENCOUNTER — Encounter: Payer: Self-pay | Admitting: Orthopaedic Surgery

## 2019-09-05 ENCOUNTER — Ambulatory Visit (INDEPENDENT_AMBULATORY_CARE_PROVIDER_SITE_OTHER): Payer: Medicaid Other

## 2019-09-05 ENCOUNTER — Ambulatory Visit (INDEPENDENT_AMBULATORY_CARE_PROVIDER_SITE_OTHER): Payer: Medicaid Other | Admitting: Orthopaedic Surgery

## 2019-09-05 ENCOUNTER — Ambulatory Visit: Payer: Self-pay

## 2019-09-05 ENCOUNTER — Other Ambulatory Visit: Payer: Self-pay

## 2019-09-05 VITALS — Ht 63.0 in | Wt 185.0 lb

## 2019-09-05 DIAGNOSIS — M79672 Pain in left foot: Secondary | ICD-10-CM

## 2019-09-05 DIAGNOSIS — M79671 Pain in right foot: Secondary | ICD-10-CM

## 2019-09-05 DIAGNOSIS — M722 Plantar fascial fibromatosis: Secondary | ICD-10-CM | POA: Diagnosis not present

## 2019-09-05 MED ORDER — LIDOCAINE HCL 1 % IJ SOLN
1.0000 mL | INTRAMUSCULAR | Status: AC | PRN
  Administered 2019-09-05: 1 mL

## 2019-09-05 MED ORDER — METHYLPREDNISOLONE ACETATE 40 MG/ML IJ SUSP
40.0000 mg | INTRAMUSCULAR | Status: AC | PRN
  Administered 2019-09-05: 40 mg

## 2019-09-05 NOTE — Progress Notes (Signed)
Office Visit Note   Patient: Alexandra Beasley           Date of Birth: [birth year]           MRN: [id number] Visit Date: 09/05/2019              Requested by: Azzie Glatter, [place],  [place] [address] PCP: Azzie Glatter, FNP   Assessment & Plan: Visit Diagnoses:  1. Bilateral foot pain   2. Plantar fasciitis, bilateral     Plan: 54 year old female accompanied by her daughter for interpretation with bilateral heel pain consistent with plantar fasciitis.  Films demonstrate plantar spurring from both os calci.  After long discussion over 30 minutes will inject the right more symptomatic he will then reevaluate in 3 to 4 weeks and consider injecting the left.  Have also discussed icing and stretching as well as Voltaren gel and good comfortable shoes  Follow-Up Instructions: Return in about 1 month (around 10/05/2019).   Orders:  Orders Placed This Encounter  Procedures  . Foot Inj  . XR Foot 2 Views Left  . XR Foot 2 Views Right   No orders of the defined types were placed in this encounter.     Procedures: Foot Inj  Date/Time: 09/05/2019 4:51 PM Performed by: Garald Balding, MD Authorized by: Garald Balding, MD   Condition: Plantar Fasciitis   Location: right plantar fascia muscle   Approach:  Plantar Medications:  1 mL lidocaine 1 %; 40 mg methylPREDNISolone acetate 40 MG/ML     Clinical Data: No additional findings.   Subjective: Chief Complaint  Patient presents with  . Right Foot - Pain  . Left Foot - Pain  Patient presents today for bilateral foot pain. She has been having pain in the bottom of her heels for two months. Her pain is worse upon getting up after resting. She also states that she has swelling in her lower legs. She said that "the muscles in her leg all go inside". She takes tylenol for pain. No previous treatment.   HPI  Review of Systems  Constitutional: Negative for fatigue.  HENT: Negative for ear pain.    Eyes: Negative for pain.  Respiratory: Negative for shortness of breath.   Cardiovascular: Negative for leg swelling.  Gastrointestinal: Negative for constipation and diarrhea.  Endocrine: Negative for cold intolerance and heat intolerance.  Genitourinary: Negative for difficulty urinating.  Musculoskeletal: Negative for joint swelling.  Skin: Negative for rash.  Allergic/Immunologic: Negative for food allergies.  Neurological: Negative for weakness.  Hematological: Does not bruise/bleed easily.  Psychiatric/Behavioral: Positive for sleep disturbance.     Objective: Vital Signs: Ht 5\' 3"  (1.6 m)   Wt 185 lb (83.9 kg)   LMP  (LMP Unknown)   BMI 32.77 kg/m   Physical Exam Constitutional:      Appearance: She is well-developed.  Eyes:     Pupils: Pupils are equal, round, and reactive to light.  Pulmonary:     Effort: Pulmonary effort is normal.  Skin:    General: Skin is warm and dry.  Neurological:     Mental Status: She is alert and oriented to person, place, and time.  Psychiatric:        Behavior: Behavior normal.     Ortho Exam awake alert and oriented x3.  Comfortable sitting.  Localized tenderness in the midportion of both heels plantar surface consistent with her symptoms of plantar fasciitis.  No Achilles pain.  No posterior heel pain.  No dorsal pain +1 pulses and good capillary refill to toes.  Had some pitting edema both ankles which has been chronic and which has been addressed by her primary care physician  Specialty Comments:  No specialty comments available.  Imaging: XR Foot 2 Views Left  Result Date: 09/05/2019 Films of the left foot were obtained in several projections.  There is a large plantar spur consistent with her symptoms of plantar fasciitis.  No acute changes.  Some mild sagging of the midfoot  XR Foot 2 Views Right  Result Date: 09/05/2019 Films of the right foot were obtained in several projections and similar to the films of the left  foot.  There is a large plantar calcaneal spur.  Consistent with her symptoms of plantar fasciitis.  Some sagging of the midfoot.  No acute changes    PMFS History: Patient Active Problem List   Diagnosis Date Noted  . Plantar fasciitis, bilateral 09/05/2019  . Bilateral swelling of feet and ankles 04/27/2019  . Essential hypertension 04/27/2019  . Other skin changes 04/27/2019  . Dizziness 04/27/2019  . Gastroesophageal reflux disease without esophagitis 04/27/2019  . Osteopenia after menopause 12/18/2017  . 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 Abnormal mammogram of left breast 08/31/2016  . Glaucoma 06/04/2015  . Pain in joint, shoulder region 06/04/2015   Past Medical History:  Diagnosis Date  . Abnormal mammogram of left breast 08/31/2016  . Arthritis    knee  . Atypical ductal hyperplasia of left breast   . Bilateral lower extremity edema 04/2019  . Bilateral lower extremity edema 04/2019  . Breast mass, left   . GERD (gastroesophageal reflux disease)   . Glaucoma   . Hyperlipidemia   . Hypertension   . Other skin changes 04/2019  . SVD (spontaneous vaginal delivery)    x 2    Family History  Problem Relation Age of Onset  . Vision loss Mother   . Hypertension Mother   . Heart disease Father   . Colon cancer Neg Hx   . Rectal cancer Neg Hx   . Stomach cancer Neg Hx     Past Surgical History:  Procedure Laterality Date  . ABDOMINAL HYSTERECTOMY    . BREAST EXCISIONAL BIOPSY Left 2018   benign lumpectomy  . BREAST LUMPECTOMY WITH RADIOACTIVE SEED LOCALIZATION Left 08/31/2016   Procedure: BREAST LUMPECTOMY WITH RADIOACTIVE SEED LOCALIZATION;  Surgeon: Fanny Skates, MD;  Location: [facility];  Service: General;  Laterality: Left;  . CESAREAN SECTION     patient denies this surgery   Social History   Occupational History  . Not on file  Tobacco Use  . Smoking status: Never Smoker  . Smokeless tobacco: Never Used    Vaping Use  . Vaping Use: Never used  Substance and Sexual Activity  . Alcohol use: No    Alcohol/week: 0.0 standard drinks  . Drug use: No  . Sexual activity: Not Currently    Partners: Male    Birth control/protection: Post-menopausal    Comment: hysterectomy

## 2019-09-10 ENCOUNTER — Other Ambulatory Visit: Payer: Self-pay

## 2019-09-10 ENCOUNTER — Ambulatory Visit
Admission: RE | Admit: 2019-09-10 | Discharge: 2019-09-10 | Disposition: A | Discharge status: Home / Self Care | Payer: Medicaid Other | Source: Ambulatory Visit | Attending: Oncology | Admitting: Oncology

## 2019-09-10 DIAGNOSIS — R921 Mammographic calcification found on diagnostic imaging of breast: Secondary | ICD-10-CM

## 2019-09-26 ENCOUNTER — Ambulatory Visit: Payer: Medicaid Other | Admitting: Orthopaedic Surgery

## 2019-10-16 ENCOUNTER — Telehealth: Payer: Self-pay | Admitting: Gastroenterology

## 2019-10-16 NOTE — Telephone Encounter (Signed)
The pt's daughter has called because the pt has BRBPR for the past 2-3 days.  She also has some anal pain with a history of anal fissure.  She states the blood is just a few drops but is with every BM.  Dr Silverio Decamp has an appt opening for 7/30.  She accepted the appt.

## 2019-10-18 ENCOUNTER — Ambulatory Visit (INDEPENDENT_AMBULATORY_CARE_PROVIDER_SITE_OTHER): Payer: Medicaid Other | Admitting: Gastroenterology

## 2019-10-18 ENCOUNTER — Encounter: Payer: Self-pay | Admitting: Gastroenterology

## 2019-10-18 VITALS — BP 130/76 | HR 76 | Ht 63.0 in | Wt 195.0 lb

## 2019-10-18 DIAGNOSIS — E538 Deficiency of other specified B group vitamins: Secondary | ICD-10-CM

## 2019-10-18 DIAGNOSIS — K5900 Constipation, unspecified: Secondary | ICD-10-CM

## 2019-10-18 DIAGNOSIS — K602 Anal fissure, unspecified: Secondary | ICD-10-CM

## 2019-10-18 MED ORDER — AMBULATORY NON FORMULARY MEDICATION: 1 refills | Status: DC

## 2019-10-18 NOTE — Progress Notes (Signed)
Alexandra Beasley    [id number]    10-Jun-1965  Primary Care Physician:[place], Ellie Lunch, FNP  Referring Physician: Azzie Glatter, [place],  [place] [address]   Chief complaint: Rectal bleeding  HPI:  54 year old very pleasant lady here for follow-up visit with complaints of rectal bleeding.  She developed a recurrent episode of rectal discomfort and bleeding.  She has history of anal fissure.  She was having intermittent constipation with hard stool and excessive straining.  She started using nitroglycerin left lower from before and her symptoms have improved in the past week, no longer bleeding and the rectal discomfort also improved.  ColonoscopyNovember 5, 2019:Internal hemorrhoids otherwise normal exam  On review of labs in epic, noted severe B12 deficiency with B12 level 193.  She is currently not taking any vitamin supplements other than vitamin D   Outpatient Encounter Medications as of 10/18/2019  Medication Sig  . AMBULATORY NON FORMULARY MEDICATION Medication Name:  Nitroglycerin ointment 0.125% Use pea size amount three times day per rectum  . amLODipine (NORVASC) 5 MG tablet Take 1 tablet (5 mg total) by mouth daily.  Marland Kitchen atorvastatin (LIPITOR) 40 MG tablet Take 1 tablet (40 mg total) by mouth daily at 6 PM.  . Calcium Carbonate-Vitamin D (CALTRATE 600+D PO) Take by mouth.  . Cholecalciferol (VITAMIN D3) 1000 units CAPS Take by mouth.  . furosemide (LASIX) 20 MG tablet Take 1 tablet (20 mg total) by mouth daily. As needed for edema.  Marland Kitchen latanoprost (XALATAN) 0.005 % ophthalmic solution Place 1 drop into both eyes daily.  . meclizine (ANTIVERT) 25 MG tablet Take 1 tablet (25 mg total) by mouth 3 (three) times daily as needed for dizziness.  Marland Kitchen omeprazole (PRILOSEC) 40 MG capsule Take 1 capsule (40 mg total) by mouth daily.  . tamoxifen (NOLVADEX) 20 MG tablet Take 1 tablet (20 mg total) by mouth daily.  . timolol (BETIMOL) 0.5 %  ophthalmic solution 1 drop once.  . timolol (TIMOPTIC) 0.5 % ophthalmic solution Place 1 drop into both eyes every morning.  Marland Kitchen VOLTAREN 1 % GEL Apply 2 g topically 3 (three) times daily.  . Wheat Dextrin (BENEFIBER) POWD Take by mouth daily.   No facility-administered encounter medications on file as of 10/18/2019.    Allergies as of 10/18/2019  . (No Known Allergies)    Past Medical History:  Diagnosis Date  . Abnormal mammogram of left breast 08/31/2016  . Arthritis    knee  . Atypical ductal hyperplasia of left breast   . Bilateral lower extremity edema 04/2019  . Bilateral lower extremity edema 04/2019  . Breast mass, left   . GERD (gastroesophageal reflux disease)   . Glaucoma   . Hyperlipidemia   . Hypertension   . Other skin changes 04/2019  . SVD (spontaneous vaginal delivery)    x 2    Past Surgical History:  Procedure Laterality Date  . ABDOMINAL HYSTERECTOMY    . BREAST EXCISIONAL BIOPSY Left 2018   benign lumpectomy  . BREAST LUMPECTOMY WITH RADIOACTIVE SEED LOCALIZATION Left 08/31/2016   Procedure: BREAST LUMPECTOMY WITH RADIOACTIVE SEED LOCALIZATION;  Surgeon: Fanny Skates, MD;  Location: [facility];  Service: General;  Laterality: Left;  . CESAREAN SECTION     patient denies this surgery    Family History  Problem Relation Age of Onset  . Vision loss Mother   . Hypertension Mother   . Heart  disease Father   . Colon cancer Neg Hx   . Rectal cancer Neg Hx   . Stomach cancer Neg Hx     Social History   Socioeconomic History  . Marital status: Married    Spouse name: Not on file  . Number of children: 2  . Years of education: Not on file  . Highest education level: Not on file  Occupational History  . Not on file  Tobacco Use  . Smoking status: Never Smoker  . Smokeless tobacco: Never Used  Vaping Use  . Vaping Use: Never used  Substance and Sexual Activity  . Alcohol use: No    Alcohol/week: 0.0 standard drinks  . Drug  use: No  . Sexual activity: Not Currently    Partners: Male    Birth control/protection: Post-menopausal    Comment: hysterectomy  Other Topics Concern  . Not on file  Social History Narrative  . Not on file   Social Determinants of Health   Financial Resource Strain:   . Difficulty of Paying Living Expenses:   Food Insecurity:   . Worried About Charity fundraiser in the Last Year:   . Arboriculturist in the Last Year:   Transportation Needs:   . Film/video editor (Medical):   Marland Kitchen Lack of Transportation (Non-Medical):   Physical Activity:   . Days of Exercise per Week:   . Minutes of Exercise per Session:   Stress:   . Feeling of Stress :   Social Connections:   . Frequency of Communication with Friends and Family:   . Frequency of Social Gatherings with Friends and Family:   . Attends Religious Services:   . Active Member of Clubs or Organizations:   . Attends Archivist Meetings:   Marland Kitchen Marital Status:   Intimate Partner Violence:   . Fear of Current or Ex-Partner:   . Emotionally Abused:   Marland Kitchen Physically Abused:   . Sexually Abused:       Review of systems:  All other review of systems negative except as mentioned in the HPI.   Physical Exam: Vitals:   10/18/19 1443  BP: (!) 130/76  Pulse: 76   Body mass index is 34.54 kg/m. Gen:      No acute distress Neuro: alert and oriented x 3 Psych: normal mood and affect Rectal exam: Normal anal sphincter tone, no external hemorrhoids Anoscopy: Small internal hemorrhoids, left lateral anal fissure, no active bleeding, normal dentate line, no visible nodules   Data Reviewed:  Reviewed labs, radiology imaging, old records and pertinent past GI work up   Assessment and Plan/Recommendations:  54 year old very pleasant lady here for follow-up visit with recurrent anal fissure  Anal fissure: Healing Nitroglycerin 0.125% 3 times daily for 4 to 6 weeks  Constipation: Continue Benefiber 1 tablespoon  2-3 times daily with meals Add MiraLAX 1 capful daily as needed Increase water intake to 8 to 10 cups daily  Severe B12 deficiency: Currently asymptomatic.  Advised her to start sublingual B12 or injections.  She would like to discuss it with PMD, has a follow-up next week.  Due for screening colonoscopy November 2029  Return as needed   The patient was provided an opportunity to ask questions and all were answered. The patient agreed with the plan and demonstrated an understanding of the instructions.  Damaris Hippo , MD    CC: Azzie Glatter, FNP

## 2019-10-18 NOTE — Patient Instructions (Signed)
Take benefiber 1 tablespoon twice daily  Take Miralax 1 capful daily in 8 oz of liquid as needed  We have sent a prescription for nitroglycerin 0.125% gel to [facility]. You should apply a pea size amount to your rectum three times daily x 6-8 weeks.  [facility]'s information is below: Address: [address] [place], [place] [address]  [phone]  *Please DO NOT go directly from our office to pick up this medication! Give the pharmacy 1 day to process the prescription as this is compounded and takes time to make.

## 2019-10-20 DIAGNOSIS — E559 Vitamin D deficiency, unspecified: Secondary | ICD-10-CM

## 2019-10-20 HISTORY — DX: Vitamin D deficiency, unspecified: E55.9

## 2019-10-21 ENCOUNTER — Encounter: Payer: Self-pay | Admitting: Gastroenterology

## 2019-10-25 ENCOUNTER — Ambulatory Visit (INDEPENDENT_AMBULATORY_CARE_PROVIDER_SITE_OTHER): Payer: Medicaid Other | Admitting: Family Medicine

## 2019-10-25 ENCOUNTER — Other Ambulatory Visit: Payer: Self-pay

## 2019-10-25 DIAGNOSIS — Z09 Encounter for follow-up examination after completed treatment for conditions other than malignant neoplasm: Secondary | ICD-10-CM

## 2019-10-25 DIAGNOSIS — M2550 Pain in unspecified joint: Secondary | ICD-10-CM

## 2019-10-25 DIAGNOSIS — M79671 Pain in right foot: Secondary | ICD-10-CM

## 2019-10-25 DIAGNOSIS — Z8719 Personal history of other diseases of the digestive system: Secondary | ICD-10-CM

## 2019-10-25 DIAGNOSIS — E538 Deficiency of other specified B group vitamins: Secondary | ICD-10-CM

## 2019-10-25 DIAGNOSIS — I10 Essential (primary) hypertension: Secondary | ICD-10-CM | POA: Diagnosis not present

## 2019-10-25 DIAGNOSIS — M79672 Pain in left foot: Secondary | ICD-10-CM

## 2019-10-25 MED ORDER — VITAMIN B-12 1000 MCG PO TABS: 1000.0000 ug | ORAL_TABLET | Freq: Every day | ORAL | 3 refills | Status: DC

## 2019-10-25 MED ORDER — DICLOFENAC SODIUM 1 % EX GEL: 4.0000 g | Freq: Four times a day (QID) | CUTANEOUS | 11 refills | Status: DC

## 2019-10-25 NOTE — Patient Instructions (Signed)
Vitamin B12 Deficiency Vitamin B12 deficiency means that your body does not have enough vitamin B12. The body needs this vitamin:  To make red blood cells.  To make genes (DNA).  To help the nerves work. If you do not have enough vitamin B12 in your body, you can have health problems. What are the causes?  Not eating enough foods that contain vitamin B12.  Not being able to absorb vitamin B12 from the food that you eat.  Certain digestive system diseases.  A condition in which the body does not make enough of a certain protein, which results in too few red blood cells (pernicious anemia).  Having a surgery in which part of the stomach or small intestine is removed.  Taking medicines that make it hard for the body to absorb vitamin B12. These medicines include: ? Heartburn medicines. ? Some antibiotic medicines. ? Other medicines that are used to treat certain conditions. What increases the risk?  Being older than age 50.  Eating a vegetarian or vegan diet, especially while you are pregnant.  Eating a poor diet while you are pregnant.  Taking certain medicines.  Having alcoholism. What are the signs or symptoms? In some cases, there are no symptoms. If the condition leads to too few blood cells or nerve damage, symptoms can occur, such as:  Feeling weak.  Feeling tired (fatigued).  Not being hungry.  Weight loss.  A loss of feeling (numbness) or tingling in your hands and feet.  Redness and burning of the tongue.  Being mixed up (confused) or having memory problems.  Sadness (depression).  Problems with your senses. This can include color blindness, ringing in the ears, or loss of taste.  Watery poop (diarrhea) or trouble pooping (constipation).  Trouble walking. If anemia is very bad, symptoms can include:  Being short of breath.  Being dizzy.  Having a very fast heartbeat. How is this treated?  Changing the way you eat and drink, such  as: ? Eating more foods that contain vitamin B12. ? Drinking little or no alcohol.  Getting vitamin B12 shots.  Taking vitamin B12 supplements. Your doctor will tell you the dose that is best for you. Follow these instructions at home: Eating and drinking   Eat lots of healthy foods that contain vitamin B12. These include: ? Meats and poultry, such as beef, pork, chicken, turkey, and organ meats, such as liver. ? Seafood, such as clams, rainbow trout, salmon, tuna, and haddock. ? Eggs. ? Cereal and dairy products that have vitamin B12 added to them. Check the label. The items listed above may not be a complete list of what you can eat and drink. Contact a dietitian for more options. General instructions  Get any shots as told by your doctor.  Take supplements only as told by your doctor.  Do not drink alcohol if your doctor tells you not to. In some cases, you may only be asked to limit alcohol use.  Keep all follow-up visits as told by your doctor. This is important. Contact a doctor if:  Your symptoms come back. Get help right away if:  You have trouble breathing.  You have a very fast heartbeat.  You have chest pain.  You get dizzy.  You pass out. Summary  Vitamin B12 deficiency means that your body is not getting enough vitamin B12.  In some cases, there are no symptoms of this condition.  Treatment may include making a change in the way you eat and drink,   getting vitamin B12 shots, or taking supplements.  Eat lots of healthy foods that contain vitamin B12. This information is not intended to replace advice given to you by your health care provider. Make sure you discuss any questions you have with your health care provider. Document Revised: 11/14/2017 Document Reviewed: 11/14/2017 Elsevier Patient Education  2020 Elsevier Inc.  

## 2019-10-25 NOTE — Progress Notes (Signed)
[facility]   Established Patient Office Visit  Subjective:  Patient ID: Alexandra Beasley, female    DOB: September 29, 1965  Age: 54 y.o. MRN: [id number]  CC: No chief complaint on file.   HPI Alexandra Beasley is a 54 year old female who presents for Follow Up today.   Past Medical History:  Diagnosis Date  . Abnormal mammogram of left breast 08/31/2016  . Arthritis    knee  . Atypical ductal hyperplasia of left breast   . Bilateral lower extremity edema 04/2019  . Bilateral lower extremity edema 04/2019  . Breast mass, left   . GERD (gastroesophageal reflux disease)   . Glaucoma   . Hyperlipidemia   . Hypertension   . Other skin changes 04/2019  . SVD (spontaneous vaginal delivery)    x 2  . Vitamin D deficiency 10/2019   Current Status: Since her last office visit, she has c/o of chronic joint pain. She denies visual changes, chest pain, cough, shortness of breath, heart palpitations, and falls. She has occasional headaches and dizziness with position changes. Denies severe headaches, confusion, seizures, double vision, and blurred vision, nausea and vomiting. She denies fevers, chills, fatigue, recent infections, weight loss, and night sweats.  Denies GI problems such as diarrhea, and constipation. She has no reports of blood in stools, dysuria and hematuria. No depression or anxiety reported today.  She is taking all medications as prescribed. She denies pain today.   Past Surgical History:  Procedure Laterality Date  . ABDOMINAL HYSTERECTOMY    . BREAST EXCISIONAL BIOPSY Left 2018   benign lumpectomy  . BREAST LUMPECTOMY WITH RADIOACTIVE SEED LOCALIZATION Left 08/31/2016   Procedure: BREAST LUMPECTOMY WITH RADIOACTIVE SEED LOCALIZATION;  Surgeon: Fanny Skates, MD;  Location: [facility];  Service: General;  Laterality: Left;  . CESAREAN SECTION     patient denies this surgery    Family History   Problem Relation Age of Onset  . Vision loss Mother   . Hypertension Mother   . Heart disease Father   . Colon cancer Neg Hx   . Rectal cancer Neg Hx   . Stomach cancer Neg Hx     Social History   Socioeconomic History  . Marital status: Married    Spouse name: Not on file  . Number of children: 2  . Years of education: Not on file  . Highest education level: Not on file  Occupational History  . Not on file  Tobacco Use  . Smoking status: Never Smoker  . Smokeless tobacco: Never Used  Vaping Use  . Vaping Use: Never used  Substance and Sexual Activity  . Alcohol use: No    Alcohol/week: 0.0 standard drinks  . Drug use: No  . Sexual activity: Not Currently    Partners: Male    Birth control/protection: Post-menopausal    Comment: hysterectomy  Other Topics Concern  . Not on file  Social History Narrative  . Not on file   Social Determinants of Health   Financial Resource Strain:   . Difficulty of Paying Living Expenses:   Food Insecurity:   . Worried About Charity fundraiser in the Last Year:   . Arboriculturist in the Last Year:   Transportation Needs:   . Film/video editor (Medical):   Marland Kitchen Lack of Transportation (Non-Medical):   Physical Activity:   . Days of Exercise per Week:   . Minutes of  Exercise per Session:   Stress:   . Feeling of Stress :   Social Connections:   . Frequency of Communication with Friends and Family:   . Frequency of Social Gatherings with Friends and Family:   . Attends Religious Services:   . Active Member of Clubs or Organizations:   . Attends Archivist Meetings:   Marland Kitchen Marital Status:   Intimate Partner Violence:   . Fear of Current or Ex-Partner:   . Emotionally Abused:   Marland Kitchen Physically Abused:   . Sexually Abused:     Outpatient Medications Prior to Visit  Medication Sig Dispense Refill  . AMBULATORY NON FORMULARY MEDICATION Medication Name:  Nitroglycerin ointment 0.125% Use pea size amount three times day  per rectum 30 g 0  . AMBULATORY NON FORMULARY MEDICATION Medication Name: Nitroglycerin 0.125% use pea sized amount per rectum  three times a day for 4 weeks 30 g 1  . amLODipine (NORVASC) 5 MG tablet Take 1 tablet (5 mg total) by mouth daily. 90 tablet 2  . atorvastatin (LIPITOR) 40 MG tablet Take 1 tablet (40 mg total) by mouth daily at 6 PM. 90 tablet 1  . Calcium Carbonate-Vitamin D (CALTRATE 600+D PO) Take by mouth.    . Cholecalciferol (VITAMIN D3) 1000 units CAPS Take by mouth.    . furosemide (LASIX) 20 MG tablet Take 1 tablet (20 mg total) by mouth daily. As needed for edema. 30 tablet 6  . latanoprost (XALATAN) 0.005 % ophthalmic solution Place 1 drop into both eyes daily.    . meclizine (ANTIVERT) 25 MG tablet Take 1 tablet (25 mg total) by mouth 3 (three) times daily as needed for dizziness. 90 tablet 2  . omeprazole (PRILOSEC) 40 MG capsule Take 1 capsule (40 mg total) by mouth daily. 90 capsule 2  . tamoxifen (NOLVADEX) 20 MG tablet Take 1 tablet (20 mg total) by mouth daily. 90 tablet 3  . timolol (BETIMOL) 0.5 % ophthalmic solution 1 drop once.    . timolol (TIMOPTIC) 0.5 % ophthalmic solution Place 1 drop into both eyes every morning.    . Wheat Dextrin (BENEFIBER) POWD Take by mouth daily.    . VOLTAREN 1 % GEL Apply 2 g topically 3 (three) times daily. 100 g 6   No facility-administered medications prior to visit.    No Known Allergies  ROS Review of Systems  Constitutional: Negative.   HENT: Negative.   Eyes: Negative.   Respiratory: Negative.   Cardiovascular: Negative.   Endocrine: Negative.   Genitourinary: Negative.   Allergic/Immunologic: Negative.   Neurological: Positive for dizziness (occasional ) and headaches (occasional ).      Objective:    Physical Exam Vitals and nursing note reviewed.  Constitutional:      Appearance: Normal appearance.  HENT:     Head: Normocephalic and atraumatic.     Nose: Nose normal.     Mouth/Throat:     Mouth:  Mucous membranes are moist.     Pharynx: Oropharynx is clear.  Cardiovascular:     Rate and Rhythm: Normal rate and regular rhythm.     Pulses: Normal pulses.     Heart sounds: Normal heart sounds.  Pulmonary:     Effort: Pulmonary effort is normal.     Breath sounds: Normal breath sounds.  Abdominal:     General: Bowel sounds are normal.     Palpations: Abdomen is soft.  Musculoskeletal:        General: Normal range  of motion.     Cervical back: Normal range of motion and neck supple.  Skin:    General: Skin is warm.  Neurological:     General: No focal deficit present.     Mental Status: She is alert and oriented to person, place, and time.  Psychiatric:        Mood and Affect: Mood normal.        Behavior: Behavior normal.        Thought Content: Thought content normal.        Judgment: Judgment normal.     LMP  (LMP Unknown)  Wt Readings from Last 3 Encounters:  10/18/19 195 lb (88.5 kg)  09/05/19 185 lb (83.9 kg)  04/26/19 194 lb 12.8 oz (88.4 kg)     Health Maintenance Due  Topic Date Due  . Hepatitis C Screening  Never done  . COVID-19 Vaccine (1) Never done  . PAP SMEAR-Modifier  Never done  . INFLUENZA VACCINE  10/20/2019    There are no preventive care reminders to display for this patient.  Lab Results  Component Value Date   TSH 1.420 04/26/2019   Lab Results  Component Value Date   WBC 6.8 04/26/2019   HGB 11.4 04/26/2019   HCT 34.1 04/26/2019   MCV 84 04/26/2019   PLT 226 04/26/2019   Lab Results  Component Value Date   NA 141 04/26/2019   K 3.9 04/26/2019   CHLORIDE 110 (H) 01/24/2017   CO2 19 (L) 04/26/2019   GLUCOSE 110 (H) 04/26/2019   BUN 12 04/26/2019   CREATININE 0.61 04/26/2019   BILITOT 0.3 04/26/2019   ALKPHOS 88 04/26/2019   AST 56 (H) 04/26/2019   ALT 28 04/26/2019   PROT 6.5 04/26/2019   ALBUMIN 3.7 (L) 04/26/2019   CALCIUM 9.4 04/26/2019   ANIONGAP 8 06/15/2017   EGFR >60 01/24/2017   Lab Results  Component  Value Date   CHOL 104 04/26/2019   Lab Results  Component Value Date   HDL 32 (L) 04/26/2019   Lab Results  Component Value Date   LDLCALC 52 04/26/2019   Lab Results  Component Value Date   TRIG 107 04/26/2019   Lab Results  Component Value Date   CHOLHDL 3.3 04/26/2019   Lab Results  Component Value Date   HGBA1C 5.8 (A) 04/26/2019      Assessment & Plan:   1. Essential hypertension The current medical regimen is effective; blood pressure is stable today; continue present plan and medications as prescribed. She will continue to take medications as prescribed, to decrease high sodium intake, excessive alcohol intake, increase potassium intake, smoking cessation, and increase physical activity of at least 30 minutes of cardio activity daily. She will continue to follow Heart Healthy or DASH diet.  2. History of anal fissures Stable.today.   3. Bilateral foot pain Several options reviewed with patient in the aid of her foot pain.   4. Arthralgia, unspecified joint - diclofenac Sodium (VOLTAREN) 1 % GEL; Apply 4 g topically 4 (four) times daily.  Dispense: 100 g; Refill: 11  5. Vitamin B12 deficiency We will re-assess today.  - Vitamin B12  6. Follow up She will follow up in 6 months.   Meds ordered this encounter  Medications  . diclofenac Sodium (VOLTAREN) 1 % GEL    Sig: Apply 4 g topically 4 (four) times daily.    Dispense:  100 g    Refill:  11  . DISCONTD: vitamin  B-12 (CYANOCOBALAMIN) 1000 MCG tablet    Sig: Take 1 tablet (1,000 mcg total) by mouth daily.    Dispense:  100 tablet    Refill:  3    Orders Placed This Encounter  Procedures  . Vitamin B12    Referral Orders  No referral(s) requested today    Kathe Becton,  MSN, FNP-BC [place], [place] [address] [phone]  Problem List Items Addressed This  Visit      Cardiovascular and Mediastinum   Essential hypertension - Primary    Other Visit Diagnoses    History of anal fissures       Bilateral foot pain       Arthralgia, unspecified joint       Relevant Medications   diclofenac Sodium (VOLTAREN) 1 % GEL   Vitamin B12 deficiency       Relevant Orders   Vitamin B12 (Completed)   Follow up          Meds ordered this encounter  Medications  . diclofenac Sodium (VOLTAREN) 1 % GEL    Sig: Apply 4 g topically 4 (four) times daily.    Dispense:  100 g    Refill:  11  . DISCONTD: vitamin B-12 (CYANOCOBALAMIN) 1000 MCG tablet    Sig: Take 1 tablet (1,000 mcg total) by mouth daily.    Dispense:  100 tablet    Refill:  3    Follow-up: Return in about 6 months (around 04/26/2020) for Labs/OV.    Azzie Glatter, FNP

## 2019-10-26 LAB — VITAMIN B12: Vitamin B-12: 181 pg/mL — ABNORMAL LOW (ref 232–1245)

## 2019-10-28 ENCOUNTER — Other Ambulatory Visit: Payer: Self-pay | Admitting: Family Medicine

## 2019-10-28 ENCOUNTER — Encounter: Payer: Self-pay | Admitting: Family Medicine

## 2019-10-28 ENCOUNTER — Telehealth: Payer: Self-pay

## 2019-10-28 DIAGNOSIS — E538 Deficiency of other specified B group vitamins: Secondary | ICD-10-CM

## 2019-10-28 MED ORDER — CYANOCOBALAMIN 1000 MCG/ML IJ SOLN: 1000.0000 ug | INTRAMUSCULAR | 6 refills | Status: DC

## 2019-10-28 NOTE — Telephone Encounter (Signed)
-----   Message from Azzie Glatter, [place] sent at 10/28/2019  8:58 AM EDT ----- (Patient needs interpreter, but son comes to all office visits and can interpret if available)  Vitamin B-12 levels continue to decrease. New Rx for Vitamin B-12 sent to pharmacy today. She is to discontinue oral dose. Son will need to schedule appointment for Nurse Visit to have injection administered at our office. Please inform.   Keep follow up appointment. Thank you.

## 2019-10-28 NOTE — Telephone Encounter (Signed)
Called and spoke with patient's daughter Olena Leatherwood (on hippa) Advised that b12 level was still decreased, asked that she stop taking by mouth vitamin B12 and start once monthly injection. Advised that medication has been sent to pharmacy and first appointment for injection was scheduled for 11/01/2019. Thanks!

## 2019-10-31 ENCOUNTER — Encounter: Payer: Self-pay | Admitting: Family Medicine

## 2019-11-01 ENCOUNTER — Ambulatory Visit (INDEPENDENT_AMBULATORY_CARE_PROVIDER_SITE_OTHER): Payer: Medicaid Other

## 2019-11-01 ENCOUNTER — Other Ambulatory Visit: Payer: Self-pay

## 2019-11-01 DIAGNOSIS — E538 Deficiency of other specified B group vitamins: Secondary | ICD-10-CM | POA: Diagnosis not present

## 2019-11-01 MED ORDER — CYANOCOBALAMIN 1000 MCG/ML IJ SOLN
1000.0000 ug | Freq: Once | INTRAMUSCULAR | Status: AC
  Administered 2019-11-01: 1000 ug via INTRAMUSCULAR

## 2019-11-06 ENCOUNTER — Telehealth: Payer: Self-pay | Admitting: Family Medicine

## 2019-11-06 NOTE — Telephone Encounter (Signed)
Done

## 2019-11-18 ENCOUNTER — Telehealth: Payer: Self-pay | Admitting: Family Medicine

## 2019-11-18 ENCOUNTER — Other Ambulatory Visit: Payer: Self-pay | Admitting: Family Medicine

## 2019-11-18 DIAGNOSIS — K219 Gastro-esophageal reflux disease without esophagitis: Secondary | ICD-10-CM

## 2019-11-18 MED ORDER — OMEPRAZOLE 40 MG PO CPDR: 40.0000 mg | DELAYED_RELEASE_CAPSULE | Freq: Every day | ORAL | 2 refills | Status: DC

## 2019-11-18 NOTE — Telephone Encounter (Signed)
error 

## 2019-11-19 NOTE — Telephone Encounter (Signed)
Done

## 2019-12-02 ENCOUNTER — Other Ambulatory Visit: Payer: Self-pay

## 2019-12-02 ENCOUNTER — Ambulatory Visit: Payer: Medicaid Other

## 2019-12-02 DIAGNOSIS — E538 Deficiency of other specified B group vitamins: Secondary | ICD-10-CM

## 2019-12-02 MED ORDER — CYANOCOBALAMIN 1000 MCG/ML IJ SOLN
1000.0000 ug | Freq: Once | INTRAMUSCULAR | Status: AC
  Administered 2019-12-02: 1000 ug via INTRAMUSCULAR

## 2019-12-06 ENCOUNTER — Other Ambulatory Visit: Payer: Self-pay | Admitting: *Deleted

## 2019-12-06 MED ORDER — TAMOXIFEN CITRATE 20 MG PO TABS
20.0000 mg | ORAL_TABLET | Freq: Every day | ORAL | 3 refills | Status: DC
Start: 2019-12-06 — End: 2020-01-09

## 2019-12-30 ENCOUNTER — Other Ambulatory Visit: Payer: Self-pay

## 2019-12-30 ENCOUNTER — Ambulatory Visit: Payer: Medicaid Other

## 2019-12-30 VITALS — BP 136/74 | HR 76 | Temp 97.2°F | Resp 16

## 2019-12-30 DIAGNOSIS — Z0000 Encounter for general adult medical examination without abnormal findings: Secondary | ICD-10-CM

## 2019-12-30 DIAGNOSIS — E538 Deficiency of other specified B group vitamins: Secondary | ICD-10-CM

## 2019-12-30 MED ORDER — CYANOCOBALAMIN 1000 MCG/ML IJ SOLN
1000.0000 ug | Freq: Once | INTRAMUSCULAR | Status: AC
  Administered 2019-12-30: 1000 ug via INTRAMUSCULAR

## 2020-01-08 NOTE — Progress Notes (Signed)
[place]  [phone] [phone]     ID: Alexandra Beasley DOB: Feb 10, 1966  MR#: [id number]  QQI#:[id number]  Patient Care Team: Azzie Glatter, FNP as PCP - General (Family Medicine) Kingstyn Deruiter, Virgie Dad, MD as Consulting Physician (Oncology) Fanny Skates, MD as Consulting Physician (General Surgery) Mauri Pole, MD as Consulting Physician (Gastroenterology) OTHER MD:  CHIEF COMPLAINT: Atypical lobular hyperplasia  CURRENT TREATMENT: Tamoxifen; B12 supplementation   INTERVAL HISTORY: Alexandra Beasley returns today for follow-up of her history of atypical lobular hyperplasia, accompanied by her daughter to translate.   Alexandra Beasley continues on tamoxifen.  She tolerates this with no side effects that she is aware of.  She has been on short-term mammographic follow up for left breast calcifications. Diagnostic left mammogram on 02/27/2019 and diagnostic bilateral mammogram on 09/10/2019 both showed breast density category B and no evidence of malignancy in either breast.  She is now resuming yearly mammography, with the next mammogram due June 2022  In addition Alexandra Beasley is receiving B12 supplementations every 4 weeks.  She says she used to have pain in her heels and since starting the B12 shots that problem has resolved   REVIEW OF SYSTEMS: Alexandra Beasley is not exercising regularly.  She has no equipment at home.  She is not a swimmer and actually does not know how to swim.  She does a little bit of gardening and a little bit of cooking.  If she goes walking her ankle swell and she does not like that.  The family is very concerned because the patient has glaucoma and cataracts and they are not able to get an ophthalmology appointment before December.  Similarly she needs a dentist.  Aside from this a detailed review of systems today was stable   HISTORY OF CURRENT ILLNESS: From the original intake note:  "Alexandra Beasley" [[id number]] had bilateral screening  mammography for 12/08/2016 at [facility]. There was a possible distortion in the left breast, and on 07/21/2016 she'll she underwent left diagnostic mammography with tomography and left breast ultrasonography. The breast density was category B. This confirmed an area of distortion in the lower inner left breast which was not palpable. Ultrasonography found no definite correlate. The axilla was negative.  Biopsy of this area may 07/08/2016 showed ([id number]) a complex sclerosing lesion. Accordingly the patient was referred to surgery and on 08/31/2016 underwent left lumpectomy, which showed atypical lobular hyperplasia ([id number]). She was then referred to the high risk clinic. There has been some delay because of [facility] approval issues  The patient's subsequent history is as detailed below.   PAST MEDICAL HISTORY: Past Medical History:  Diagnosis Date  . Abnormal mammogram of left breast 08/31/2016  . Arthritis    knee  . Atypical ductal hyperplasia of left breast   . Bilateral lower extremity edema 04/2019  . Bilateral lower extremity edema 04/2019  . Breast mass, left   . GERD (gastroesophageal reflux disease)   . Glaucoma   . Hyperlipidemia   . Hypertension   . Other skin changes 04/2019  . SVD (spontaneous vaginal delivery)    x 2  . Vitamin D deficiency 10/2019    PAST SURGICAL HISTORY: Past Surgical History:  Procedure Laterality Date  . ABDOMINAL HYSTERECTOMY    . BREAST EXCISIONAL BIOPSY Left 2018   benign lumpectomy  . BREAST LUMPECTOMY WITH RADIOACTIVE SEED LOCALIZATION Left 08/31/2016   Procedure: BREAST LUMPECTOMY WITH RADIOACTIVE SEED LOCALIZATION;  Surgeon: Fanny Skates, MD;  Location: [facility];  Service: General;  Laterality: Left;  . CESAREAN SECTION     patient denies this surgery    FAMILY HISTORY Family History  Problem Relation Age of Onset  . Vision loss Mother   . Hypertension Mother   . Heart disease Father   . Colon  cancer Neg Hx   . Rectal cancer Neg Hx   . Stomach cancer Neg Hx   The patient's father is 70 years old and her mother 10 years old as of August 2018. The patient has one brother and one sister. There is no history of breast or ovarian cancer in the family.   GYNECOLOGIC HISTORY:  No LMP recorded (lmp unknown). Patient has had a hysterectomy. Menarche age 30, first live birth age 40, the patient is [place] P2. She is status post remote hysterectomy, without salpingo-oophorectomy   SOCIAL HISTORY:  She is originally from [place]. She is married, her husband has a history of head and neck cancer and is disabled as a result. He is followed at [facility]. The patient's daughter Alexandra Beasley owns 6 convenience stores. The patient's son Alexandra Beasley is studying pharmacy at [employer]. The patient has one granddaughter. She attends a local [employer]    ADVANCED DIRECTIVES: In the absence of any documentation to the contrary, the patient's spouse is their HCPOA.   HEALTH MAINTENANCE: Social History   Tobacco Use  . Smoking status: Never Smoker  . Smokeless tobacco: Never Used  Vaping Use  . Vaping Use: Never used  Substance Use Topics  . Alcohol use: No    Alcohol/week: 0.0 standard drinks  . Drug use: No     Colonoscopy: Overdue  PAP: Status post hysterectomy  Bone density: 07/27/2017 showed a T score of -1.8 osteopenia   No Known Allergies  Current Outpatient Medications  Medication Sig Dispense Refill  . AMBULATORY NON FORMULARY MEDICATION Medication Name:  Nitroglycerin ointment 0.125% Use pea size amount three times day per rectum 30 g 0  . AMBULATORY NON FORMULARY MEDICATION Medication Name: Nitroglycerin 0.125% use pea sized amount per rectum  three times a day for 4 weeks 30 g 1  . amLODipine (NORVASC) 5 MG tablet Take 1 tablet (5 mg total) by mouth daily. 90 tablet 2  . atorvastatin (LIPITOR) 40 MG tablet Take 1 tablet (40 mg total) by mouth daily at 6 PM. 90 tablet 1  . Calcium  Carbonate-Vitamin D (CALTRATE 600+D PO) Take by mouth.    . Cholecalciferol (VITAMIN D3) 1000 units CAPS Take by mouth.    . cyanocobalamin (,VITAMIN B-12,) 1000 MCG/ML injection Inject 1 mL (1,000 mcg total) into the muscle every 30 (thirty) days. 1 mL 6  . diclofenac Sodium (VOLTAREN) 1 % GEL Apply 4 g topically 4 (four) times daily. 100 g 11  . furosemide (LASIX) 20 MG tablet Take 1 tablet (20 mg total) by mouth daily. As needed for edema. 30 tablet 6  . latanoprost (XALATAN) 0.005 % ophthalmic solution Place 1 drop into both eyes daily.    . meclizine (ANTIVERT) 25 MG tablet Take 1 tablet (25 mg total) by mouth 3 (three) times daily as needed for dizziness. 90 tablet 2  . omeprazole (PRILOSEC) 40 MG capsule Take 1 capsule (40 mg total) by mouth daily. 90 capsule 2  . tamoxifen (NOLVADEX) 20 MG tablet Take 1 tablet (20 mg total) by mouth daily. 90 tablet 4  . timolol (BETIMOL) 0.5 % ophthalmic solution 1 drop once.    . timolol (  TIMOPTIC) 0.5 % ophthalmic solution Place 1 drop into both eyes every morning.    . Wheat Dextrin (BENEFIBER) POWD Take by mouth daily.     No current facility-administered medications for this visit.    OBJECTIVE: [place] woman in no acute distress Vitals:   01/09/20 0900  BP: 118/74  Pulse: 80  Resp: 18  Temp: 98.1 F (36.7 C)  SpO2: 100%     Body mass index is 34.24 kg/m.   Wt Readings from Last 3 Encounters:  01/09/20 193 lb 4.8 oz (87.7 kg)  10/18/19 195 lb (88.5 kg)  09/05/19 185 lb (83.9 kg)      ECOG FS:1 - Symptomatic but completely ambulatory  Sclerae unicteric, EOMs intact Wearing a mask No cervical or supraclavicular adenopathy Lungs no rales or rhonchi Heart regular rate and rhythm Abd soft, nontender, positive bowel sounds MSK no focal spinal tenderness, no upper extremity lymphedema Neuro: nonfocal, well oriented, appropriate affect Breasts: I do not palpate a mass in either breast.  Both axillae are benign.   LAB  RESULTS:  CMP     Component Value Date/Time   NA 141 04/26/2019 1115   NA 141 01/24/2017 1018   K 3.9 04/26/2019 1115   K 4.0 01/24/2017 1018   CL 109 (H) 04/26/2019 1115   CO2 19 (L) 04/26/2019 1115   CO2 25 01/24/2017 1018   GLUCOSE 110 (H) 04/26/2019 1115   GLUCOSE 91 06/15/2017 1139   GLUCOSE 94 01/24/2017 1018   BUN 12 04/26/2019 1115   BUN 9.6 01/24/2017 1018   CREATININE 0.61 04/26/2019 1115   CREATININE 0.7 01/24/2017 1018   CALCIUM 9.4 04/26/2019 1115   CALCIUM 9.7 01/24/2017 1018   PROT 6.5 04/26/2019 1115   PROT 7.5 01/24/2017 1018   ALBUMIN 3.7 (L) 04/26/2019 1115   ALBUMIN 3.6 01/24/2017 1018   AST 56 (H) 04/26/2019 1115   AST 26 01/24/2017 1018   ALT 28 04/26/2019 1115   ALT 26 01/24/2017 1018   ALKPHOS 88 04/26/2019 1115   ALKPHOS 87 01/24/2017 1018   BILITOT 0.3 04/26/2019 1115   BILITOT 0.40 01/24/2017 1018   GFRNONAA 102 04/26/2019 1115   GFRNONAA >89 07/04/2016 1115   GFRAA 118 04/26/2019 1115   GFRAA >89 07/04/2016 1115    Lab Results  Component Value Date   WBC 6.8 04/26/2019   NEUTROABS 4.8 04/26/2019   HGB 11.4 04/26/2019   HCT 34.1 04/26/2019   MCV 84 04/26/2019   PLT 226 04/26/2019      Chemistry      Component Value Date/Time   NA 141 04/26/2019 1115   NA 141 01/24/2017 1018   K 3.9 04/26/2019 1115   K 4.0 01/24/2017 1018   CL 109 (H) 04/26/2019 1115   CO2 19 (L) 04/26/2019 1115   CO2 25 01/24/2017 1018   BUN 12 04/26/2019 1115   BUN 9.6 01/24/2017 1018   CREATININE 0.61 04/26/2019 1115   CREATININE 0.7 01/24/2017 1018      Component Value Date/Time   CALCIUM 9.4 04/26/2019 1115   CALCIUM 9.7 01/24/2017 1018   ALKPHOS 88 04/26/2019 1115   ALKPHOS 87 01/24/2017 1018   AST 56 (H) 04/26/2019 1115   AST 26 01/24/2017 1018   ALT 28 04/26/2019 1115   ALT 26 01/24/2017 1018   BILITOT 0.3 04/26/2019 1115   BILITOT 0.40 01/24/2017 1018       No results found for: LABCA2  No components found for: [id number]  No results  for input(s): INR in the last 168 hours.  No results found for: LABCA2  No results found for: TDV761  No results found for: YWV371  No results found for: GGY694  No results found for: CA2729  No components found for: HGQUANT  No results found for: CEA1 / No results found for: CEA1   No results found for: AFPTUMOR  No results found for: [place]  No visits with results within 3 Day(s) from this visit.  Latest known visit with results is:  Office Visit on 10/25/2019  Component Date Value Ref Range Status  . Vitamin B-12 10/25/2019 181* 232 - 1,245 pg/mL Final    (this displays the last labs from the last 3 days)  No results found for: TOTALPROTELP, ALBUMINELP, A1GS, A2GS, BETS, BETA2SER, GAMS, MSPIKE, SPEI (this displays SPEP labs)  No results found for: KPAFRELGTCHN, LAMBDASER, KAPLAMBRATIO (kappa/lambda light chains)  No results found for: HGBA, HGBA2QUANT, HGBFQUANT, HGBSQUAN (Hemoglobinopathy evaluation)   No results found for: LDH  No results found for: IRON, TIBC, IRONPCTSAT (Iron and TIBC)  No results found for: FERRITIN  Urinalysis    Component Value Date/Time   LABSPEC >=1.030 05/12/2017 0942   PHURINE 5.5 05/12/2017 0942   GLUCOSEU NEGATIVE 05/12/2017 0942   HGBUR NEGATIVE 05/12/2017 0942   BILIRUBINUR Negative 04/26/2019 1100   KETONESUR negative 10/24/2018 0915   KETONESUR NEGATIVE 05/12/2017 0942   PROTEINUR Negative 04/26/2019 1100   PROTEINUR NEGATIVE 05/12/2017 0942   UROBILINOGEN 0.2 04/26/2019 1100   UROBILINOGEN 0.2 05/12/2017 0942   NITRITE Negative 04/26/2019 1100   NITRITE NEGATIVE 05/12/2017 0942   LEUKOCYTESUR Negative 04/26/2019 1100    STUDIES: No results found.   ASSESSMENT: 54 y.o. [place] woman status post left breast lumpectomy 08/31/2016 for atypical lobular hyperplasia  (1) anastrozole started 11/07/2016, discontinued February 07, 2017 with poor tolerance  (2) started tamoxifen 03/21/17  (a) status post remote  hysterectomy, without salpingo-oophorectomy  (3) Osteopenia: bone density 07/27/2017 showed a T score of -1.8   (4) B12 deficiency: On monthly supplementation   PLAN: Alexandra Beasley is now close to 3 years into her tamoxifen for breast cancer prophylaxis.  She is tolerating this well and the plan is to continue to a total of 5 years.  She is on B12 supplementation monthly.  This seems to have improved some of her symptoms.  The plan is to continue indefinitely  I am concerned about her glaucoma and I gave her the phone number of [facility] ophthalmology to see if they can get an earlier appointment there.  They understand that tamoxifen does accelerate cataracts and she may need cataract surgery sometime in the near future  Otherwise the patient will have mammography in June and see [place] again in July of next year.  They know to call for any other issue that may develop before then  Total encounter time 25 minutes.* Carlton Sweaney, Virgie Dad, MD  01/09/20 9:19 AM Medical Oncology and Hematology [facility] [place], [place] [address] Tel. [phone]    Fax. [phone]   I, Wilburn Mylar, am acting as scribe for Dr. Virgie Dad. Paislee Szatkowski.  I, Lurline Del MD, have reviewed the above documentation for accuracy and completeness, and I agree with the above.   *Total Encounter Time as defined by the Centers for Medicare and Medicaid Services includes, in addition to the face-to-face time of a patient visit (documented in the note above) non-face-to-face time: obtaining and reviewing outside history, ordering and reviewing medications, tests or  procedures, care coordination (communications with other health care professionals or caregivers) and documentation in the medical record.

## 2020-01-09 ENCOUNTER — Inpatient Hospital Stay: Payer: Medicaid Other | Attending: Oncology | Admitting: Oncology

## 2020-01-09 ENCOUNTER — Other Ambulatory Visit: Payer: Self-pay

## 2020-01-09 VITALS — BP 118/74 | HR 80 | Temp 98.1°F | Resp 18 | Ht 63.0 in | Wt 193.3 lb

## 2020-01-09 DIAGNOSIS — K219 Gastro-esophageal reflux disease without esophagitis: Secondary | ICD-10-CM | POA: Insufficient documentation

## 2020-01-09 DIAGNOSIS — R609 Edema, unspecified: Secondary | ICD-10-CM | POA: Diagnosis not present

## 2020-01-09 DIAGNOSIS — E785 Hyperlipidemia, unspecified: Secondary | ICD-10-CM | POA: Insufficient documentation

## 2020-01-09 DIAGNOSIS — E538 Deficiency of other specified B group vitamins: Secondary | ICD-10-CM | POA: Diagnosis not present

## 2020-01-09 DIAGNOSIS — I10 Essential (primary) hypertension: Secondary | ICD-10-CM | POA: Insufficient documentation

## 2020-01-09 DIAGNOSIS — N6092 Unspecified benign mammary dysplasia of left breast: Secondary | ICD-10-CM

## 2020-01-09 DIAGNOSIS — M8580 Other specified disorders of bone density and structure, unspecified site: Secondary | ICD-10-CM | POA: Diagnosis not present

## 2020-01-09 DIAGNOSIS — M1990 Unspecified osteoarthritis, unspecified site: Secondary | ICD-10-CM | POA: Insufficient documentation

## 2020-01-09 DIAGNOSIS — M79676 Pain in unspecified toe(s): Secondary | ICD-10-CM

## 2020-01-09 DIAGNOSIS — Z79899 Other long term (current) drug therapy: Secondary | ICD-10-CM | POA: Insufficient documentation

## 2020-01-09 DIAGNOSIS — Z79810 Long term (current) use of selective estrogen receptor modulators (SERMs): Secondary | ICD-10-CM | POA: Diagnosis not present

## 2020-01-09 DIAGNOSIS — Z780 Asymptomatic menopausal state: Secondary | ICD-10-CM

## 2020-01-09 MED ORDER — TAMOXIFEN CITRATE 20 MG PO TABS
20.0000 mg | ORAL_TABLET | Freq: Every day | ORAL | 4 refills | Status: DC
Start: 2020-01-09 — End: 2020-03-01

## 2020-01-11 ENCOUNTER — Other Ambulatory Visit: Payer: Self-pay | Admitting: Family Medicine

## 2020-01-11 DIAGNOSIS — I10 Essential (primary) hypertension: Secondary | ICD-10-CM

## 2020-01-12 ENCOUNTER — Other Ambulatory Visit: Payer: Self-pay | Admitting: Family Medicine

## 2020-01-12 DIAGNOSIS — E785 Hyperlipidemia, unspecified: Secondary | ICD-10-CM

## 2020-01-13 ENCOUNTER — Other Ambulatory Visit: Payer: Self-pay | Admitting: Nurse Practitioner

## 2020-01-13 DIAGNOSIS — I10 Essential (primary) hypertension: Secondary | ICD-10-CM

## 2020-01-13 MED ORDER — AMLODIPINE BESYLATE 5 MG PO TABS: 5.0000 mg | ORAL_TABLET | Freq: Every day | ORAL | 3 refills | Status: DC

## 2020-01-27 ENCOUNTER — Inpatient Hospital Stay: Payer: Medicaid Other | Attending: Oncology

## 2020-01-30 ENCOUNTER — Ambulatory Visit: Payer: Medicaid Other

## 2020-01-30 ENCOUNTER — Other Ambulatory Visit: Payer: Self-pay

## 2020-01-30 DIAGNOSIS — E538 Deficiency of other specified B group vitamins: Secondary | ICD-10-CM

## 2020-01-30 MED ORDER — CYANOCOBALAMIN 1000 MCG/ML IJ SOLN
1000.0000 ug | Freq: Once | INTRAMUSCULAR | Status: AC
  Administered 2020-01-30: 1000 ug via INTRAMUSCULAR

## 2020-02-24 ENCOUNTER — Inpatient Hospital Stay: Payer: Medicaid Other | Attending: Oncology

## 2020-02-29 ENCOUNTER — Other Ambulatory Visit: Payer: Self-pay | Admitting: Oncology

## 2020-03-02 ENCOUNTER — Other Ambulatory Visit: Payer: Self-pay

## 2020-03-02 ENCOUNTER — Ambulatory Visit (INDEPENDENT_AMBULATORY_CARE_PROVIDER_SITE_OTHER): Payer: Medicaid Other | Admitting: Nurse Practitioner

## 2020-03-02 DIAGNOSIS — E538 Deficiency of other specified B group vitamins: Secondary | ICD-10-CM

## 2020-03-02 MED ORDER — CYANOCOBALAMIN 1000 MCG/ML IJ SOLN
1000.0000 ug | Freq: Once | INTRAMUSCULAR | Status: AC
  Administered 2020-03-02: 09:00:00 1000 ug via INTRAMUSCULAR

## 2020-03-02 NOTE — Progress Notes (Signed)
Vitamin B12 injection given in R deltoid, pt tolerated well w/o adverse reaction noted  Signed and Held Orders    None

## 2020-03-23 ENCOUNTER — Other Ambulatory Visit: Payer: Self-pay

## 2020-03-23 ENCOUNTER — Other Ambulatory Visit: Payer: Self-pay | Admitting: *Deleted

## 2020-03-23 ENCOUNTER — Inpatient Hospital Stay: Payer: Medicaid Other | Attending: Oncology

## 2020-03-23 VITALS — BP 132/79 | HR 57 | Temp 97.8°F | Resp 16

## 2020-03-23 DIAGNOSIS — E538 Deficiency of other specified B group vitamins: Secondary | ICD-10-CM | POA: Diagnosis not present

## 2020-03-23 DIAGNOSIS — N6092 Unspecified benign mammary dysplasia of left breast: Secondary | ICD-10-CM | POA: Diagnosis present

## 2020-03-23 DIAGNOSIS — Z79899 Other long term (current) drug therapy: Secondary | ICD-10-CM | POA: Diagnosis not present

## 2020-03-23 MED ORDER — CYANOCOBALAMIN 1000 MCG/ML IJ SOLN
INTRAMUSCULAR | Status: AC
  Filled 2020-03-23: qty 1

## 2020-03-23 MED ORDER — CYANOCOBALAMIN 1000 MCG/ML IJ SOLN
1000.0000 ug | Freq: Once | INTRAMUSCULAR | Status: AC
  Administered 2020-03-23: 1000 ug via INTRAMUSCULAR

## 2020-03-23 NOTE — Patient Instructions (Signed)

## 2020-04-20 ENCOUNTER — Other Ambulatory Visit: Payer: Self-pay

## 2020-04-20 ENCOUNTER — Inpatient Hospital Stay: Payer: Medicaid Other

## 2020-04-20 DIAGNOSIS — E538 Deficiency of other specified B group vitamins: Secondary | ICD-10-CM

## 2020-04-20 DIAGNOSIS — N6092 Unspecified benign mammary dysplasia of left breast: Secondary | ICD-10-CM | POA: Diagnosis not present

## 2020-04-20 MED ORDER — CYANOCOBALAMIN 1000 MCG/ML IJ SOLN
INTRAMUSCULAR | Status: AC
  Filled 2020-04-20: qty 1

## 2020-04-20 MED ORDER — CYANOCOBALAMIN 1000 MCG/ML IJ SOLN
1000.0000 ug | Freq: Once | INTRAMUSCULAR | Status: AC
  Administered 2020-04-20: 1000 ug via INTRAMUSCULAR

## 2020-04-20 NOTE — Patient Instructions (Signed)

## 2020-04-22 ENCOUNTER — Other Ambulatory Visit: Payer: Self-pay | Admitting: Oncology

## 2020-04-23 ENCOUNTER — Telehealth: Payer: Self-pay | Admitting: Oncology

## 2020-04-23 NOTE — Telephone Encounter (Signed)
Scheduled injection appointments per 2/2 schedule message. Patient's daughter is aware.

## 2020-04-27 ENCOUNTER — Ambulatory Visit (INDEPENDENT_AMBULATORY_CARE_PROVIDER_SITE_OTHER): Payer: Medicaid Other | Admitting: Family Medicine

## 2020-04-27 ENCOUNTER — Encounter: Payer: Self-pay | Admitting: Family Medicine

## 2020-04-27 ENCOUNTER — Other Ambulatory Visit: Payer: Self-pay

## 2020-04-27 VITALS — BP 126/65 | HR 71 | Temp 97.7°F | Ht 63.0 in | Wt 189.4 lb

## 2020-04-27 DIAGNOSIS — Z9849 Cataract extraction status, unspecified eye: Secondary | ICD-10-CM | POA: Diagnosis not present

## 2020-04-27 DIAGNOSIS — Z0000 Encounter for general adult medical examination without abnormal findings: Secondary | ICD-10-CM

## 2020-04-27 DIAGNOSIS — M722 Plantar fascial fibromatosis: Secondary | ICD-10-CM

## 2020-04-27 DIAGNOSIS — M25561 Pain in right knee: Secondary | ICD-10-CM

## 2020-04-27 DIAGNOSIS — G8929 Other chronic pain: Secondary | ICD-10-CM

## 2020-04-27 DIAGNOSIS — Z789 Other specified health status: Secondary | ICD-10-CM | POA: Diagnosis not present

## 2020-04-27 DIAGNOSIS — Z758 Other problems related to medical facilities and other health care: Secondary | ICD-10-CM

## 2020-04-27 DIAGNOSIS — I10 Essential (primary) hypertension: Secondary | ICD-10-CM

## 2020-04-27 DIAGNOSIS — K219 Gastro-esophageal reflux disease without esophagitis: Secondary | ICD-10-CM

## 2020-04-27 DIAGNOSIS — M25512 Pain in left shoulder: Secondary | ICD-10-CM

## 2020-04-27 DIAGNOSIS — E538 Deficiency of other specified B group vitamins: Secondary | ICD-10-CM | POA: Diagnosis not present

## 2020-04-27 DIAGNOSIS — M25511 Pain in right shoulder: Secondary | ICD-10-CM

## 2020-04-27 DIAGNOSIS — E785 Hyperlipidemia, unspecified: Secondary | ICD-10-CM

## 2020-04-27 DIAGNOSIS — M25562 Pain in left knee: Secondary | ICD-10-CM

## 2020-04-27 DIAGNOSIS — Z09 Encounter for follow-up examination after completed treatment for conditions other than malignant neoplasm: Secondary | ICD-10-CM

## 2020-04-27 NOTE — Progress Notes (Signed)
Patient [facility]   Annual Physical   Subjective:  Patient ID: Alexandra Beasley, female    DOB: [birth year]  Age: 55 y.o. MRN: [id number]  CC:  Chief Complaint  Patient presents with  . Follow-up    Follow up, check vit b12 level    Alexandra Beasley is a 55 year old presents for Follow Up today.  Patient Active Problem List   Diagnosis Date Noted  . B12 deficiency 01/09/2020  . Plantar fasciitis, bilateral 09/05/2019  . Bilateral swelling of feet and ankles 04/27/2019  . Essential hypertension 04/27/2019  . Other skin changes 04/27/2019  . Dizziness 04/27/2019  . Gastroesophageal reflux disease without esophagitis 04/27/2019  . Osteopenia after menopause 12/18/2017  . 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 Abnormal mammogram of left breast 08/31/2016  . Glaucoma 06/04/2015  . Pain in joint, shoulder region 06/04/2015   Current Status: Since her last office visit, she is doing well with no complaints. She continues to have c/o chronic joint pain, which she is taking OTC pain medications to aid in relief of symptoms. She recently had cataract surgery 02/2020 and 03/2020. She continues to have chronic foot issues. She currently taking Vitamin B-12 injections every 30 days. Her last injection was on 04/20/2020. She denies fevers, chills, fatigue, recent infections, weight loss, and night sweats. She has not had any headaches, visual changes, dizziness, and falls. No chest pain, heart palpitations, cough and shortness of breath reported. Denies GI problems such as nausea, vomiting, diarrhea, and constipation. She has no reports of blood in stools, dysuria and hematuria. No depression or anxiety reported today. She is taking all medications as prescribed. She denies pain today. She is accompanied by her son today.   Past Medical History:  Diagnosis Date  . Abnormal mammogram of left breast 08/31/2016   . Arthritis    knee  . Atypical ductal hyperplasia of left breast   . Bilateral lower extremity edema 04/2019  . Bilateral lower extremity edema 04/2019  . Breast mass, left   . GERD (gastroesophageal reflux disease)   . Glaucoma   . Hyperlipidemia   . Hypertension   . Other skin changes 04/2019  . SVD (spontaneous vaginal delivery)    x 2  . Vitamin B12 deficiency   . Vitamin D deficiency 10/2019    Past Surgical History:  Procedure Laterality Date  . ABDOMINAL HYSTERECTOMY    . BREAST EXCISIONAL BIOPSY Left 2018   benign lumpectomy  . BREAST LUMPECTOMY WITH RADIOACTIVE SEED LOCALIZATION Left 08/31/2016   Procedure: BREAST LUMPECTOMY WITH RADIOACTIVE SEED LOCALIZATION;  Surgeon: Fanny Skates, MD;  Location: [facility];  Service: General;  Laterality: Left;  . CATARACT EXTRACTION, BILATERAL    . CESAREAN SECTION     patient denies this surgery    Family History  Problem Relation Age of Onset  . Vision loss Mother   . Hypertension Mother   . Heart disease Father   . Colon cancer Neg Hx   . Rectal cancer Neg Hx   . Stomach cancer Neg Hx     Social History   Socioeconomic History  . Marital status: Married    Spouse name: Not on file  . Number of children: 2  . Years of education: Not on file  . Highest education level: Not on file  Occupational History  . Not on file  Tobacco Use  . Smoking status: Never Smoker  .  Smokeless tobacco: Never Used  Vaping Use  . Vaping Use: Never used  Substance and Sexual Activity  . Alcohol use: No    Alcohol/week: 0.0 standard drinks  . Drug use: No  . Sexual activity: Not Currently    Partners: Male    Birth control/protection: Post-menopausal    Comment: hysterectomy  Other Topics Concern  . Not on file  Social History Narrative  . Not on file   Social Determinants of Health   Financial Resource Strain: Not on file  Food Insecurity: Not on file  Transportation Needs: Not on file  Physical  Activity: Not on file  Stress: Not on file  Social Connections: Not on file  Intimate Partner Violence: Not on file    Outpatient Medications Prior to Visit  Medication Sig Dispense Refill  . amLODipine (NORVASC) 5 MG tablet Take 1 tablet (5 mg total) by mouth daily. 90 tablet 3  . atorvastatin (LIPITOR) 40 MG tablet TAKE 1 TABLET(40 MG) BY MOUTH DAILY AT 6 PM 90 tablet 1  . Calcium Carbonate-Vitamin D (CALTRATE 600+D PO) Take by mouth.    . Cholecalciferol (VITAMIN D3) 1000 units CAPS Take by mouth.    . diclofenac Sodium (VOLTAREN) 1 % GEL Apply 4 g topically 4 (four) times daily. 100 g 11  . latanoprost (XALATAN) 0.005 % ophthalmic solution Place 1 drop into both eyes daily.    Marland Kitchen omeprazole (PRILOSEC) 40 MG capsule Take 1 capsule (40 mg total) by mouth daily. 90 capsule 2  . tamoxifen (NOLVADEX) 20 MG tablet Take 1 tablet by mouth once daily 90 tablet 0  . timolol (BETIMOL) 0.5 % ophthalmic solution 1 drop once.    . timolol (TIMOPTIC) 0.5 % ophthalmic solution Place 1 drop into both eyes every morning.    . Wheat Dextrin (BENEFIBER) POWD Take by mouth daily.    . AMBULATORY NON FORMULARY MEDICATION Medication Name:  Nitroglycerin ointment 0.125% Use pea size amount three times day per rectum 30 g 0  . AMBULATORY NON FORMULARY MEDICATION Medication Name: Nitroglycerin 0.125% use pea sized amount per rectum  three times a day for 4 weeks 30 g 1  . cyanocobalamin (,VITAMIN B-12,) 1000 MCG/ML injection Inject 1 mL (1,000 mcg total) into the muscle every 30 (thirty) days. (Patient not taking: Reported on 04/27/2020) 1 mL 6  . furosemide (LASIX) 20 MG tablet Take 1 tablet (20 mg total) by mouth daily. As needed for edema. 30 tablet 6  . meclizine (ANTIVERT) 25 MG tablet Take 1 tablet (25 mg total) by mouth 3 (three) times daily as needed for dizziness. 90 tablet 2   No facility-administered medications prior to visit.    No Known Allergies  ROS Review of Systems  Constitutional: Negative.    HENT: Negative.   Eyes: Negative.   Respiratory: Negative.   Cardiovascular: Negative.   Gastrointestinal: Positive for abdominal distention (obese).  Endocrine: Negative.   Genitourinary: Negative.   Musculoskeletal: Positive for arthralgias (generalized joint pain).       Chronic knee and shoulder pain  Skin: Negative.   Allergic/Immunologic: Negative.   Neurological: Positive for dizziness (occasional ) and headaches (occasional).  Hematological: Negative.   Psychiatric/Behavioral: Negative.       Objective:    Physical Exam Vitals and nursing note reviewed.  Constitutional:      Appearance: Normal appearance.  HENT:     Head: Normocephalic and atraumatic.     Nose: Nose normal.     Mouth/Throat:  Mouth: Mucous membranes are moist.     Pharynx: Oropharynx is clear.  Cardiovascular:     Rate and Rhythm: Normal rate and regular rhythm.     Pulses: Normal pulses.     Heart sounds: Normal heart sounds.  Pulmonary:     Effort: Pulmonary effort is normal.     Breath sounds: Normal breath sounds.  Abdominal:     General: Bowel sounds are normal. There is distension (obese).     Palpations: Abdomen is soft.  Musculoskeletal:        General: Normal range of motion.     Cervical back: Normal range of motion and neck supple.  Skin:    General: Skin is warm and dry.  Neurological:     General: No focal deficit present.     Mental Status: She is alert and oriented to person, place, and time.  Psychiatric:        Mood and Affect: Mood normal.        Behavior: Behavior normal.        Thought Content: Thought content normal.        Judgment: Judgment normal.     BP 126/65 (BP Location: Right Arm, Patient Position: Sitting, Cuff Size: Normal)   Pulse 71   Temp 97.7 F (36.5 C) (Temporal)   Ht 5\' 3"  (1.6 m)   Wt 189 lb 6.4 oz (85.9 kg)   LMP  (LMP Unknown)   SpO2 98%   BMI 33.55 kg/m  Wt Readings from Last 3 Encounters:  04/27/20 189 lb 6.4 oz (85.9 kg)   01/09/20 193 lb 4.8 oz (87.7 kg)  10/18/19 195 lb (88.5 kg)     Health Maintenance Due  Topic Date Due  . Hepatitis C Screening  Never done  . COVID-19 Vaccine (1) Never done  . PAP SMEAR-Modifier  Never done    There are no preventive care reminders to display for this patient.  Lab Results  Component Value Date   TSH 1.420 04/26/2019   Lab Results  Component Value Date   WBC 6.8 04/26/2019   HGB 11.4 04/26/2019   HCT 34.1 04/26/2019   MCV 84 04/26/2019   PLT 226 04/26/2019   Lab Results  Component Value Date   NA 141 04/26/2019   K 3.9 04/26/2019   CHLORIDE 110 (H) 01/24/2017   CO2 19 (L) 04/26/2019   GLUCOSE 110 (H) 04/26/2019   BUN 12 04/26/2019   CREATININE 0.61 04/26/2019   BILITOT 0.3 04/26/2019   ALKPHOS 88 04/26/2019   AST 56 (H) 04/26/2019   ALT 28 04/26/2019   PROT 6.5 04/26/2019   ALBUMIN 3.7 (L) 04/26/2019   CALCIUM 9.4 04/26/2019   ANIONGAP 8 06/15/2017   EGFR >60 01/24/2017   Lab Results  Component Value Date   CHOL 104 04/26/2019   Lab Results  Component Value Date   HDL 32 (L) 04/26/2019   Lab Results  Component Value Date   LDLCALC 52 04/26/2019   Lab Results  Component Value Date   TRIG 107 04/26/2019   Lab Results  Component Value Date   CHOLHDL 3.3 04/26/2019   Lab Results  Component Value Date   HGBA1C 5.8 (A) 04/26/2019      Assessment & Plan:   1. Annual physical exam Physical assessment within normal for age.  Follow-up for scheduled mammogram as needed.  Recommend monthly self breast exam Recommend daily multivitamin for women Recommend strength training in 150 minutes of cardiovascular exercise per week  2. Language barrier  3. Vitamin B12 deficiency We will re-assess Vitamin B-12 as needed.   4. History of cataract extraction, unspecified laterality Post-surgery cataract removal. Stable with no complaints. Monitor.   5. Essential hypertension The current medical regimen is effective; blood pressure  is stable at 126/65 today; continue present plan and medications as prescribed. She will continue to take medications as prescribed, to decrease high sodium intake, excessive alcohol intake, increase potassium intake, smoking cessation, and increase physical activity of at least 30 minutes of cardio activity daily. She will continue to follow Heart Healthy or DASH diet.  6. Gastroesophageal reflux disease without esophagitis  7. Hyperlipidemia, unspecified hyperlipidemia type  8. Chronic pain of both shoulders  9. Chronic pain of both knees  10. Plantar fasciitis, right Continue plantar fascitis remedies as needed.   11. Healthcare maintenance - Vitamin B12 - Vitamin D, 25-hydroxy - TSH - Lipid Panel - Comprehensive metabolic panel - CBC with Differential - Folate - Magnesium - Urinalysis  12. Follow up She will follow up in 6 months.   No orders of the defined types were placed in this encounter.   Orders Placed This Encounter  Procedures  . Vitamin B12  . Vitamin D, 25-hydroxy  . TSH  . Lipid Panel  . Comprehensive metabolic panel  . CBC with Differential  . Folate  . Magnesium  . Urinalysis    Referral Orders  No referral(s) requested today    Alexandra Becton, Alexandra Beasley, Alexandra Beasley, Alexandra Beasley [facility] [address] [place], [place] [address] [phone] fax  Problem List Items Addressed This Visit      Cardiovascular and Mediastinum   Essential hypertension     Digestive   Gastroesophageal reflux disease without esophagitis    Other Visit Diagnoses    Language barrier    -  Primary   Annual physical exam       Vitamin B12 deficiency       History of cataract extraction, unspecified laterality       Hyperlipidemia, unspecified hyperlipidemia type       Chronic pain of both shoulders       Chronic pain of both knees       Plantar fasciitis, right        Healthcare maintenance       Relevant Orders   Vitamin B12   Vitamin D, 25-hydroxy   TSH   Lipid Panel   Comprehensive metabolic panel   CBC with Differential   Folate   Magnesium   Urinalysis   Follow up          No orders of the defined types were placed in this encounter.   Follow-up: No follow-ups on file.    Alexandra Glatter, FNP

## 2020-04-28 ENCOUNTER — Telehealth: Payer: Self-pay

## 2020-04-28 LAB — CBC WITH DIFFERENTIAL/PLATELET
Basophils Absolute: 0.1 10*3/uL (ref 0.0–0.2)
Basos: 1 %
EOS (ABSOLUTE): 0.2 10*3/uL (ref 0.0–0.4)
Eos: 2 %
Hematocrit: 34.9 % (ref 34.0–46.6)
Hemoglobin: 11.8 g/dL (ref 11.1–15.9)
Immature Grans (Abs): 0 10*3/uL (ref 0.0–0.1)
Immature Granulocytes: 0 %
Lymphocytes Absolute: 1.7 10*3/uL (ref 0.7–3.1)
Lymphs: 20 %
MCH: 28.1 pg (ref 26.6–33.0)
MCHC: 33.8 g/dL (ref 31.5–35.7)
MCV: 83 fL (ref 79–97)
Monocytes Absolute: 0.6 10*3/uL (ref 0.1–0.9)
Monocytes: 7 %
Neutrophils Absolute: 6.2 10*3/uL (ref 1.4–7.0)
Neutrophils: 70 %
Platelets: 264 10*3/uL (ref 150–450)
RBC: 4.2 x10E6/uL (ref 3.77–5.28)
RDW: 13.6 % (ref 11.7–15.4)
WBC: 8.7 10*3/uL (ref 3.4–10.8)

## 2020-04-28 LAB — LIPID PANEL
Chol/HDL Ratio: 3 ratio (ref 0.0–4.4)
Cholesterol, Total: 121 mg/dL (ref 100–199)
HDL: 40 mg/dL (ref >39)
LDL Chol Calc (NIH): 66 mg/dL (ref 0–99)
Triglycerides: 75 mg/dL (ref 0–149)
VLDL Cholesterol Cal: 15 mg/dL (ref 5–40)

## 2020-04-28 LAB — COMPREHENSIVE METABOLIC PANEL
ALT: 28 IU/L (ref 0–32)
AST: 43 IU/L — ABNORMAL HIGH (ref 0–40)
Albumin/Globulin Ratio: 1.1 — ABNORMAL LOW (ref 1.2–2.2)
Albumin: 4 g/dL (ref 3.8–4.9)
Alkaline Phosphatase: 95 IU/L (ref 44–121)
BUN/Creatinine Ratio: 14 (ref 9–23)
BUN: 10 mg/dL (ref 6–24)
Bilirubin Total: 0.3 mg/dL (ref 0.0–1.2)
CO2: 20 mmol/L (ref 20–29)
Calcium: 9.6 mg/dL (ref 8.7–10.2)
Chloride: 105 mmol/L (ref 96–106)
Creatinine, Ser: 0.71 mg/dL (ref 0.57–1.00)
GFR calc Af Amer: 110 mL/min/{1.73_m2} (ref >59)
GFR calc non Af Amer: 96 mL/min/{1.73_m2} (ref >59)
Globulin, Total: 3.5 g/dL (ref 1.5–4.5)
Glucose: 99 mg/dL (ref 65–99)
Potassium: 4.2 mmol/L (ref 3.5–5.2)
Sodium: 142 mmol/L (ref 134–144)
Total Protein: 7.5 g/dL (ref 6.0–8.5)

## 2020-04-28 LAB — URINALYSIS
Bilirubin, UA: NEGATIVE
Glucose, UA: NEGATIVE
Ketones, UA: NEGATIVE
Leukocytes,UA: NEGATIVE
Nitrite, UA: NEGATIVE
Protein,UA: NEGATIVE
RBC, UA: NEGATIVE
Specific Gravity, UA: 1.018 (ref 1.005–1.030)
Urobilinogen, Ur: 0.2 mg/dL (ref 0.2–1.0)
pH, UA: 5.5 (ref 5.0–7.5)

## 2020-04-28 LAB — TSH: TSH: 1.74 u[IU]/mL (ref 0.450–4.500)

## 2020-04-28 LAB — FOLATE: Folate: 9.3 ng/mL (ref >3.0)

## 2020-04-28 LAB — VITAMIN D 25 HYDROXY (VIT D DEFICIENCY, FRACTURES): Vit D, 25-Hydroxy: 60.9 ng/mL (ref 30.0–100.0)

## 2020-04-28 LAB — MAGNESIUM: Magnesium: 2.1 mg/dL (ref 1.6–2.3)

## 2020-04-28 LAB — VITAMIN B12: Vitamin B-12: >2000 pg/mL — ABNORMAL HIGH (ref 232–1245)

## 2020-04-28 NOTE — Telephone Encounter (Signed)
-----   Message from Azzie Glatter, FNP sent at 04/28/2020 10:58 AM EST ----- #Language barrier# Please inform patient's son that all labs are stable. Thank you.

## 2020-04-28 NOTE — Telephone Encounter (Signed)
Informed patients daughter of lab results

## 2020-05-08 ENCOUNTER — Telehealth: Payer: Self-pay

## 2020-05-08 NOTE — Telephone Encounter (Signed)
[employer] Medicaid Recertification faxed to [employer].

## 2020-05-18 ENCOUNTER — Inpatient Hospital Stay: Payer: Medicaid Other | Attending: Oncology

## 2020-06-04 ENCOUNTER — Other Ambulatory Visit: Payer: Self-pay | Admitting: Oncology

## 2020-06-15 ENCOUNTER — Inpatient Hospital Stay: Payer: Medicaid Other | Attending: Oncology

## 2020-07-07 ENCOUNTER — Other Ambulatory Visit: Payer: Self-pay | Admitting: Family Medicine

## 2020-07-07 DIAGNOSIS — E785 Hyperlipidemia, unspecified: Secondary | ICD-10-CM

## 2020-07-13 ENCOUNTER — Inpatient Hospital Stay: Payer: Medicaid Other | Attending: Oncology

## 2020-07-20 ENCOUNTER — Other Ambulatory Visit: Payer: Self-pay | Admitting: Oncology

## 2020-07-20 DIAGNOSIS — Z1231 Encounter for screening mammogram for malignant neoplasm of breast: Secondary | ICD-10-CM

## 2020-08-10 ENCOUNTER — Inpatient Hospital Stay: Payer: Medicaid Other | Attending: Oncology

## 2020-08-12 ENCOUNTER — Telehealth: Payer: Self-pay | Admitting: Adult Health

## 2020-08-12 NOTE — Telephone Encounter (Signed)
Rescheduled appointment per provider. Patient will receive calender.

## 2020-08-27 ENCOUNTER — Other Ambulatory Visit: Payer: Self-pay | Admitting: Oncology

## 2020-09-07 ENCOUNTER — Other Ambulatory Visit: Payer: Self-pay | Admitting: Oncology

## 2020-09-07 ENCOUNTER — Telehealth: Payer: Self-pay

## 2020-09-07 ENCOUNTER — Ambulatory Visit: Payer: Medicaid Other

## 2020-09-07 NOTE — Telephone Encounter (Signed)
This LPN called pt's daughter, Binni concerning B12 inj she was late for today. Pt states she did not realize today was 6/20 and she completely forgot. Sent secure chat to [facility] in scheduling to change today's inj appt to 6/22 at 1000 as well has her current 7/18 inj appt to 7/21 after her visit with Mendel Ryder. Daughter verbalized agreement and understanding.

## 2020-09-09 ENCOUNTER — Inpatient Hospital Stay: Payer: Medicaid Other | Attending: Oncology

## 2020-09-09 ENCOUNTER — Other Ambulatory Visit: Payer: Self-pay | Admitting: *Deleted

## 2020-09-09 ENCOUNTER — Other Ambulatory Visit: Payer: Self-pay

## 2020-09-09 DIAGNOSIS — E538 Deficiency of other specified B group vitamins: Secondary | ICD-10-CM | POA: Insufficient documentation

## 2020-09-09 DIAGNOSIS — Z79899 Other long term (current) drug therapy: Secondary | ICD-10-CM | POA: Diagnosis not present

## 2020-09-09 DIAGNOSIS — N6092 Unspecified benign mammary dysplasia of left breast: Secondary | ICD-10-CM | POA: Diagnosis present

## 2020-09-09 MED ORDER — CYANOCOBALAMIN 1000 MCG/ML IJ SOLN
1000.0000 ug | Freq: Once | INTRAMUSCULAR | Status: AC
  Administered 2020-09-09: 1000 ug via INTRAMUSCULAR

## 2020-09-09 MED ORDER — TAMOXIFEN CITRATE 20 MG PO TABS: 20.0000 mg | ORAL_TABLET | Freq: Every day | ORAL | 0 refills | Status: DC

## 2020-09-09 MED ORDER — CYANOCOBALAMIN 1000 MCG/ML IJ SOLN
INTRAMUSCULAR | Status: AC
  Filled 2020-09-09: qty 1

## 2020-09-15 ENCOUNTER — Ambulatory Visit
Admission: RE | Admit: 2020-09-15 | Discharge: 2020-09-15 | Disposition: A | Discharge status: Home / Self Care | Payer: Medicaid Other | Source: Ambulatory Visit | Attending: Oncology | Admitting: Oncology

## 2020-09-15 ENCOUNTER — Other Ambulatory Visit: Payer: Self-pay

## 2020-09-15 DIAGNOSIS — Z1231 Encounter for screening mammogram for malignant neoplasm of breast: Secondary | ICD-10-CM

## 2020-10-01 NOTE — Progress Notes (Signed)
[place]  [phone] [phone]     ID: Alexandra Beasley DOB: [birth year]  MR#: [id number]  HER#:[id number]  Patient Care Team: Alexandra Francois, NP as PCP - General (Adult Health Nurse Practitioner) Alexandra Pole, MD as Consulting Physician (Gastroenterology) OTHER MD:  CHIEF COMPLAINT: Atypical lobular hyperplasia  CURRENT TREATMENT: Tamoxifen; B12 supplementation   INTERVAL HISTORY: Alexandra Beasley returns today for follow-up of her history of atypical lobular hyperplasia, accompanied by her son Alexandra Beasley to translate.   Alexandra Beasley continues on tamoxifen.  She tolerates this well with no side effects that she is aware of.  Her most recent mammogram was completed on 09/15/2020 and showed no mammographic evidence of malignancy and breast density category A.  She is due for repeat mammogram in one year.    She walks daily for about 20-30 minutes daily.  She sees her PCP regularly.  She is s/p TAH and says she does not have a cervix anymore.  I attempted to update a couple of other elements of her health maintenance, however, she cannot recall specific dates.      REVIEW OF SYSTEMS: Review of Systems  Constitutional:  Negative for appetite change, chills, fatigue, fever and unexpected weight change.  HENT:   Negative for hearing loss, lump/mass and trouble swallowing.   Eyes:  Negative for eye problems and icterus.  Respiratory:  Negative for chest tightness, cough and shortness of breath.   Cardiovascular:  Negative for chest pain, leg swelling and palpitations.  Gastrointestinal:  Negative for abdominal distention, abdominal pain, constipation, diarrhea, nausea and vomiting.  Endocrine: Negative for hot flashes.  Genitourinary:  Negative for difficulty urinating.   Musculoskeletal:  Negative for arthralgias.  Skin:  Negative for itching and rash.  Neurological:  Negative for dizziness, extremity weakness, headaches and numbness.  Hematological:   Negative for adenopathy. Does not bruise/bleed easily.  Psychiatric/Behavioral:  Negative for depression. The patient is not nervous/anxious.      HISTORY OF CURRENT ILLNESS: From the original intake note:  "Alexandra Beasley" [[id number]] had bilateral screening mammography for 12/08/2016 at the [facility]. There was a possible distortion in the left breast, and on 07/21/2016 she'll she underwent left diagnostic mammography with tomography and left breast ultrasonography. The breast density was category B. This confirmed an area of distortion in the lower inner left breast which was not palpable. Ultrasonography found no definite correlate. The axilla was negative.  Biopsy of this area may 07/08/2016 showed ([id number]) a complex sclerosing lesion. Accordingly the patient was referred to surgery and on 08/31/2016 underwent left lumpectomy, which showed atypical lobular hyperplasia ([id number]). She was then referred to the high risk clinic. There has been some delay because of [facility] approval issues  The patient's subsequent history is as detailed below.   PAST MEDICAL HISTORY: Past Medical History:  Diagnosis Date   Abnormal mammogram of left breast 08/31/2016   Arthritis    knee   Atypical ductal hyperplasia of left breast    Bilateral lower extremity edema 04/2019   Bilateral lower extremity edema 04/2019   Breast mass, left    GERD (gastroesophageal reflux disease)    Glaucoma    Hyperlipidemia    Hypertension    Other skin changes 04/2019   SVD (spontaneous vaginal delivery)    x 2   Vitamin B12 deficiency    Vitamin D deficiency 10/2019    PAST SURGICAL HISTORY: Past Surgical History:  Procedure Laterality Date   ABDOMINAL HYSTERECTOMY  BREAST EXCISIONAL BIOPSY Left 2018   benign lumpectomy   BREAST LUMPECTOMY WITH RADIOACTIVE SEED LOCALIZATION Left 08/31/2016   Procedure: BREAST LUMPECTOMY WITH RADIOACTIVE SEED LOCALIZATION;  Surgeon: Alexandra Skates, MD;  Location: [facility];  Service: General;  Laterality: Left;   CATARACT EXTRACTION, BILATERAL     CESAREAN SECTION     patient denies this surgery    FAMILY HISTORY Family History  Problem Relation Age of Onset   Vision loss Mother    Hypertension Mother    Heart disease Father    Colon cancer Neg Hx    Rectal cancer Neg Hx    Stomach cancer Neg Hx   The patient's father is 32 years old and her mother 76 years old as of August 2018. The patient has one brother and one sister. There is no history of breast or ovarian cancer in the family.   GYNECOLOGIC HISTORY:  No LMP recorded (lmp unknown). Patient has had a hysterectomy. Menarche age 10, first live birth age 61, the patient is [place] P2. She is status post remote hysterectomy, without salpingo-oophorectomy   SOCIAL HISTORY:  She is originally from [place]. She is married, her husband has a history of head and neck cancer and is disabled as a result. He is followed at [facility]. The patient's daughter Alexandra Beasley owns 6 convenience stores. The patient's son Alexandra Beasley is studying pharmacy at [employer]. The patient has one granddaughter. She attends a local [employer]    ADVANCED DIRECTIVES: In the absence of any documentation to the contrary, the patient's spouse is their HCPOA.   HEALTH MAINTENANCE: Social History   Tobacco Use   Smoking status: Never   Smokeless tobacco: Never  Vaping Use   Vaping Use: Never used  Substance Use Topics   Alcohol use: No    Alcohol/week: 0.0 standard drinks   Drug use: No     Colonoscopy: Overdue  PAP: Status post hysterectomy  Bone density: 07/27/2017 showed a T score of -1.8 osteopenia   No Known Allergies  Current Outpatient Medications  Medication Sig Dispense Refill   amLODipine (NORVASC) 5 MG tablet Take 1 tablet (5 mg total) by mouth daily. 90 tablet 3   atorvastatin (LIPITOR) 40 MG tablet TAKE 1 TABLET(40 MG) BY MOUTH DAILY AT 6 PM 90 tablet 1   Calcium Carbonate-Vitamin D (CALTRATE 600+D  PO) Take by mouth.     Cholecalciferol (VITAMIN D3) 1000 units CAPS Take by mouth.     diclofenac Sodium (VOLTAREN) 1 % GEL Apply 4 g topically 4 (four) times daily. 100 g 11   latanoprost (XALATAN) 0.005 % ophthalmic solution Place 1 drop into both eyes daily.     omeprazole (PRILOSEC) 40 MG capsule Take 1 capsule (40 mg total) by mouth daily. 90 capsule 2   tamoxifen (NOLVADEX) 20 MG tablet Take 1 tablet (20 mg total) by mouth daily. 90 tablet 0   timolol (BETIMOL) 0.5 % ophthalmic solution 1 drop once.     timolol (TIMOPTIC) 0.5 % ophthalmic solution Place 1 drop into both eyes every morning.     Wheat Dextrin (BENEFIBER) POWD Take by mouth daily.     No current facility-administered medications for this visit.    OBJECTIVE:  Vitals:   10/08/20 1001  BP: 127/74  Pulse: 89  Resp: 18  Temp: (!) 97.5 F (36.4 C)  SpO2: 100%      Body mass index is 34.68 kg/m.   Wt Readings from Last 3 Encounters:  10/08/20 195 lb 12.8 oz (88.8 kg)  04/27/20 189 lb 6.4 oz (85.9 kg)  01/09/20 193 lb 4.8 oz (87.7 kg)      ECOG FS:1 - Symptomatic but completely ambulatory GENERAL: Patient is a well appearing female in no acute distress HEENT:  Sclerae anicteric.  Mask in place.  Neck is supple.  NODES:  No cervical, supraclavicular, or axillary lymphadenopathy palpated.  BREAST EXAM: left breast s/p lumpectomy, no sign of mass or lesion, right breast benign LUNGS:  Clear to auscultation bilaterally.  No wheezes or rhonchi. HEART:  Regular rate and rhythm. No murmur appreciated. ABDOMEN:  Soft, nontender.  Positive, normoactive bowel sounds. No organomegaly palpated. MSK:  No focal spinal tenderness to palpation. Full range of motion bilaterally in the upper extremities. EXTREMITIES:  No peripheral edema.   SKIN:  Clear with no obvious rashes or skin changes. No nail dyscrasia. NEURO:  Nonfocal. Well oriented.  Appropriate affect.    LAB RESULTS:  CMP     Component Value Date/Time   NA  143 10/08/2020 0947   NA 142 04/27/2020 1106   NA 141 01/24/2017 1018   K 4.0 10/08/2020 0947   K 4.0 01/24/2017 1018   CL 112 (H) 10/08/2020 0947   CO2 24 10/08/2020 0947   CO2 25 01/24/2017 1018   GLUCOSE 99 10/08/2020 0947   GLUCOSE 94 01/24/2017 1018   BUN 13 10/08/2020 0947   BUN 10 04/27/2020 1106   BUN 9.6 01/24/2017 1018   CREATININE 0.60 10/08/2020 0947   CREATININE 0.7 01/24/2017 1018   CALCIUM 8.8 (L) 10/08/2020 0947   CALCIUM 9.7 01/24/2017 1018   PROT 6.9 10/08/2020 0947   PROT 7.5 04/27/2020 1106   PROT 7.5 01/24/2017 1018   ALBUMIN 3.5 10/08/2020 0947   ALBUMIN 4.0 04/27/2020 1106   ALBUMIN 3.6 01/24/2017 1018   AST 46 (H) 10/08/2020 0947   AST 26 01/24/2017 1018   ALT 30 10/08/2020 0947   ALT 26 01/24/2017 1018   ALKPHOS 67 10/08/2020 0947   ALKPHOS 87 01/24/2017 1018   BILITOT 0.6 10/08/2020 0947   BILITOT 0.40 01/24/2017 1018   GFRNONAA >60 10/08/2020 0947   GFRNONAA >89 07/04/2016 1115   GFRAA 110 04/27/2020 1106   GFRAA >89 07/04/2016 1115    Lab Results  Component Value Date   WBC 8.1 10/08/2020   NEUTROABS 5.5 10/08/2020   HGB 10.6 (L) 10/08/2020   HCT 33.6 (L) 10/08/2020   MCV 83.4 10/08/2020   PLT 219 10/08/2020      Chemistry      Component Value Date/Time   NA 143 10/08/2020 0947   NA 142 04/27/2020 1106   NA 141 01/24/2017 1018   K 4.0 10/08/2020 0947   K 4.0 01/24/2017 1018   CL 112 (H) 10/08/2020 0947   CO2 24 10/08/2020 0947   CO2 25 01/24/2017 1018   BUN 13 10/08/2020 0947   BUN 10 04/27/2020 1106   BUN 9.6 01/24/2017 1018   CREATININE 0.60 10/08/2020 0947   CREATININE 0.7 01/24/2017 1018      Component Value Date/Time   CALCIUM 8.8 (L) 10/08/2020 0947   CALCIUM 9.7 01/24/2017 1018   ALKPHOS 67 10/08/2020 0947   ALKPHOS 87 01/24/2017 1018   AST 46 (H) 10/08/2020 0947   AST 26 01/24/2017 1018   ALT 30 10/08/2020 0947   ALT 26 01/24/2017 1018   BILITOT 0.6 10/08/2020 0947   BILITOT 0.40 01/24/2017 1018  No results found for: LABCA2  No components found for: DGUYQI347  No results for input(s): INR in the last 168 hours.  No results found for: LABCA2  No results found for: CAN199  No results found for: QQV956  No results found for: LOV564  No results found for: CA2729  No components found for: HGQUANT  No results found for: CEA1 / No results found for: CEA1   No results found for: AFPTUMOR  No results found for: [facility]  Appointment on 10/08/2020  Component Date Value Ref Range Status   Folate 10/08/2020 12.5  >5.9 ng/mL Final   Performed at [facility], [place] [address], [place], [place] [address]   Vitamin B-12 10/08/2020 3,079 (A) 180 - 914 pg/mL Final   Comment: RESULTS CONFIRMED BY MANUAL DILUTION (NOTE) This assay is not validated for testing neonatal or myeloproliferative syndrome specimens for Vitamin B12 levels. Performed at [facility], [place] [address] [place], [place] [address]    WBC 10/08/2020 8.1  4.0 - 10.5 K/uL Final   RBC 10/08/2020 4.03  3.87 - 5.11 MIL/uL Final   Hemoglobin 10/08/2020 10.6 (A) 12.0 - 15.0 g/dL Final   HCT 10/08/2020 33.6 (A) 36.0 - 46.0 % Final   MCV 10/08/2020 83.4  80.0 - 100.0 fL Final   MCH 10/08/2020 26.3  26.0 - 34.0 pg Final   MCHC 10/08/2020 31.5  30.0 - 36.0 g/dL Final   RDW 10/08/2020 14.9  11.5 - 15.5 % Final   Platelets 10/08/2020 219  150 - 400 K/uL Final   nRBC 10/08/2020 0.0  0.0 - 0.2 % Final   Neutrophils Relative % 10/08/2020 67  % Final   Neutro Abs 10/08/2020 5.5  1.7 - 7.7 K/uL Final   Lymphocytes Relative 10/08/2020 20  % Final   Lymphs Abs 10/08/2020 1.6  0.7 - 4.0 K/uL Final   Monocytes Relative 10/08/2020 9  % Final   Monocytes Absolute 10/08/2020 0.7  0.1 - 1.0 K/uL Final   Eosinophils Relative 10/08/2020 2  % Final   Eosinophils Absolute 10/08/2020 0.2  0.0 - 0.5 K/uL Final   Basophils Relative 10/08/2020 1  % Final   Basophils Absolute 10/08/2020 0.1   0.0 - 0.1 K/uL Final   Immature Granulocytes 10/08/2020 1  % Final   Abs Immature Granulocytes 10/08/2020 0.04  0.00 - 0.07 K/uL Final   Performed at [facility], [place] [address] [place], [place] [address]   Sodium 10/08/2020 143  135 - 145 mmol/L Final   Potassium 10/08/2020 4.0  3.5 - 5.1 mmol/L Final   Chloride 10/08/2020 112 (A) 98 - 111 mmol/L Final   CO2 10/08/2020 24  22 - 32 mmol/L Final   Glucose, Bld 10/08/2020 99  70 - 99 mg/dL Final   Glucose reference range applies only to samples taken after fasting for at least 8 hours.   BUN 10/08/2020 13  6 - 20 mg/dL Final   Creatinine 10/08/2020 0.60  0.44 - 1.00 mg/dL Final   Calcium 10/08/2020 8.8 (A) 8.9 - 10.3 mg/dL Final   Total Protein 10/08/2020 6.9  6.5 - 8.1 g/dL Final   Albumin 10/08/2020 3.5  3.5 - 5.0 g/dL Final   AST 10/08/2020 46 (A) 15 - 41 U/L Final   ALT 10/08/2020 30  0 - 44 U/L Final   Alkaline Phosphatase 10/08/2020 67  38 - 126 U/L Final   Total Bilirubin 10/08/2020 0.6  0.3 - 1.2 mg/dL Final  GFR, Estimated 10/08/2020 >60  >60 mL/min Final   Comment: (NOTE) Calculated using the CKD-EPI Creatinine Equation (2021)    Anion gap 10/08/2020 7  5 - 15 Final   Performed at [facility], [place] [address] [place], [place] [address]    (this displays the last labs from the last 3 days)  No results found for: TOTALPROTELP, ALBUMINELP, A1GS, A2GS, BETS, BETA2SER, GAMS, MSPIKE, SPEI (this displays SPEP labs)  No results found for: KPAFRELGTCHN, LAMBDASER, KAPLAMBRATIO (kappa/lambda light chains)  No results found for: HGBA, HGBA2QUANT, HGBFQUANT, HGBSQUAN (Hemoglobinopathy evaluation)   No results found for: LDH  No results found for: IRON, TIBC, IRONPCTSAT (Iron and TIBC)  No results found for: FERRITIN  Urinalysis    Component Value Date/Time   APPEARANCEUR Clear 04/27/2020 1103   LABSPEC >=1.030 05/12/2017 0942   PHURINE 5.5 05/12/2017 0942   GLUCOSEU  Negative 04/27/2020 [place] 05/12/2017 0942   BILIRUBINUR Negative 04/27/2020 1103   KETONESUR negative 10/24/2018 0915   KETONESUR NEGATIVE 05/12/2017 0942   PROTEINUR Negative 04/27/2020 1103   PROTEINUR NEGATIVE 05/12/2017 0942   UROBILINOGEN 0.2 04/26/2019 1100   UROBILINOGEN 0.2 05/12/2017 0942   NITRITE Negative 04/27/2020 1103   NITRITE NEGATIVE 05/12/2017 0942   LEUKOCYTESUR Negative 04/27/2020 1103    STUDIES: MM 3D SCREEN BREAST BILATERAL  Result Date: 09/16/2020 CLINICAL DATA:  Screening. EXAM: DIGITAL SCREENING BILATERAL MAMMOGRAM WITH TOMOSYNTHESIS AND CAD TECHNIQUE: Bilateral screening digital craniocaudal and mediolateral oblique mammograms were obtained. Bilateral screening digital breast tomosynthesis was performed. The images were evaluated with computer-aided detection. COMPARISON:  Previous exam(s). ACR Breast Density Category a: The breast tissue is almost entirely fatty. FINDINGS: There are no findings suspicious for malignancy. IMPRESSION: No mammographic evidence of malignancy. A result letter of this screening mammogram will be mailed directly to the patient. RECOMMENDATION: Screening mammogram in one year. (Code:[id number]-01Y) BI-RADS CATEGORY  1: Negative. Electronically Signed   By: Lajean Manes M.D.   On: 09/16/2020 13:22     ASSESSMENT: 55 y.o. [place] woman status post left breast lumpectomy 08/31/2016 for atypical lobular hyperplasia  (1) anastrozole started 11/07/2016, discontinued February 07, 2017 with poor tolerance  (2) started tamoxifen 03/21/17  (a) status post remote hysterectomy, without salpingo-oophorectomy  (3) Osteopenia: bone density 07/27/2017 showed a T score of -1.8   (4) B12 deficiency: On monthly supplementation   PLAN: Alexandra Beasley is here today for f/u of her [place] on tamoxifen therapy.  She is doing well.  I have no concern for bresat cancer in her today.  She continues to undergo a mammogram every year, and her most recent  one showed no evidence of malignancy.  She will continuye to undergo annual mammogram.    She continues to receive monthly b 12, the pain in her feet fromt he deficiency has resolved.   We discussed that she will continue with her monthly injections.  I reviewed her helath maintenance, and I sent her son a link to get her signed up for my chart.  Once he does that I showed him how to upload a picture of her COVID 19 vaccination card in a message to send to me and then I can update her health maintenance.    Evalene will return every 4 weeks for injection, and in 6 months for labs and f/u.  She knows to call for any questions that may arise between now and her next appointment.  We are happy to see her sooner if needed.  Total encounter time 25 minutes.* in chart review, lab review, face to face visit time, order entry, care coordination, and documentation of the encounter.   Wilber Bihari, NP 10/08/20 12:50 PM Medical Oncology and Hematology [facility] [place], [place] [address] Tel. [phone]    Fax. [phone]   *Total Encounter Time as defined by the Centers for Medicare and Medicaid Services includes, in addition to the face-to-face time of a patient visit (documented in the note above) non-face-to-face time: obtaining and reviewing outside history, ordering and reviewing medications, tests or procedures, care coordination (communications with other health care professionals or caregivers) and documentation in the medical record.

## 2020-10-05 ENCOUNTER — Ambulatory Visit: Payer: Medicaid Other

## 2020-10-07 ENCOUNTER — Other Ambulatory Visit: Payer: Medicaid Other

## 2020-10-07 ENCOUNTER — Ambulatory Visit: Payer: Medicaid Other | Admitting: Adult Health

## 2020-10-08 ENCOUNTER — Other Ambulatory Visit: Payer: Self-pay

## 2020-10-08 ENCOUNTER — Inpatient Hospital Stay: Payer: Medicaid Other | Attending: Oncology

## 2020-10-08 ENCOUNTER — Inpatient Hospital Stay (HOSPITAL_BASED_OUTPATIENT_CLINIC_OR_DEPARTMENT_OTHER): Payer: Medicaid Other | Admitting: Adult Health

## 2020-10-08 ENCOUNTER — Encounter: Payer: Self-pay | Admitting: Adult Health

## 2020-10-08 ENCOUNTER — Inpatient Hospital Stay: Payer: Medicaid Other

## 2020-10-08 VITALS — BP 127/74 | HR 89 | Temp 97.5°F | Resp 18 | Ht 63.0 in | Wt 195.8 lb

## 2020-10-08 DIAGNOSIS — I10 Essential (primary) hypertension: Secondary | ICD-10-CM | POA: Diagnosis not present

## 2020-10-08 DIAGNOSIS — M8580 Other specified disorders of bone density and structure, unspecified site: Secondary | ICD-10-CM | POA: Insufficient documentation

## 2020-10-08 DIAGNOSIS — E538 Deficiency of other specified B group vitamins: Secondary | ICD-10-CM

## 2020-10-08 DIAGNOSIS — E785 Hyperlipidemia, unspecified: Secondary | ICD-10-CM | POA: Diagnosis not present

## 2020-10-08 DIAGNOSIS — K219 Gastro-esophageal reflux disease without esophagitis: Secondary | ICD-10-CM | POA: Diagnosis not present

## 2020-10-08 DIAGNOSIS — N6092 Unspecified benign mammary dysplasia of left breast: Secondary | ICD-10-CM | POA: Insufficient documentation

## 2020-10-08 DIAGNOSIS — Z79810 Long term (current) use of selective estrogen receptor modulators (SERMs): Secondary | ICD-10-CM | POA: Insufficient documentation

## 2020-10-08 DIAGNOSIS — Z79899 Other long term (current) drug therapy: Secondary | ICD-10-CM | POA: Insufficient documentation

## 2020-10-08 DIAGNOSIS — E559 Vitamin D deficiency, unspecified: Secondary | ICD-10-CM | POA: Insufficient documentation

## 2020-10-08 DIAGNOSIS — Z780 Asymptomatic menopausal state: Secondary | ICD-10-CM

## 2020-10-08 LAB — CMP (CANCER CENTER ONLY)
ALT: 30 U/L (ref 0–44)
AST: 46 U/L — ABNORMAL HIGH (ref 15–41)
Albumin: 3.5 g/dL (ref 3.5–5.0)
Alkaline Phosphatase: 67 U/L (ref 38–126)
Anion gap: 7 (ref 5–15)
BUN: 13 mg/dL (ref 6–20)
CO2: 24 mmol/L (ref 22–32)
Calcium: 8.8 mg/dL — ABNORMAL LOW (ref 8.9–10.3)
Chloride: 112 mmol/L — ABNORMAL HIGH (ref 98–111)
Creatinine: 0.6 mg/dL (ref 0.44–1.00)
GFR, Estimated: >60 mL/min (ref >60)
Glucose, Bld: 99 mg/dL (ref 70–99)
Potassium: 4 mmol/L (ref 3.5–5.1)
Sodium: 143 mmol/L (ref 135–145)
Total Bilirubin: 0.6 mg/dL (ref 0.3–1.2)
Total Protein: 6.9 g/dL (ref 6.5–8.1)

## 2020-10-08 LAB — CBC WITH DIFFERENTIAL/PLATELET
Abs Immature Granulocytes: 0.04 10*3/uL (ref 0.00–0.07)
Basophils Absolute: 0.1 10*3/uL (ref 0.0–0.1)
Basophils Relative: 1 %
Eosinophils Absolute: 0.2 10*3/uL (ref 0.0–0.5)
Eosinophils Relative: 2 %
HCT: 33.6 % — ABNORMAL LOW (ref 36.0–46.0)
Hemoglobin: 10.6 g/dL — ABNORMAL LOW (ref 12.0–15.0)
Immature Granulocytes: 1 %
Lymphocytes Relative: 20 %
Lymphs Abs: 1.6 10*3/uL (ref 0.7–4.0)
MCH: 26.3 pg (ref 26.0–34.0)
MCHC: 31.5 g/dL (ref 30.0–36.0)
MCV: 83.4 fL (ref 80.0–100.0)
Monocytes Absolute: 0.7 10*3/uL (ref 0.1–1.0)
Monocytes Relative: 9 %
Neutro Abs: 5.5 10*3/uL (ref 1.7–7.7)
Neutrophils Relative %: 67 %
Platelets: 219 10*3/uL (ref 150–400)
RBC: 4.03 MIL/uL (ref 3.87–5.11)
RDW: 14.9 % (ref 11.5–15.5)
WBC: 8.1 10*3/uL (ref 4.0–10.5)
nRBC: 0 % (ref 0.0–0.2)

## 2020-10-08 LAB — VITAMIN B12: Vitamin B-12: 3079 pg/mL — ABNORMAL HIGH (ref 180–914)

## 2020-10-08 LAB — FOLATE: Folate: 12.5 ng/mL (ref >5.9)

## 2020-10-08 MED ORDER — CYANOCOBALAMIN 1000 MCG/ML IJ SOLN
1000.0000 ug | Freq: Once | INTRAMUSCULAR | Status: AC
  Administered 2020-10-08: 1000 ug via INTRAMUSCULAR

## 2020-10-08 MED ORDER — CYANOCOBALAMIN 1000 MCG/ML IJ SOLN
INTRAMUSCULAR | Status: AC
  Filled 2020-10-08: qty 1

## 2020-10-09 ENCOUNTER — Encounter: Payer: Self-pay | Admitting: Adult Health

## 2020-10-12 ENCOUNTER — Telehealth: Payer: Self-pay | Admitting: Adult Health

## 2020-10-12 NOTE — Telephone Encounter (Signed)
Scheduled per 7/21 los. Pt will receive an updated appt calendar per next visit appt notes

## 2020-10-23 ENCOUNTER — Other Ambulatory Visit: Payer: Self-pay

## 2020-10-23 ENCOUNTER — Ambulatory Visit (INDEPENDENT_AMBULATORY_CARE_PROVIDER_SITE_OTHER): Payer: Medicaid Other | Admitting: Nurse Practitioner

## 2020-10-23 ENCOUNTER — Encounter: Payer: Self-pay | Admitting: Nurse Practitioner

## 2020-10-23 VITALS — BP 114/48 | HR 65 | Temp 97.6°F | Ht 63.0 in | Wt 194.0 lb

## 2020-10-23 DIAGNOSIS — M7731 Calcaneal spur, right foot: Secondary | ICD-10-CM

## 2020-10-23 DIAGNOSIS — I10 Essential (primary) hypertension: Secondary | ICD-10-CM

## 2020-10-23 DIAGNOSIS — Z1159 Encounter for screening for other viral diseases: Secondary | ICD-10-CM | POA: Diagnosis not present

## 2020-10-23 DIAGNOSIS — E785 Hyperlipidemia, unspecified: Secondary | ICD-10-CM

## 2020-10-23 DIAGNOSIS — M722 Plantar fascial fibromatosis: Secondary | ICD-10-CM | POA: Diagnosis not present

## 2020-10-23 NOTE — Patient Instructions (Addendum)
Edema  Edema is when you have too much fluid in your body or under your skin. Edema may make your legs, feet, and ankles swell up. Swelling is also common in looser tissues, like around your eyes. This is a common condition. It gets more common as you get older. There are many possible causes of edema. Eating too much salt (sodium) and being on your feet or sitting for a long time can cause edema in yourlegs, feet, and ankles. Hot weather may make edema worse. Edema is usually painless. Your skin may look swollen or shiny. Follow these instructions at home: Keep the swollen body part raised (elevated) above the level of your heart when you are sitting or lying down. Do not sit still or stand for a long time. Do not wear tight clothes. Do not wear garters on your upper legs. Exercise your legs. This can help the swelling go down. Wear elastic bandages or support stockings as told by your doctor. Eat a low-salt (low-sodium) diet to reduce fluid as told by your doctor. Depending on the cause of your swelling, you may need to limit how much fluid you drink (fluid restriction). Take over-the-counter and prescription medicines only as told by your doctor. Contact a doctor if: Treatment is not working. You have heart, liver, or kidney disease and have symptoms of edema. You have sudden and unexplained weight gain. Get help right away if: You have shortness of breath or chest pain. You cannot breathe when you lie down. You have pain, redness, or warmth in the swollen areas. You have heart, liver, or kidney disease and get edema all of a sudden. You have a fever and your symptoms get worse all of a sudden. Summary Edema is when you have too much fluid in your body or under your skin. Edema may make your legs, feet, and ankles swell up. Swelling is also common in looser tissues, like around your eyes. Raise (elevate) the swollen body part above the level of your heart when you are sitting or lying  down. Follow your doctor's instructions about diet and how much fluid you can drink (fluid restriction). This information is not intended to replace advice given to you by your health care provider. Make sure you discuss any questions you have with your healthcare provider. Document Revised: 01/01/2020 Document Reviewed: 01/01/2020 Elsevier Patient Education  2022 [place] Your Hypertension Hypertension, also called high blood pressure, is when the force of the blood pressing against the walls of the arteries is too strong. Arteries are blood vessels that carry blood from your heart throughout your body. Hypertension forces the heart to work harder to pump blood and may cause the arteries tobecome narrow or stiff. Understanding blood pressure readings Your personal target blood pressure may vary depending on your medical conditions, your age, and other factors. A blood pressure reading includes a higher number over a lower number. Ideally, your blood pressure should be below 120/80. You should know that: The first, or top, number is called the systolic pressure. It is a measure of the pressure in your arteries as your heart beats. The second, or bottom number, is called the diastolic pressure. It is a measure of the pressure in your arteries as the heart relaxes. Blood pressure is classified into four stages. Based on your blood pressure reading, your health care provider may use the following stages to determine what type of treatment you need, if any. Systolic pressure and diastolicpressure are measured in a  unit called mmHg. Normal Systolic pressure: below 123456. Diastolic pressure: below 80. Elevated Systolic pressure: [id number]. Diastolic pressure: below 80. Hypertension stage 1 Systolic pressure: 0000000. Diastolic pressure: [id number]. Hypertension stage 2 Systolic pressure: [id number] or above. Diastolic pressure: 90 or above. How can this condition affect me? Managing your  hypertension is an important responsibility. Over time, hypertension can damage the arteries and decrease blood flow to important parts of the body, including the brain, heart, and kidneys. Having untreated or uncontrolled hypertension can lead to: A heart attack. A stroke. A weakened blood vessel (aneurysm). Heart failure. Kidney damage. Eye damage. Metabolic syndrome. Memory and concentration problems. Vascular dementia. What actions can I take to manage this condition? Hypertension can be managed by making lifestyle changes and possibly by taking medicines. Your health care provider will help you make a plan to bring yourblood pressure within a normal range. Nutrition  Eat a diet that is high in fiber and potassium, and low in salt (sodium), added sugar, and fat. An example eating plan is called the Dietary Approaches to Stop Hypertension (DASH) diet. To eat this way: Eat plenty of fresh fruits and vegetables. Try to fill one-half of your plate at each meal with fruits and vegetables. Eat whole grains, such as whole-wheat pasta, brown rice, or whole-grain bread. Fill about one-fourth of your plate with whole grains. Eat low-fat dairy products. Avoid fatty cuts of meat, processed or cured meats, and poultry with skin. Fill about one-fourth of your plate with lean proteins such as fish, chicken without skin, beans, eggs, and tofu. Avoid pre-made and processed foods. These tend to be higher in sodium, added sugar, and fat. Reduce your daily sodium intake. Most people with hypertension should eat less than 1,500 mg of sodium a day.  Lifestyle  Work with your health care provider to maintain a healthy body weight or to lose weight. Ask what an ideal weight is for you. Get at least 30 minutes of exercise that causes your heart to beat faster (aerobic exercise) most days of the week. Activities may include walking, swimming, or biking. Include exercise to strengthen your muscles (resistance  exercise), such as weight lifting, as part of your weekly exercise routine. Try to do these types of exercises for 30 minutes at least 3 days a week. Do not use any products that contain nicotine or tobacco, such as cigarettes, e-cigarettes, and chewing tobacco. If you need help quitting, ask your health care provider. Control any long-term (chronic) conditions you have, such as high cholesterol or diabetes. Identify your sources of stress and find ways to manage stress. This may include meditation, deep breathing, or making time for fun activities.  Alcohol use Do not drink alcohol if: Your health care provider tells you not to drink. You are pregnant, may be pregnant, or are planning to become pregnant. If you drink alcohol: Limit how much you use to: 0-1 drink a day for women. 0-2 drinks a day for men. Be aware of how much alcohol is in your drink. In the [place] one drink equals one 12 oz bottle of beer (355 mL), one 5 oz glass of wine (148 mL), or one 1 oz glass of hard liquor (44 mL). Medicines Your health care provider may prescribe medicine if lifestyle changes are not enough to get your blood pressure under control and if: Your systolic blood pressure is 130 or higher. Your diastolic blood pressure is 80 or higher. Take medicines only as told by your health care provider. Follow  the directions carefully. Blood pressure medicines must be taken as told by your health care provider. The medicine does not work as well when you skip doses. Skippingdoses also puts you at risk for problems. Monitoring Before you monitor your blood pressure: Do not smoke, drink caffeinated beverages, or exercise within 30 minutes before taking a measurement. Use the bathroom and empty your bladder (urinate). Sit quietly for at least 5 minutes before taking measurements. Monitor your blood pressure at home as told by your health care provider. To do this: Sit with your back straight and supported. Place your  feet flat on the floor. Do not cross your legs. Support your arm on a flat surface, such as a table. Make sure your upper arm is at heart level. Each time you measure, take two or three readings one minute apart and record the results. You may also need to have your blood pressure checked regularly by your healthcare provider. General information Talk with your health care provider about your diet, exercise habits, and other lifestyle factors that may be contributing to hypertension. Review all the medicines you take with your health care provider because there may be side effects or interactions. Keep all visits as told by your health care provider. Your health care provider can help you create and adjust your plan for managing your high blood pressure. Where to find more information National Heart, Lung, and Blood Institute: https://wilson-eaton.com/ American Heart Association: www.heart.org Contact a health care provider if: You think you are having a reaction to medicines you have taken. You have repeated (recurrent) headaches. You feel dizzy. You have swelling in your ankles. You have trouble with your vision. Get help right away if: You develop a severe headache or confusion. You have unusual weakness or numbness, or you feel faint. You have severe pain in your chest or abdomen. You vomit repeatedly. You have trouble breathing. These symptoms may represent a serious problem that is an emergency. Do not wait to see if the symptoms will go away. Get medical help right away. Call your local emergency services (911 in the [place] Do not drive yourself to the hospital. Summary Hypertension is when the force of blood pumping through your arteries is too strong. If this condition is not controlled, it may put you at risk for serious complications. Your personal target blood pressure may vary depending on your medical conditions, your age, and other factors. For most people, a normal blood pressure is  less than 120/80. Hypertension is managed by lifestyle changes, medicines, or both. Lifestyle changes to help manage hypertension include losing weight, eating a healthy, low-sodium diet, exercising more, stopping smoking, and limiting alcohol. This information is not intended to replace advice given to you by your health care provider. Make sure you discuss any questions you have with your healthcare provider. Document Revised: 04/12/2019 Document Reviewed: 02/05/2019 Elsevier Patient Education  2022 [employer].

## 2020-10-23 NOTE — Progress Notes (Signed)
Alexandra Beasley, [place]  [address] Phone:  [phone]   Fax:  [phone]   Established Patient Office Visit  Subjective:  Patient ID: Alexandra Beasley, female    DOB: 1965/10/10  Age: 55 y.o. MRN: [id number]  CC:  Chief Complaint  Patient presents with   Follow-up    Right ankle pain, tylenol with some relief. Requesting singles vaccine.     HPI Alexandra Beasley presents for follow up. She is in with her son. She  has a past medical history of Abnormal mammogram of left breast (08/31/2016), Arthritis, Atypical ductal hyperplasia of left breast, Bilateral lower extremity edema (04/2019), Bilateral lower extremity edema (04/2019), Breast mass, left, GERD (gastroesophageal reflux disease), Glaucoma, Hyperlipidemia, Hypertension, Other skin changes (04/2019), SVD (spontaneous vaginal delivery), Vitamin B12 deficiency, and Vitamin D deficiency (10/2019).    Hypertension Patient is here for follow-up of elevated blood pressure. She is not exercising and is not adherent to a low-salt diet. Blood pressure is not well controlled at home. Cardiac symptoms: lower extremity edema. This has been ongoing for for a while. He Patient denies chest pain, chest pressure/discomfort, claudication, exertional chest pressure/discomfort, fatigue, irregular heart beat, near-syncope, orthopnea, palpitations, and syncope. Cardiovascular risk factors: hypertension, obesity (BMI >= 30 kg/m2), and sedentary lifestyle. Use of agents associated with hypertension: none. She reports that the Tamoxifen may cause some edema also. History of target organ damage: none. Small headache that last only for a short time with no treatment.  Denies dizziness, visual changes, shortness of breath, dyspnea on exertion, chest pain, nausea, vomiting   She is complaining of 8/10; at its worse, right foot heel pain that radiates into ankle. She denies any known injury.  The pain  has been going on for 1 year. Her son provides her with regular massage; this is helpful .She also uses APAP and Voltaren and this provided temporary relief. She admits that about 6 months ago she received an injection into her heel by podiatry. This was painful but last a few months. She does not desire to have this repeat. The notes indicate plantar fascitis and calcaneus spur to heel.  Past Medical History:  Diagnosis Date   Abnormal mammogram of left breast 08/31/2016   Arthritis    knee   Atypical ductal hyperplasia of left breast    Bilateral lower extremity edema 04/2019   Bilateral lower extremity edema 04/2019   Breast mass, left    GERD (gastroesophageal reflux disease)    Glaucoma    Hyperlipidemia    Hypertension    Other skin changes 04/2019   SVD (spontaneous vaginal delivery)    x 2   Vitamin B12 deficiency    Vitamin D deficiency 10/2019    Past Surgical History:  Procedure Laterality Date   ABDOMINAL HYSTERECTOMY     BREAST EXCISIONAL BIOPSY Left 2018   benign lumpectomy   BREAST LUMPECTOMY WITH RADIOACTIVE SEED LOCALIZATION Left 08/31/2016   Procedure: BREAST LUMPECTOMY WITH RADIOACTIVE SEED LOCALIZATION;  Surgeon: Fanny Skates, MD;  Location: [facility];  Service: General;  Laterality: Left;   CATARACT EXTRACTION, BILATERAL     CESAREAN SECTION     patient denies this surgery    Family History  Problem Relation Age of Onset   Vision loss Mother    Hypertension Mother    Heart disease Father    Colon cancer Neg Hx    Rectal cancer Neg Hx    Stomach  cancer Neg Hx     Social History   Socioeconomic History   Marital status: Married    Spouse name: Not on file   Number of children: 2   Years of education: Not on file   Highest education level: Not on file  Occupational History   Not on file  Tobacco Use   Smoking status: Never   Smokeless tobacco: Never  Vaping Use   Vaping Use: Never used  Substance and Sexual Activity    Alcohol use: No    Alcohol/week: 0.0 standard drinks   Drug use: No   Sexual activity: Not Currently    Partners: Male    Birth control/protection: Post-menopausal    Comment: hysterectomy  Other Topics Concern   Not on file  Social History Narrative   Not on file   Social Determinants of Health   Financial Resource Strain: Not on file  Food Insecurity: Not on file  Transportation Needs: Not on file  Physical Activity: Not on file  Stress: Not on file  Social Connections: Not on file  Intimate Partner Violence: Not on file    Outpatient Medications Prior to Visit  Medication Sig Dispense Refill   amLODipine (NORVASC) 5 MG tablet Take 1 tablet (5 mg total) by mouth daily. 90 tablet 3   atorvastatin (LIPITOR) 40 MG tablet TAKE 1 TABLET(40 MG) BY MOUTH DAILY AT 6 PM 90 tablet 1   Calcium Carbonate-Vitamin D (CALTRATE 600+D PO) Take by mouth.     Cholecalciferol (VITAMIN D3) 1000 units CAPS Take by mouth.     diclofenac Sodium (VOLTAREN) 1 % GEL Apply 4 g topically 4 (four) times daily. 100 g 11   latanoprost (XALATAN) 0.005 % ophthalmic solution Place 1 drop into both eyes daily.     omeprazole (PRILOSEC) 40 MG capsule Take 1 capsule (40 mg total) by mouth daily. 90 capsule 2   timolol (BETIMOL) 0.5 % ophthalmic solution 1 drop once.     Wheat Dextrin (BENEFIBER) POWD Take by mouth daily.     tamoxifen (NOLVADEX) 20 MG tablet Take 1 tablet (20 mg total) by mouth daily. 90 tablet 0   vitamin B-12 (CYANOCOBALAMIN) 1000 MCG tablet Take 1,000 mcg by mouth daily.     timolol (TIMOPTIC) 0.5 % ophthalmic solution Place 1 drop into both eyes every morning.     No facility-administered medications prior to visit.    No Known Allergies  ROS Review of Systems    Objective:    Physical Exam Constitutional:      General: She is not in acute distress.    Appearance: She is obese. She is not ill-appearing, toxic-appearing or diaphoretic.  HENT:     Head: Normocephalic and  atraumatic.     Nose: Nose normal.     Mouth/Throat:     Mouth: Mucous membranes are moist.  Cardiovascular:     Rate and Rhythm: Normal rate and regular rhythm.     Pulses: Normal pulses.          Dorsalis pedis pulses are 2+ on the right side and 2+ on the left side.       Posterior tibial pulses are 2+ on the right side and 2+ on the left side.     Heart sounds: Normal heart sounds.  Pulmonary:     Effort: Pulmonary effort is normal.     Breath sounds: Normal breath sounds.  Abdominal:     General: Bowel sounds are normal.     Palpations:  Abdomen is soft.  Musculoskeletal:        General: Normal range of motion.     Cervical back: Normal range of motion.     Right lower leg: No edema.     Left lower leg: No edema.  Feet:     Right foot:     Skin integrity: Callus (medial aspect of great toe) present.     Left foot:     Skin integrity: Callus (medial aspect of great toe) present.  Skin:    General: Skin is warm and dry.     Capillary Refill: Capillary refill takes less than 2 seconds.  Neurological:     General: No focal deficit present.     Mental Status: She is alert and oriented to person, place, and time.  Psychiatric:        Mood and Affect: Mood normal.        Behavior: Behavior normal.        Thought Content: Thought content normal.        Judgment: Judgment normal.    BP (!) 114/48   Pulse 65   Temp 97.6 F (36.4 C)   Ht _0  (1.6 m)   Wt 194 lb 0.6 oz (88 kg)   LMP  (LMP Unknown)   SpO2 98%   BMI 34.37 kg/m  Wt Readings from Last 3 Encounters:  10/23/20 194 lb 0.6 oz (88 kg)  10/08/20 195 lb 12.8 oz (88.8 kg)  04/27/20 189 lb 6.4 oz (85.9 kg)     Health Maintenance Due  Topic Date Due   Hepatitis C Screening  Never done   Zoster Vaccines- Shingrix (1 of 2) Never done   INFLUENZA VACCINE  10/19/2020    There are no preventive care reminders to display for this patient.  Lab Results  Component Value Date   TSH 1.740 04/27/2020   Lab  Results  Component Value Date   WBC 8.1 10/08/2020   HGB 10.6 (L) 10/08/2020   HCT 33.6 (L) 10/08/2020   MCV 83.4 10/08/2020   PLT 219 10/08/2020   Lab Results  Component Value Date   NA 143 10/08/2020   K 4.0 10/08/2020   CHLORIDE 110 (H) 01/24/2017   CO2 24 10/08/2020   GLUCOSE 99 10/08/2020   BUN 13 10/08/2020   CREATININE 0.60 10/08/2020   BILITOT 0.6 10/08/2020   ALKPHOS 67 10/08/2020   AST 46 (H) 10/08/2020   ALT 30 10/08/2020   PROT 6.9 10/08/2020   ALBUMIN 3.5 10/08/2020   CALCIUM 8.8 (L) 10/08/2020   ANIONGAP 7 10/08/2020   EGFR >60 01/24/2017   Lab Results  Component Value Date   CHOL 121 04/27/2020   Lab Results  Component Value Date   HDL 40 04/27/2020   Lab Results  Component Value Date   LDLCALC 66 04/27/2020   Lab Results  Component Value Date   TRIG 75 04/27/2020   Lab Results  Component Value Date   CHOLHDL 3.0 04/27/2020   Lab Results  Component Value Date   HGBA1C 5.8 (A) 04/26/2019      Assessment & Plan:   Problem List Items Addressed This Visit       Cardiovascular and Mediastinum   Essential hypertension Stable  Encouraged on going compliance with current medication regimen Encouraged home monitoring and recording BP <130/80 Eating a heart-healthy diet with less salt Encouraged regular physical activity  Recommend Weight loss   Other Visit Diagnoses     Encounter for  hepatitis C screening test for low risk patient      Relevant Orders   Hepatitis C antibody   Plantar fasciitis, right     Persistent will continue with current OTC  tx. Will follow up with [facility] if symptoms persist or worsen    Hyperlipidemia, unspecified hyperlipidemia type Stable Labs pending    Relevant Orders   Lipid panel   Heel spur, right     Worsening will continue with OTC tx and massage If worsens will follow up with [facility]        No orders of the defined types were placed in this encounter.   Follow-up: Return in about 3  months (around 01/23/2021) for Follow up HTN [id number].    Alexandra Francois, NP

## 2020-10-24 LAB — LIPID PANEL
Chol/HDL Ratio: 3 ratio (ref 0.0–4.4)
Cholesterol, Total: 104 mg/dL (ref 100–199)
HDL: 35 mg/dL — ABNORMAL LOW (ref >39)
LDL Chol Calc (NIH): 53 mg/dL (ref 0–99)
Triglycerides: 76 mg/dL (ref 0–149)
VLDL Cholesterol Cal: 16 mg/dL (ref 5–40)

## 2020-10-24 LAB — HEPATITIS C ANTIBODY: Hep C Virus Ab: <0.1 s/co ratio (ref 0.0–0.9)

## 2020-10-26 ENCOUNTER — Ambulatory Visit: Payer: Medicaid Other | Admitting: Family Medicine

## 2020-11-02 ENCOUNTER — Inpatient Hospital Stay: Payer: Medicaid Other | Attending: Oncology

## 2020-11-30 ENCOUNTER — Inpatient Hospital Stay: Payer: Medicaid Other | Attending: Oncology

## 2020-12-08 ENCOUNTER — Other Ambulatory Visit: Payer: Self-pay

## 2020-12-08 DIAGNOSIS — E538 Deficiency of other specified B group vitamins: Secondary | ICD-10-CM

## 2020-12-08 MED ORDER — VITAMIN B-12 1000 MCG PO TABS: 1000.0000 ug | ORAL_TABLET | Freq: Every day | ORAL | 2 refills | Status: DC

## 2020-12-28 ENCOUNTER — Inpatient Hospital Stay: Payer: Medicaid Other | Attending: Oncology

## 2021-01-04 ENCOUNTER — Other Ambulatory Visit: Payer: Self-pay

## 2021-01-04 DIAGNOSIS — E785 Hyperlipidemia, unspecified: Secondary | ICD-10-CM

## 2021-01-04 DIAGNOSIS — K219 Gastro-esophageal reflux disease without esophagitis: Secondary | ICD-10-CM

## 2021-01-04 DIAGNOSIS — I10 Essential (primary) hypertension: Secondary | ICD-10-CM

## 2021-01-04 MED ORDER — OMEPRAZOLE 40 MG PO CPDR
40.0000 mg | DELAYED_RELEASE_CAPSULE | Freq: Every day | ORAL | 2 refills | Status: DC
Start: 2021-01-04 — End: 2021-01-20

## 2021-01-04 MED ORDER — ATORVASTATIN CALCIUM 40 MG PO TABS: ORAL_TABLET | ORAL | 1 refills | Status: DC

## 2021-01-04 MED ORDER — AMLODIPINE BESYLATE 5 MG PO TABS: 5.0000 mg | ORAL_TABLET | Freq: Every day | ORAL | 3 refills | Status: DC

## 2021-01-20 ENCOUNTER — Encounter: Payer: Self-pay | Admitting: Nurse Practitioner

## 2021-01-20 ENCOUNTER — Ambulatory Visit (INDEPENDENT_AMBULATORY_CARE_PROVIDER_SITE_OTHER): Payer: Medicaid Other | Admitting: Nurse Practitioner

## 2021-01-20 ENCOUNTER — Other Ambulatory Visit: Payer: Self-pay

## 2021-01-20 VITALS — BP 121/55 | HR 69 | Temp 97.5°F | Ht 63.0 in | Wt 195.0 lb

## 2021-01-20 DIAGNOSIS — Z23 Encounter for immunization: Secondary | ICD-10-CM | POA: Diagnosis not present

## 2021-01-20 DIAGNOSIS — I10 Essential (primary) hypertension: Secondary | ICD-10-CM

## 2021-01-20 DIAGNOSIS — E785 Hyperlipidemia, unspecified: Secondary | ICD-10-CM

## 2021-01-20 DIAGNOSIS — K219 Gastro-esophageal reflux disease without esophagitis: Secondary | ICD-10-CM | POA: Diagnosis not present

## 2021-01-20 DIAGNOSIS — M2550 Pain in unspecified joint: Secondary | ICD-10-CM

## 2021-01-20 DIAGNOSIS — Z131 Encounter for screening for diabetes mellitus: Secondary | ICD-10-CM

## 2021-01-20 DIAGNOSIS — E538 Deficiency of other specified B group vitamins: Secondary | ICD-10-CM

## 2021-01-20 DIAGNOSIS — D649 Anemia, unspecified: Secondary | ICD-10-CM

## 2021-01-20 DIAGNOSIS — Z1321 Encounter for screening for nutritional disorder: Secondary | ICD-10-CM

## 2021-01-20 MED ORDER — DICLOFENAC SODIUM 1 % EX GEL: 4.0000 g | Freq: Four times a day (QID) | CUTANEOUS | 11 refills | Status: DC

## 2021-01-20 MED ORDER — OMEPRAZOLE 40 MG PO CPDR: 40.0000 mg | DELAYED_RELEASE_CAPSULE | Freq: Every day | ORAL | 3 refills | Status: DC

## 2021-01-20 MED ORDER — ATORVASTATIN CALCIUM 40 MG PO TABS: ORAL_TABLET | ORAL | 3 refills | Status: DC

## 2021-01-20 MED ORDER — VITAMIN B-12 1000 MCG PO TABS: 1000.0000 ug | ORAL_TABLET | Freq: Every day | ORAL | 3 refills | Status: AC

## 2021-01-20 NOTE — Patient Instructions (Addendum)
Managing Your Hypertension Hypertension, also called high blood pressure, is when the force of the blood pressing against the walls of the arteries is too strong. Arteries are blood vessels that carry blood from your heart throughout your body. Hypertension forces the heart to work harder to pump blood and may cause the arteries to become narrow or stiff. Understanding blood pressure readings Your personal target blood pressure may vary depending on your medical conditions, your age, and other factors. A blood pressure reading includes a higher number over a lower number. Ideally, your blood pressure should be below 120/80. You should know that: The first, or top, number is called the systolic pressure. It is a measure of the pressure in your arteries as your heart beats. The second, or bottom number, is called the diastolic pressure. It is a measure of the pressure in your arteries as the heart relaxes. Blood pressure is classified into four stages. Based on your blood pressure reading, your health care provider may use the following stages to determine what type of treatment you need, if any. Systolic pressure and diastolic pressure are measured in a unit called mmHg. Normal Systolic pressure: below 120. Diastolic pressure: below 80. Elevated Systolic pressure: 120-129. Diastolic pressure: below 80. Hypertension stage 1 Systolic pressure: 130-139. Diastolic pressure: 80-89. Hypertension stage 2 Systolic pressure: 140 or above. Diastolic pressure: 90 or above. How can this condition affect me? Managing your hypertension is an important responsibility. Over time, hypertension can damage the arteries and decrease blood flow to important parts of the body, including the brain, heart, and kidneys. Having untreated or uncontrolled hypertension can lead to: A heart attack. A stroke. A weakened blood vessel (aneurysm). Heart failure. Kidney damage. Eye damage. Metabolic syndrome. Memory and  concentration problems. Vascular dementia. What actions can I take to manage this condition? Hypertension can be managed by making lifestyle changes and possibly by taking medicines. Your health care provider will help you make a plan to bring your blood pressure within a normal range. Nutrition  Eat a diet that is high in fiber and potassium, and low in salt (sodium), added sugar, and fat. An example eating plan is called the Dietary Approaches to Stop Hypertension (DASH) diet. To eat this way: Eat plenty of fresh fruits and vegetables. Try to fill one-half of your plate at each meal with fruits and vegetables. Eat whole grains, such as whole-wheat pasta, brown rice, or whole-grain bread. Fill about one-fourth of your plate with whole grains. Eat low-fat dairy products. Avoid fatty cuts of meat, processed or cured meats, and poultry with skin. Fill about one-fourth of your plate with lean proteins such as fish, chicken without skin, beans, eggs, and tofu. Avoid pre-made and processed foods. These tend to be higher in sodium, added sugar, and fat. Reduce your daily sodium intake. Most people with hypertension should eat less than 1,500 mg of sodium a day. Lifestyle  Work with your health care provider to maintain a healthy body weight or to lose weight. Ask what an ideal weight is for you. Get at least 30 minutes of exercise that causes your heart to beat faster (aerobic exercise) most days of the week. Activities may include walking, swimming, or biking. Include exercise to strengthen your muscles (resistance exercise), such as weight lifting, as part of your weekly exercise routine. Try to do these types of exercises for 30 minutes at least 3 days a week. Do not use any products that contain nicotine or tobacco, such as cigarettes, e-cigarettes,   and chewing tobacco. If you need help quitting, ask your health care provider. Control any long-term (chronic) conditions you have, such as high  cholesterol or diabetes. Identify your sources of stress and find ways to manage stress. This may include meditation, deep breathing, or making time for fun activities. Alcohol use Do not drink alcohol if: Your health care provider tells you not to drink. You are pregnant, may be pregnant, or are planning to become pregnant. If you drink alcohol: Limit how much you use to: 0-1 drink a day for women. 0-2 drinks a day for men. Be aware of how much alcohol is in your drink. In the [place] one drink equals one 12 oz bottle of beer (355 mL), one 5 oz glass of wine (148 mL), or one 1 oz glass of hard liquor (44 mL). Medicines Your health care provider may prescribe medicine if lifestyle changes are not enough to get your blood pressure under control and if: Your systolic blood pressure is 130 or higher. Your diastolic blood pressure is 80 or higher. Take medicines only as told by your health care provider. Follow the directions carefully. Blood pressure medicines must be taken as told by your health care provider. The medicine does not work as well when you skip doses. Skipping doses also puts you at risk for problems. Monitoring Before you monitor your blood pressure: Do not smoke, drink caffeinated beverages, or exercise within 30 minutes before taking a measurement. Use the bathroom and empty your bladder (urinate). Sit quietly for at least 5 minutes before taking measurements. Monitor your blood pressure at home as told by your health care provider. To do this: Sit with your back straight and supported. Place your feet flat on the floor. Do not cross your legs. Support your arm on a flat surface, such as a table. Make sure your upper arm is at heart level. Each time you measure, take two or three readings one minute apart and record the results. You may also need to have your blood pressure checked regularly by your health care provider. General information Talk with your health care  provider about your diet, exercise habits, and other lifestyle factors that may be contributing to hypertension. Review all the medicines you take with your health care provider because there may be side effects or interactions. Keep all visits as told by your health care provider. Your health care provider can help you create and adjust your plan for managing your high blood pressure. Where to find more information National Heart, Lung, and Blood Institute: www.nhlbi.nih.gov American Heart Association: www.heart.org Contact a health care provider if: You think you are having a reaction to medicines you have taken. You have repeated (recurrent) headaches. You feel dizzy. You have swelling in your ankles. You have trouble with your vision. Get help right away if: You develop a severe headache or confusion. You have unusual weakness or numbness, or you feel faint. You have severe pain in your chest or abdomen. You vomit repeatedly. You have trouble breathing. These symptoms may represent a serious problem that is an emergency. Do not wait to see if the symptoms will go away. Get medical help right away. Call your local emergency services (911 in the [place] Do not drive yourself to the hospital. Summary Hypertension is when the force of blood pumping through your arteries is too strong. If this condition is not controlled, it may put you at risk for serious complications. Your personal target blood pressure may vary depending on   your medical conditions, your age, and other factors. For most people, a normal blood pressure is less than 120/80. Hypertension is managed by lifestyle changes, medicines, or both. Lifestyle changes to help manage hypertension include losing weight, eating a healthy, low-sodium diet, exercising more, stopping smoking, and limiting alcohol. This information is not intended to replace advice given to you by your health care provider. Make sure you discuss any questions  you have with your health care provider. Document Revised: 04/12/2019 Document Reviewed: 02/05/2019 Elsevier Patient Education  2022 [employer].  Vitamin B12 Deficiency Vitamin B12 deficiency means that your body does not have enough vitamin B12. The body needs this vitamin: To make red blood cells. To make genes (DNA). To help the nerves work. If you do not have enough vitamin B12 in your body, you can have health problems. What are the causes? Not eating enough foods that contain vitamin B12. Not being able to absorb vitamin B12 from the food that you eat. Certain digestive system diseases. A condition in which the body does not make enough of a certain protein, which results in too few red blood cells (pernicious anemia). Having a surgery in which part of the stomach or small intestine is removed. Taking medicines that make it hard for the body to absorb vitamin B12. These medicines include: Heartburn medicines. Some antibiotic medicines. Other medicines that are used to treat certain conditions. What increases the risk? Being older than age 35. Eating a vegetarian or vegan diet, especially while you are pregnant. Eating a poor diet while you are pregnant. Taking certain medicines. Having alcoholism. What are the signs or symptoms? In some cases, there are no symptoms. If the condition leads to too few blood cells or nerve damage, symptoms can occur, such as: Feeling weak. Feeling tired (fatigued). Not being hungry. Weight loss. A loss of feeling (numbness) or tingling in your hands and feet. Redness and burning of the tongue. Being mixed up (confused) or having memory problems. Sadness (depression). Problems with your senses. This can include color blindness, ringing in the ears, or loss of taste. Watery poop (diarrhea) or trouble pooping (constipation). Trouble walking. If anemia is very bad, symptoms can include: Being short of breath. Being dizzy. Having a very  fast heartbeat. How is this treated? Changing the way you eat and drink, such as: Eating more foods that contain vitamin B12. Drinking little or no alcohol. Getting vitamin B12 shots. Taking vitamin B12 supplements. Your doctor will tell you the dose that is best for you. Follow these instructions at home: Eating and drinking  Eat lots of healthy foods that contain vitamin B12. These include: Meats and poultry, such as beef, pork, chicken, [place], and organ meats, such as liver. Seafood, such as clams, rainbow trout, salmon, tuna, and haddock. Eggs. Cereal and dairy products that have vitamin B12 added to them. Check the label. The items listed above may not be a complete list of what you can eat and drink. Contact a dietitian for more options. General instructions Get any shots as told by your doctor. Take supplements only as told by your doctor. Do not drink alcohol if your doctor tells you not to. In some cases, you may only be asked to limit alcohol use. Keep all follow-up visits as told by your doctor. This is important. Contact a doctor if: Your symptoms come back. Get help right away if: You have trouble breathing. You have a very fast heartbeat. You have chest pain. You get dizzy. You  pass out. Summary Vitamin B12 deficiency means that your body is not getting enough vitamin B12. In some cases, there are no symptoms of this condition. Treatment may include making a change in the way you eat and drink, getting vitamin B12 shots, or taking supplements. Eat lots of healthy foods that contain vitamin B12. This information is not intended to replace advice given to you by your health care provider. Make sure you discuss any questions you have with your health care provider. Document Revised: 11/14/2017 Document Reviewed: 11/14/2017 Elsevier Patient Education  2022 [employer].

## 2021-01-20 NOTE — Progress Notes (Signed)
[place], [place]  [address] Phone:  [phone]   Fax:  [phone]   New Patient Office Visit  Subjective:  Patient ID: Alexandra Beasley, female    DOB: June 25, 1965  Age: 55 y.o. MRN: [id number]  CC:  Chief Complaint  Patient presents with  . Follow-up    Pt is here today for her follow up visit. Pt states that when she sleeps on her left side it becomes painful. Pt states its been going on for a month.     HPI Alexandra Beasley presents for follow up. She  has a past medical history of Abnormal mammogram of left breast (08/31/2016), Arthritis, Atypical ductal hyperplasia of left breast, Bilateral lower extremity edema (04/2019), Bilateral lower extremity edema (04/2019), Breast mass, left, GERD (gastroesophageal reflux disease), Glaucoma, Hyperlipidemia, Hypertension, Other skin changes (04/2019), SVD (spontaneous vaginal delivery), Vitamin B12 deficiency, and Vitamin D deficiency (10/2019).   She is in today with her son as interpreter. She is following up for her HTN. She denies headache, dizziness, visual changes, shortness of breath, dyspnea on exertion, chest pain, nausea, vomiting or constipation. She is compliant with her Amlodipine 5 mg. She does not monitor her diet closely; she does eat a lot of vegetables. She does not exercise regularly. She does do some gardening. She continues on Atorvastatin 40 mg for her elevated cholesterol. She denies any side effects.   Her son requested a refill on her medications. Along with vitimain B12. She reports vitamin B12 injection from the cancer center. Her last vitamin B 12 was >3000. She denies any recent blood work. She does have recurrent apts for her B12 injections. She has missed several apts due to lack of transportation  Past Medical History:  Diagnosis Date  . Abnormal mammogram of left breast 08/31/2016  . Arthritis    knee  . Atypical ductal hyperplasia of left  breast   . Bilateral lower extremity edema 04/2019  . Bilateral lower extremity edema 04/2019  . Breast mass, left   . GERD (gastroesophageal reflux disease)   . Glaucoma   . Hyperlipidemia   . Hypertension   . Other skin changes 04/2019  . SVD (spontaneous vaginal delivery)    x 2  . Vitamin B12 deficiency   . Vitamin D deficiency 10/2019    Past Surgical History:  Procedure Laterality Date  . ABDOMINAL HYSTERECTOMY    . BREAST EXCISIONAL BIOPSY Left 2018   benign lumpectomy  . BREAST LUMPECTOMY WITH RADIOACTIVE SEED LOCALIZATION Left 08/31/2016   Procedure: BREAST LUMPECTOMY WITH RADIOACTIVE SEED LOCALIZATION;  Surgeon: Fanny Skates, MD;  Location: [facility];  Service: General;  Laterality: Left;  . CATARACT EXTRACTION, BILATERAL    . CESAREAN SECTION     patient denies this surgery    Family History  Problem Relation Age of Onset  . Vision loss Mother   . Hypertension Mother   . Heart disease Father   . Colon cancer Neg Hx   . Rectal cancer Neg Hx   . Stomach cancer Neg Hx     Social History   Socioeconomic History  . Marital status: Married    Spouse name: Not on file  . Number of children: 2  . Years of education: Not on file  . Highest education level: Not on file  Occupational History  . Not on file  Tobacco Use  . Smoking status: Never  . Smokeless tobacco: Never  Vaping  Use  . Vaping Use: Never used  Substance and Sexual Activity  . Alcohol use: No    Alcohol/week: 0.0 standard drinks  . Drug use: No  . Sexual activity: Not Currently    Partners: Male    Birth control/protection: Post-menopausal    Comment: hysterectomy  Other Topics Concern  . Not on file  Social History Narrative  . Not on file   Social Determinants of Health   Financial Resource Strain: Not on file  Food Insecurity: Not on file  Transportation Needs: Not on file  Physical Activity: Not on file  Stress: Not on file  Social Connections: Not on file   Intimate Partner Violence: Not on file    ROS Review of Systems  Musculoskeletal:        Left torso pain that comes and goes She is unable to describe the pain  She does not have to treat She reports that this pain is new within th last month. She has completed her mammogram for the year and it was negative. She does continue on there Tamoxifen    Objective:   Today's Vitals: BP (!) 121/55   Pulse 69   Temp (!) 97.5 F (36.4 C)   Ht 5\' 3"  (1.6 m)   Wt 195 lb (88.5 kg)   LMP  (LMP Unknown)   SpO2 97%   BMI 34.54 kg/m   Physical Exam Constitutional:      Appearance: She is obese.  HENT:     Head: Normocephalic and atraumatic.     Nose: Nose normal.     Mouth/Throat:     Mouth: Mucous membranes are moist.  Cardiovascular:     Rate and Rhythm: Normal rate and regular rhythm.     Pulses: Normal pulses.     Heart sounds: Normal heart sounds.  Pulmonary:     Effort: Pulmonary effort is normal.     Breath sounds: Normal breath sounds.  Abdominal:     General: Bowel sounds are normal.     Palpations: Abdomen is soft.     Comments: Increased abdominal girth   Musculoskeletal:        General: Normal range of motion.     Cervical back: Normal range of motion.     Right lower leg: Edema (trace) present.     Left lower leg: Edema (trace) present.  Skin:    General: Skin is warm and dry.     Comments: Discoloration to neck area  Neurological:     Mental Status: She is alert.    Assessment & Plan:   Problem List Items Addressed This Visit       Cardiovascular and Mediastinum   Essential hypertension - Primary Stable  Encouraged on going compliance with current medication regimen Encouraged home monitoring and recording BP <130/80 Eating a heart-healthy diet with less salt Encouraged regular physical activity  Recommend Weight loss     Relevant Medications   atorvastatin (LIPITOR) 40 MG tablet   Other Relevant Orders   Comp. Metabolic Panel (12)      Digestive   Gastroesophageal reflux disease without esophagitis Stable Continue with current regimen.  No changes warranted. Good patient compliance.    Relevant Medications   omeprazole (PRILOSEC) 40 MG capsule   Other Visit Diagnoses     Hyperlipidemia, unspecified hyperlipidemia type    Stable  Continue with current regimen.  No changes warranted. Good patient compliance. Encourage continue heart healthy diet and regular exercise.     Relevant  Medications   atorvastatin (LIPITOR) 40 MG tablet   Arthralgia, unspecified joint     Persistent but controlled   Relevant Medications   diclofenac Sodium (VOLTAREN) 1 % GEL   Vitamin B12 deficiency     Elevated Vit B12 levels  Reevaluation  Recommend to hold oral treatment for the next few months and then restart with either oral or injection therapy   Relevant Medications   vitamin B-12 (CYANOCOBALAMIN) 1000 MCG tablet   Other Relevant Orders   Vitamin B12   Anemia, unspecified type       Relevant Medications   vitamin B-12 (CYANOCOBALAMIN) 1000 MCG tablet   Other Relevant Orders   CBC with Differential/Platelet   Encounter for vitamin deficiency screening       Relevant Orders   VITAMIN D 25 Hydroxy (Vit-D Deficiency, Fractures)   Needs flu shot       Relevant Orders   Flu Vaccine QUAD 34mo+IM (Fluarix, Fluzone & Alfiuria Quad PF) (Completed)   Screening for diabetes mellitus       Relevant Orders   Comp. Metabolic Panel (12)       Outpatient Encounter Medications as of 01/20/2021  Medication Sig  . amLODipine (NORVASC) 5 MG tablet Take 1 tablet (5 mg total) by mouth daily.  . Calcium Carbonate-Vitamin D (CALTRATE 600+D PO) Take by mouth.  . Cholecalciferol (VITAMIN D3) 1000 units CAPS Take by mouth.  . latanoprost (XALATAN) 0.005 % ophthalmic solution Place 1 drop into both eyes daily.  . tamoxifen (NOLVADEX) 20 MG tablet Take 1 tablet (20 mg total) by mouth daily.  . timolol (BETIMOL) 0.5 % ophthalmic solution 1 drop  once.  . Wheat Dextrin (BENEFIBER) POWD Take by mouth daily.  . [DISCONTINUED] atorvastatin (LIPITOR) 40 MG tablet TAKE 1 TABLET(40 MG) BY MOUTH DAILY AT 6 PM  . [DISCONTINUED] diclofenac Sodium (VOLTAREN) 1 % GEL Apply 4 g topically 4 (four) times daily.  . [DISCONTINUED] omeprazole (PRILOSEC) 40 MG capsule Take 1 capsule (40 mg total) by mouth daily.  . [DISCONTINUED] vitamin B-12 (CYANOCOBALAMIN) 1000 MCG tablet Take 1 tablet (1,000 mcg total) by mouth daily.  Marland Kitchen atorvastatin (LIPITOR) 40 MG tablet TAKE 1 TABLET(40 MG) BY MOUTH DAILY AT 6 PM  . diclofenac Sodium (VOLTAREN) 1 % GEL Apply 4 g topically 4 (four) times daily.  Marland Kitchen omeprazole (PRILOSEC) 40 MG capsule Take 1 capsule (40 mg total) by mouth daily.  . vitamin B-12 (CYANOCOBALAMIN) 1000 MCG tablet Take 1 tablet (1,000 mcg total) by mouth daily.   No facility-administered encounter medications on file as of 01/20/2021.    Follow-up: Return in about 3 months (around 04/22/2021) for Follow up HTN [id number].   Vevelyn Francois, NP

## 2021-01-21 ENCOUNTER — Encounter: Payer: Self-pay | Admitting: Nurse Practitioner

## 2021-01-21 LAB — COMP. METABOLIC PANEL (12)
AST: 37 IU/L (ref 0–40)
Albumin/Globulin Ratio: 1.3 (ref 1.2–2.2)
Albumin: 3.8 g/dL (ref 3.8–4.9)
Alkaline Phosphatase: 79 IU/L (ref 44–121)
BUN/Creatinine Ratio: 15 (ref 9–23)
BUN: 10 mg/dL (ref 6–24)
Bilirubin Total: 0.2 mg/dL (ref 0.0–1.2)
Calcium: 9.4 mg/dL (ref 8.7–10.2)
Chloride: 104 mmol/L (ref 96–106)
Creatinine, Ser: 0.67 mg/dL (ref 0.57–1.00)
Globulin, Total: 2.9 g/dL (ref 1.5–4.5)
Glucose: 100 mg/dL — ABNORMAL HIGH (ref 70–99)
Potassium: 4.3 mmol/L (ref 3.5–5.2)
Sodium: 142 mmol/L (ref 134–144)
Total Protein: 6.7 g/dL (ref 6.0–8.5)
eGFR: 102 mL/min/{1.73_m2} (ref >59)

## 2021-01-21 LAB — CBC WITH DIFFERENTIAL/PLATELET
Basophils Absolute: 0 10*3/uL (ref 0.0–0.2)
Basos: 1 %
EOS (ABSOLUTE): 0.2 10*3/uL (ref 0.0–0.4)
Eos: 4 %
Hematocrit: 35.8 % (ref 34.0–46.6)
Hemoglobin: 11.3 g/dL (ref 11.1–15.9)
Immature Grans (Abs): 0 10*3/uL (ref 0.0–0.1)
Immature Granulocytes: 0 %
Lymphocytes Absolute: 1.6 10*3/uL (ref 0.7–3.1)
Lymphs: 32 %
MCH: 26 pg — ABNORMAL LOW (ref 26.6–33.0)
MCHC: 31.6 g/dL (ref 31.5–35.7)
MCV: 83 fL (ref 79–97)
Monocytes Absolute: 0.4 10*3/uL (ref 0.1–0.9)
Monocytes: 9 %
Neutrophils Absolute: 2.8 10*3/uL (ref 1.4–7.0)
Neutrophils: 54 %
Platelets: 210 10*3/uL (ref 150–450)
RBC: 4.34 x10E6/uL (ref 3.77–5.28)
RDW: 14.5 % (ref 11.7–15.4)
WBC: 5.1 10*3/uL (ref 3.4–10.8)

## 2021-01-21 LAB — VITAMIN B12: Vitamin B-12: >2000 pg/mL — ABNORMAL HIGH (ref 232–1245)

## 2021-01-21 LAB — VITAMIN D 25 HYDROXY (VIT D DEFICIENCY, FRACTURES): Vit D, 25-Hydroxy: 59.9 ng/mL (ref 30.0–100.0)

## 2021-01-25 ENCOUNTER — Inpatient Hospital Stay: Payer: Medicaid Other | Attending: Oncology

## 2021-02-22 ENCOUNTER — Inpatient Hospital Stay: Payer: Medicaid Other | Attending: Oncology

## 2021-03-23 ENCOUNTER — Inpatient Hospital Stay: Payer: Medicaid Other | Attending: Oncology

## 2021-03-23 ENCOUNTER — Encounter: Payer: Self-pay | Admitting: Oncology

## 2021-03-23 DIAGNOSIS — N6092 Unspecified benign mammary dysplasia of left breast: Secondary | ICD-10-CM | POA: Insufficient documentation

## 2021-03-23 DIAGNOSIS — K219 Gastro-esophageal reflux disease without esophagitis: Secondary | ICD-10-CM | POA: Insufficient documentation

## 2021-03-23 DIAGNOSIS — E559 Vitamin D deficiency, unspecified: Secondary | ICD-10-CM | POA: Insufficient documentation

## 2021-03-23 DIAGNOSIS — Z79810 Long term (current) use of selective estrogen receptor modulators (SERMs): Secondary | ICD-10-CM | POA: Insufficient documentation

## 2021-03-23 DIAGNOSIS — I10 Essential (primary) hypertension: Secondary | ICD-10-CM | POA: Insufficient documentation

## 2021-03-23 DIAGNOSIS — E538 Deficiency of other specified B group vitamins: Secondary | ICD-10-CM | POA: Insufficient documentation

## 2021-03-23 DIAGNOSIS — M8580 Other specified disorders of bone density and structure, unspecified site: Secondary | ICD-10-CM | POA: Insufficient documentation

## 2021-03-23 DIAGNOSIS — E785 Hyperlipidemia, unspecified: Secondary | ICD-10-CM | POA: Insufficient documentation

## 2021-03-23 DIAGNOSIS — Z79899 Other long term (current) drug therapy: Secondary | ICD-10-CM | POA: Insufficient documentation

## 2021-04-18 NOTE — Progress Notes (Signed)
[place]  [phone] [phone]     ID: Alexandra Beasley DOB: 19-Mar-1966  MR#: [id number]  GHW#:[id number]  Patient Care Team: Vevelyn Francois, NP as PCP - General (Adult Health Nurse Practitioner) Mauri Pole, MD as Consulting Physician (Gastroenterology) OTHER MD:  CHIEF COMPLAINT: Atypical lobular hyperplasia  CURRENT TREATMENT: Tamoxifen; B12 supplementation   INTERVAL HISTORY: Alexandra Beasley returns today for follow-up of her history of atypical lobular hyperplasia, accompanied by her son Alexandra Beasley to translate.   Alexandra Beasley continues on tamoxifen.  She tolerates this well with no side effects that she is aware of.  Her most recent mammogram was completed on 09/15/2020 and showed no mammographic evidence of malignancy and breast density category A.  She is due for repeat mammogram in one year.  She is experiencing increased left breast pain in the left lower outer quadrant of her breast.  She notes that there is no specific pattern.  She said sometimes she feels like the breast is a little bit more swollen than the other.  Her B12 level has consistently been above 2000 and she has been receiving injections and wonders if she still needs to continue to do so.  She is anemic today her hemoglobin is 10.8.   REVIEW OF SYSTEMS: Review of Systems  Constitutional:  Negative for appetite change, chills, fatigue, fever and unexpected weight change.  HENT:   Negative for hearing loss, lump/mass and trouble swallowing.   Eyes:  Negative for eye problems and icterus.  Respiratory:  Negative for chest tightness, cough and shortness of breath.   Cardiovascular:  Negative for chest pain, leg swelling and palpitations.  Gastrointestinal:  Negative for abdominal distention, abdominal pain, constipation, diarrhea, nausea and vomiting.  Endocrine: Negative for hot flashes.  Genitourinary:  Negative for difficulty urinating.   Musculoskeletal:  Negative for  arthralgias.  Skin:  Negative for itching and rash.  Neurological:  Negative for dizziness, extremity weakness, headaches and numbness.  Hematological:  Negative for adenopathy. Does not bruise/bleed easily.  Psychiatric/Behavioral:  Negative for depression. The patient is not nervous/anxious.      HISTORY OF CURRENT ILLNESS: From the original intake note:  "Alexandra Beasley" [[id number]] had bilateral screening mammography for 12/08/2016 at the [facility] Center. There was a possible distortion in the left breast, and on 07/21/2016 she'll she underwent left diagnostic mammography with tomography and left breast ultrasonography. The breast density was category B. This confirmed an area of distortion in the lower inner left breast which was not palpable. Ultrasonography found no definite correlate. The axilla was negative.  Biopsy of this area may 07/08/2016 showed ([id number]) a complex sclerosing lesion. Accordingly the patient was referred to surgery and on 08/31/2016 underwent left lumpectomy, which showed atypical lobular hyperplasia ([id number]). She was then referred to the high risk clinic. There has been some delay because of [facility] approval issues  The patient's subsequent history is as detailed below.   PAST MEDICAL HISTORY: Past Medical History:  Diagnosis Date   Abnormal mammogram of left breast 08/31/2016   Arthritis    knee   Atypical ductal hyperplasia of left breast    Bilateral lower extremity edema 04/2019   Bilateral lower extremity edema 04/2019   Breast mass, left    GERD (gastroesophageal reflux disease)    Glaucoma    Hyperlipidemia    Hypertension    Other skin changes 04/2019   SVD (spontaneous vaginal delivery)    x 2   Vitamin B12 deficiency  Vitamin D deficiency 10/2019    PAST SURGICAL HISTORY: Past Surgical History:  Procedure Laterality Date   ABDOMINAL HYSTERECTOMY     BREAST EXCISIONAL BIOPSY Left 2018   benign lumpectomy   BREAST LUMPECTOMY WITH  RADIOACTIVE SEED LOCALIZATION Left 08/31/2016   Procedure: BREAST LUMPECTOMY WITH RADIOACTIVE SEED LOCALIZATION;  Surgeon: Fanny Skates, MD;  Location: [facility];  Service: General;  Laterality: Left;   CATARACT EXTRACTION, BILATERAL     CESAREAN SECTION     patient denies this surgery    FAMILY HISTORY Family History  Problem Relation Age of Onset   Vision loss Mother    Hypertension Mother    Heart disease Father    Colon cancer Neg Hx    Rectal cancer Neg Hx    Stomach cancer Neg Hx   The patient's father is 5 years old and her mother 3 years old as of August 2018. The patient has one brother and one sister. There is no history of breast or ovarian cancer in the family.   GYNECOLOGIC HISTORY:  No LMP recorded (lmp unknown). Patient has had a hysterectomy. Menarche age 70, first live birth age 34, the patient is [place] P2. She is status post remote hysterectomy, without salpingo-oophorectomy   SOCIAL HISTORY:  She is originally from [place]. She is married, her husband has a history of head and neck cancer and is disabled as a result. He is followed at [facility]. The patient's daughter [name] owns 6 convenience stores. The patient's son [name] is studying pharmacy at [employer]. The patient has one granddaughter. She attends a local [employer]    ADVANCED DIRECTIVES: In the absence of any documentation to the contrary, the patient's spouse is their HCPOA.   HEALTH MAINTENANCE: Social History   Tobacco Use   Smoking status: Never   Smokeless tobacco: Never  Vaping Use   Vaping Use: Never used  Substance Use Topics   Alcohol use: No    Alcohol/week: 0.0 standard drinks   Drug use: No     Colonoscopy: Overdue  PAP: Status post hysterectomy  Bone density: 07/27/2017 showed a T score of -1.8 osteopenia   No Known Allergies  Current Outpatient Medications  Medication Sig Dispense Refill   amLODipine (NORVASC) 5 MG tablet Take 1 tablet (5 mg total) by mouth  daily. 90 tablet 3   atorvastatin (LIPITOR) 40 MG tablet TAKE 1 TABLET(40 MG) BY MOUTH DAILY AT 6 PM 90 tablet 3   Calcium Carbonate-Vitamin D (CALTRATE 600+D PO) Take by mouth.     Cholecalciferol (VITAMIN D3) 1000 units CAPS Take by mouth.     diclofenac Sodium (VOLTAREN) 1 % GEL Apply 4 g topically 4 (four) times daily. 350 g 11   latanoprost (XALATAN) 0.005 % ophthalmic solution Place 1 drop into both eyes daily.     omeprazole (PRILOSEC) 40 MG capsule Take 1 capsule (40 mg total) by mouth daily. 90 capsule 3   tamoxifen (NOLVADEX) 20 MG tablet Take 1 tablet (20 mg total) by mouth daily. 90 tablet 0   timolol (BETIMOL) 0.5 % ophthalmic solution 1 drop once.     vitamin B-12 (CYANOCOBALAMIN) 1000 MCG tablet Take 1 tablet (1,000 mcg total) by mouth daily. 90 tablet 3   Wheat Dextrin (BENEFIBER) POWD Take by mouth daily.     No current facility-administered medications for this visit.    OBJECTIVE:  Vitals:   04/19/21 0941  BP: (!) 149/68  Pulse: 87  Resp: 17  Temp: 97.7 F (36.5 C)  SpO2: 98%  Body mass index is 35.27 kg/m.   Wt Readings from Last 3 Encounters:  04/19/21 199 lb 1.6 oz (90.3 kg)  01/20/21 195 lb (88.5 kg)  10/23/20 194 lb 0.6 oz (88 kg)  ECOG FS:1 - Symptomatic but completely ambulatory GENERAL: Patient is a well appearing female in no acute distress HEENT:  Sclerae anicteric.  Mask in place.  Neck is supple.  NODES:  No cervical, supraclavicular, or axillary lymphadenopathy palpated.  BREAST EXAM: left breast s/p lumpectomy, no sign of mass or lesion, however left lower outer quadrant there is 1 area about 2 cm from the nipple approximately 7:00 with slight thickness change in the breast density and positive TTP, right breast benign LUNGS:  Clear to auscultation bilaterally.  No wheezes or rhonchi. HEART:  Regular rate and rhythm. No murmur appreciated. ABDOMEN:  Soft, nontender.  Positive, normoactive bowel sounds. No organomegaly palpated. MSK:  No focal  spinal tenderness to palpation. Full range of motion bilaterally in the upper extremities. EXTREMITIES:  No peripheral edema.   SKIN:  Clear with no obvious rashes or skin changes. No nail dyscrasia. NEURO:  Nonfocal. Well oriented.  Appropriate affect.    LAB RESULTS:       Chemistry      Component Value Date/Time   NA 142 01/20/2021 1158   NA 141 01/24/2017 1018   K 4.3 01/20/2021 1158   K 4.0 01/24/2017 1018   CL 104 01/20/2021 1158   CO2 24 10/08/2020 0947   CO2 25 01/24/2017 1018   BUN 10 01/20/2021 1158   BUN 9.6 01/24/2017 1018   CREATININE 0.67 01/20/2021 1158   CREATININE 0.60 10/08/2020 0947   CREATININE 0.7 01/24/2017 1018      Component Value Date/Time   CALCIUM 9.4 01/20/2021 1158   CALCIUM 9.7 01/24/2017 1018   ALKPHOS 79 01/20/2021 1158   ALKPHOS 87 01/24/2017 1018   AST 37 01/20/2021 1158   AST 46 (H) 10/08/2020 0947   AST 26 01/24/2017 1018   ALT 30 10/08/2020 0947   ALT 26 01/24/2017 1018   BILITOT 0.2 01/20/2021 1158   BILITOT 0.6 10/08/2020 0947   BILITOT 0.40 01/24/2017 1018        Appointment on 04/19/2021  Component Date Value Ref Range Status   WBC Count 04/19/2021 7.2  4.0 - 10.5 K/uL Final   RBC 04/19/2021 4.18  3.87 - 5.11 MIL/uL Final   Hemoglobin 04/19/2021 10.8 (L)  12.0 - 15.0 g/dL Final   HCT 04/19/2021 34.5 (L)  36.0 - 46.0 % Final   MCV 04/19/2021 82.5  80.0 - 100.0 fL Final   MCH 04/19/2021 25.8 (L)  26.0 - 34.0 pg Final   MCHC 04/19/2021 31.3  30.0 - 36.0 g/dL Final   RDW 04/19/2021 14.6  11.5 - 15.5 % Final   Platelet Count 04/19/2021 230  150 - 400 K/uL Final   nRBC 04/19/2021 0.0  0.0 - 0.2 % Final   Neutrophils Relative % 04/19/2021 66  % Final   Neutro Abs 04/19/2021 4.8  1.7 - 7.7 K/uL Final   Lymphocytes Relative 04/19/2021 23  % Final   Lymphs Abs 04/19/2021 1.6  0.7 - 4.0 K/uL Final   Monocytes Relative 04/19/2021 8  % Final   Monocytes Absolute 04/19/2021 0.5  0.1 - 1.0 K/uL Final   Eosinophils Relative  04/19/2021 3  % Final   Eosinophils Absolute 04/19/2021 0.2  0.0 - 0.5 K/uL Final  Basophils Relative 04/19/2021 0  % Final   Basophils Absolute 04/19/2021 0.0  0.0 - 0.1 K/uL Final   Immature Granulocytes 04/19/2021 0  % Final   Abs Immature Granulocytes 04/19/2021 0.01  0.00 - 0.07 K/uL Final   Performed at [facility], [place] [address] [place], [place] [address]     STUDIES:  CLINICAL DATA:  Screening.   EXAM: DIGITAL SCREENING BILATERAL MAMMOGRAM WITH TOMOSYNTHESIS AND CAD   TECHNIQUE: Bilateral screening digital craniocaudal and mediolateral oblique mammograms were obtained. Bilateral screening digital breast tomosynthesis was performed. The images were evaluated with computer-aided detection.   COMPARISON:  Previous exam(s).   ACR Breast Density Category a: The breast tissue is almost entirely fatty.   FINDINGS: There are no findings suspicious for malignancy.   IMPRESSION: No mammographic evidence of malignancy. A result letter of this screening mammogram will be mailed directly to the patient.   RECOMMENDATION: Screening mammogram in one year. (Code:[id number])   BI-RADS CATEGORY  1: Negative.     Electronically Signed   By: Lajean Manes M.D.   On: 09/16/2020 13:22  ASSESSMENT: 56 y.o. [place] woman status post left breast lumpectomy 08/31/2016 for atypical lobular hyperplasia  (1) anastrozole started 11/07/2016, discontinued February 07, 2017 with poor tolerance  (2) started tamoxifen 03/21/17  (a) status post remote hysterectomy, without salpingo-oophorectomy  (3) Osteopenia: bone density 07/27/2017 showed a T score of -1.8   (4) B12 deficiency: On monthly supplementation   PLAN: Alexandra Beasley is here today for f/u of her [place] on tamoxifen therapy.  She continues to tolerate the tamoxifen therapy well.  She will continue on this.  Due to her focal breast tenderness pain and questionable skin change on her breast we will do a  diagnostic mammogram and ultrasound.  I have asked that my nurse get this scheduled for the breast center.  She will undergo regular screening mammogram in June, 2023.  We discussed that she has 1 more year on tamoxifen and will finish this at the end of 2023.  I discussed with her the lab results.  Her most recent B12 above 2000.  Instead of receiving injections every 4 weeks we will instead switch to sublingual oral B12 and see how she tolerates this.  This does not account for the fact that she has an anemia with a hemoglobin of 10.8.  She has no abnormal kidney function.  I have added on iron tests to be completed.  We will call her with the results of her labs.  She will let us know if she has any questions or concerns.  We will see her back in 6 months for labs and follow-up.  Total encounter time 30 minutes.* in chart review, lab review, face to face visit time, order entry, care coordination, and documentation of the encounter.   Wilber Bihari, NP 04/19/21 9:44 AM Medical Oncology and Hematology [facility] [place], [place] [address] Tel. [phone]    Fax. [phone]   *Total Encounter Time as defined by the Centers for Medicare and Medicaid Services includes, in addition to the face-to-face time of a patient visit (documented in the note above) non-face-to-face time: obtaining and reviewing outside history, ordering and reviewing medications, tests or procedures, care coordination (communications with other health care professionals or caregivers) and documentation in the medical record.

## 2021-04-19 ENCOUNTER — Encounter: Payer: Self-pay | Admitting: Adult Health

## 2021-04-19 ENCOUNTER — Inpatient Hospital Stay: Payer: Medicaid Other

## 2021-04-19 ENCOUNTER — Other Ambulatory Visit: Payer: Self-pay

## 2021-04-19 ENCOUNTER — Inpatient Hospital Stay (HOSPITAL_BASED_OUTPATIENT_CLINIC_OR_DEPARTMENT_OTHER): Payer: Medicaid Other | Admitting: Adult Health

## 2021-04-19 VITALS — BP 149/68 | HR 87 | Temp 97.7°F | Resp 17 | Wt 199.1 lb

## 2021-04-19 DIAGNOSIS — N6092 Unspecified benign mammary dysplasia of left breast: Secondary | ICD-10-CM

## 2021-04-19 DIAGNOSIS — Z79899 Other long term (current) drug therapy: Secondary | ICD-10-CM | POA: Diagnosis not present

## 2021-04-19 DIAGNOSIS — D6489 Other specified anemias: Secondary | ICD-10-CM

## 2021-04-19 DIAGNOSIS — E538 Deficiency of other specified B group vitamins: Secondary | ICD-10-CM

## 2021-04-19 DIAGNOSIS — E785 Hyperlipidemia, unspecified: Secondary | ICD-10-CM | POA: Diagnosis not present

## 2021-04-19 DIAGNOSIS — M8580 Other specified disorders of bone density and structure, unspecified site: Secondary | ICD-10-CM | POA: Diagnosis not present

## 2021-04-19 DIAGNOSIS — I10 Essential (primary) hypertension: Secondary | ICD-10-CM | POA: Diagnosis not present

## 2021-04-19 DIAGNOSIS — K219 Gastro-esophageal reflux disease without esophagitis: Secondary | ICD-10-CM | POA: Diagnosis not present

## 2021-04-19 DIAGNOSIS — Z79810 Long term (current) use of selective estrogen receptor modulators (SERMs): Secondary | ICD-10-CM | POA: Diagnosis not present

## 2021-04-19 DIAGNOSIS — E559 Vitamin D deficiency, unspecified: Secondary | ICD-10-CM | POA: Diagnosis not present

## 2021-04-19 LAB — CMP (CANCER CENTER ONLY)
ALT: 26 U/L (ref 0–44)
AST: 38 U/L (ref 15–41)
Albumin: 3.7 g/dL (ref 3.5–5.0)
Alkaline Phosphatase: 80 U/L (ref 38–126)
Anion gap: 7 (ref 5–15)
BUN: 10 mg/dL (ref 6–20)
CO2: 24 mmol/L (ref 22–32)
Calcium: 9.2 mg/dL (ref 8.9–10.3)
Chloride: 109 mmol/L (ref 98–111)
Creatinine: 0.64 mg/dL (ref 0.44–1.00)
GFR, Estimated: >60 mL/min (ref >60)
Glucose, Bld: 102 mg/dL — ABNORMAL HIGH (ref 70–99)
Potassium: 4 mmol/L (ref 3.5–5.1)
Sodium: 140 mmol/L (ref 135–145)
Total Bilirubin: 0.4 mg/dL (ref 0.3–1.2)
Total Protein: 7.2 g/dL (ref 6.5–8.1)

## 2021-04-19 LAB — CBC WITH DIFFERENTIAL (CANCER CENTER ONLY)
Abs Immature Granulocytes: 0.01 10*3/uL (ref 0.00–0.07)
Basophils Absolute: 0 10*3/uL (ref 0.0–0.1)
Basophils Relative: 0 %
Eosinophils Absolute: 0.2 10*3/uL (ref 0.0–0.5)
Eosinophils Relative: 3 %
HCT: 34.5 % — ABNORMAL LOW (ref 36.0–46.0)
Hemoglobin: 10.8 g/dL — ABNORMAL LOW (ref 12.0–15.0)
Immature Granulocytes: 0 %
Lymphocytes Relative: 23 %
Lymphs Abs: 1.6 10*3/uL (ref 0.7–4.0)
MCH: 25.8 pg — ABNORMAL LOW (ref 26.0–34.0)
MCHC: 31.3 g/dL (ref 30.0–36.0)
MCV: 82.5 fL (ref 80.0–100.0)
Monocytes Absolute: 0.5 10*3/uL (ref 0.1–1.0)
Monocytes Relative: 8 %
Neutro Abs: 4.8 10*3/uL (ref 1.7–7.7)
Neutrophils Relative %: 66 %
Platelet Count: 230 10*3/uL (ref 150–400)
RBC: 4.18 MIL/uL (ref 3.87–5.11)
RDW: 14.6 % (ref 11.5–15.5)
WBC Count: 7.2 10*3/uL (ref 4.0–10.5)
nRBC: 0 % (ref 0.0–0.2)

## 2021-04-19 LAB — IRON AND IRON BINDING CAPACITY (CC-WL,HP ONLY)
Iron: 39 ug/dL (ref 28–170)
Saturation Ratios: 8 % — ABNORMAL LOW (ref 10.4–31.8)
TIBC: 483 ug/dL — ABNORMAL HIGH (ref 250–450)
UIBC: 444 ug/dL — ABNORMAL HIGH (ref 148–442)

## 2021-04-19 LAB — RETIC PANEL
Immature Retic Fract: 29.6 % — ABNORMAL HIGH (ref 2.3–15.9)
RBC.: 4.21 MIL/uL (ref 3.87–5.11)
Retic Count, Absolute: 80.8 10*3/uL (ref 19.0–186.0)
Retic Ct Pct: 1.9 % (ref 0.4–3.1)
Reticulocyte Hemoglobin: 30.4 pg (ref >27.9)

## 2021-04-19 LAB — VITAMIN B12: Vitamin B-12: 3173 pg/mL — ABNORMAL HIGH (ref 180–914)

## 2021-04-19 LAB — FERRITIN: Ferritin: 21 ng/mL (ref 11–307)

## 2021-04-19 NOTE — Patient Instructions (Signed)
Vitamin B12 Sublingual Tablets What is this medication? VITAMIN B12 (VAHY tuh min B12) prevents and treats low vitamin B12 levels in your body. It is used in people who do not get enough vitamin B12 from their diet or when their digestive tract does not absorb enough. Vitamin B12 plays an important role in maintaining the health of your nervous system and red blood cells. This medicine may be used for other purposes; ask your health care provider or pharmacist if you have questions. What should I tell my care team before I take this medication? They need to know if you have any of these conditions: Anemia Kidney disease Leber's disease Malabsorption disorder An unusual or allergic reaction to cyanocobalamin, cobalt, other medications, foods, dyes, or preservatives Pregnant or trying to get pregnant Breast-feeding How should I use this medication? Take this medication by mouth with a glass of water. Follow the directions on the package or prescription label. If you are taking the tablets, do not chew, cut, or crush this medication. If using a vitamin solution, use a specially marked spoon or dropper to measure each dose. Ask your pharmacist if you do not have one. Household spoons are not accurate. For best results take this vitamin with food. Take your medication at regular intervals. Do not take your medication more often than directed. Talk to your care team about the use of this medication in children. While this medication may be prescribed for selected conditions, precautions do apply. Overdosage: If you think you have taken too much of this medicine contact a poison control center or emergency room at once. NOTE: This medicine is only for you. Do not share this medicine with others. What if I miss a dose? If you miss a dose, take it as soon as you can. If it is almost time for your next dose, take only that dose. Do not take double or extra doses. What may interact with this  medication? Alcohol Aminosalicylic acid Colchicine Medications that suppress your bone marrow like chemotherapy, chloramphenicol This list may not describe all possible interactions. Give your health care provider a list of all the medicines, herbs, non-prescription drugs, or dietary supplements you use. Also tell them if you smoke, drink alcohol, or use illegal drugs. Some items may interact with your medicine. What should I watch for while using this medication? Follow a healthy diet. Taking a vitamin supplement does not replace the need for a balanced diet. Some foods that have vitamin B12 naturally are fish, seafood, egg yolk, milk, and fermented cheese. Too much of this vitamin can be unsafe. Talk to your care team about how much is right for you. What side effects may I notice from receiving this medication? Side effects that you should report to your care team as soon as possible: Allergic reactions--skin rash, itching, hives, swelling of the face, lips, tongue, or throat Side effects that usually do not require medical attention (report to your care team if they continue or are bothersome): Diarrhea Fatigue Headache Nausea This list may not describe all possible side effects. Call your doctor for medical advice about side effects. You may report side effects to FDA at 1-800-FDA-1088. Where should I keep my medication? Keep out of the reach of children and pets. Store at room temperature between 15 and 30 degrees C (59 and 85 degrees F). Protect from heat and light. Throw away any unused medication after the expiration date. NOTE: This sheet is a summary. It may not cover all possible information.  If you have questions about this medicine, talk to your doctor, pharmacist, or health care provider.  2022 Elsevier/Gold Standard (2020-06-12 00:00:00)

## 2021-04-19 NOTE — Progress Notes (Unsigned)
error 

## 2021-04-20 LAB — HCV INTERPRETATION

## 2021-04-20 LAB — HCV AB W REFLEX TO QUANT PCR: HCV Ab: <0.1 s/co ratio (ref 0.0–0.9)

## 2021-04-22 ENCOUNTER — Telehealth: Payer: Self-pay

## 2021-04-22 NOTE — Telephone Encounter (Signed)
Called and spoke with pt daughter, Binni.  Informed her that her mom's iron is low and could be contributing to her fatigue.  We could try IV iron to help, she says she would like to proceed with this, therefore I sent a message to provider and scheduling.  Pt daughter verbalized understanding and thanks.

## 2021-04-23 ENCOUNTER — Telehealth: Payer: Self-pay | Admitting: Adult Health

## 2021-04-23 ENCOUNTER — Ambulatory Visit (INDEPENDENT_AMBULATORY_CARE_PROVIDER_SITE_OTHER): Payer: Medicaid Other | Admitting: Nurse Practitioner

## 2021-04-23 ENCOUNTER — Other Ambulatory Visit: Payer: Self-pay | Admitting: Adult Health

## 2021-04-23 ENCOUNTER — Other Ambulatory Visit: Payer: Self-pay

## 2021-04-23 ENCOUNTER — Encounter: Payer: Self-pay | Admitting: Nurse Practitioner

## 2021-04-23 VITALS — BP 119/56 | HR 77 | Temp 98.1°F | Ht 63.0 in | Wt 198.6 lb

## 2021-04-23 DIAGNOSIS — M25474 Effusion, right foot: Secondary | ICD-10-CM

## 2021-04-23 DIAGNOSIS — M25472 Effusion, left ankle: Secondary | ICD-10-CM

## 2021-04-23 DIAGNOSIS — E785 Hyperlipidemia, unspecified: Secondary | ICD-10-CM

## 2021-04-23 DIAGNOSIS — I10 Essential (primary) hypertension: Secondary | ICD-10-CM

## 2021-04-23 DIAGNOSIS — K219 Gastro-esophageal reflux disease without esophagitis: Secondary | ICD-10-CM

## 2021-04-23 DIAGNOSIS — H938X2 Other specified disorders of left ear: Secondary | ICD-10-CM | POA: Diagnosis not present

## 2021-04-23 DIAGNOSIS — M25471 Effusion, right ankle: Secondary | ICD-10-CM

## 2021-04-23 DIAGNOSIS — M25475 Effusion, left foot: Secondary | ICD-10-CM

## 2021-04-23 NOTE — Telephone Encounter (Signed)
Sch per 2/2 inbasket, pt & daughter aware

## 2021-04-23 NOTE — Progress Notes (Signed)
[facility] [place], [place]  [address] Phone:  [phone]   Fax:  [phone]   Established Patient Office Visit  Subjective:  Patient ID: Alexandra Beasley, female    DOB: 10/19/1965  Age: 56 y.o. MRN: [id number]  CC:  Chief Complaint  Patient presents with   Follow-up    Pt is here today for her today with her son for her 3 month follow up visit. Pt states that her left ear feels clogged off and on but only in the mornings.    HPI Alexandra Beasley presents for follow up. She  has a past medical history of Abnormal mammogram of left breast (08/31/2016), Arthritis, Atypical ductal hyperplasia of left breast, Bilateral lower extremity edema (04/2019), Bilateral lower extremity edema (04/2019), Breast mass, left, GERD (gastroesophageal reflux disease), Glaucoma, Hyperlipidemia, Hypertension, Other skin changes (04/2019), SVD (spontaneous vaginal delivery), Vitamin B12 deficiency, and Vitamin D deficiency (10/2019).   Ms. Windom is in today for follow up for Hypertension. The current prescribed treatment is Amlodipine 5 mg. Denies headache, dizziness, visual changes, shortness of breath, dyspnea on exertion, chest pain, nausea, vomiting or any edema.    She also notes a plugged sensation in the left ear. Symptoms have been present several days. She does not have a history of ear infections. She does not have a history of recent swimming. She has tried no medications  for her symptoms. She has been recently treated for and URI symptoms. She denies any new medication for trama. She does like to sleep on her left side. She does rest her and up to her face.   She is requesting an example of exercises.  Past Medical History:  Diagnosis Date   Abnormal mammogram of left breast 08/31/2016   Arthritis    knee   Atypical ductal hyperplasia of left breast    Bilateral lower extremity edema 04/2019   Bilateral lower extremity edema 04/2019    Breast mass, left    GERD (gastroesophageal reflux disease)    Glaucoma    Hyperlipidemia    Hypertension    Other skin changes 04/2019   SVD (spontaneous vaginal delivery)    x 2   Vitamin B12 deficiency    Vitamin D deficiency 10/2019    Past Surgical History:  Procedure Laterality Date   ABDOMINAL HYSTERECTOMY     BREAST EXCISIONAL BIOPSY Left 2018   benign lumpectomy   BREAST LUMPECTOMY WITH RADIOACTIVE SEED LOCALIZATION Left 08/31/2016   Procedure: BREAST LUMPECTOMY WITH RADIOACTIVE SEED LOCALIZATION;  Surgeon: Fanny Skates, MD;  Location: [facility];  Service: General;  Laterality: Left;   CATARACT EXTRACTION, BILATERAL     CESAREAN SECTION     patient denies this surgery    Family History  Problem Relation Age of Onset   Vision loss Mother    Hypertension Mother    Heart disease Father    Colon cancer Neg Hx    Rectal cancer Neg Hx    Stomach cancer Neg Hx     Social History   Socioeconomic History   Marital status: Married    Spouse name: Not on file   Number of children: 2   Years of education: Not on file   Highest education level: Not on file  Occupational History   Not on file  Tobacco Use   Smoking status: Never   Smokeless tobacco: Never  Vaping Use   Vaping Use: Never used  Substance and  Sexual Activity   Alcohol use: No    Alcohol/week: 0.0 standard drinks   Drug use: No   Sexual activity: Not Currently    Partners: Male    Birth control/protection: Post-menopausal    Comment: hysterectomy  Other Topics Concern   Not on file  Social History Narrative   Not on file   Social Determinants of Health   Financial Resource Strain: Not on file  Food Insecurity: Not on file  Transportation Needs: Not on file  Physical Activity: Not on file  Stress: Not on file  Social Connections: Not on file  Intimate Partner Violence: Not on file    Outpatient Medications Prior to Visit  Medication Sig Dispense Refill   amLODipine  (NORVASC) 5 MG tablet Take 1 tablet (5 mg total) by mouth daily. 90 tablet 3   atorvastatin (LIPITOR) 40 MG tablet TAKE 1 TABLET(40 MG) BY MOUTH DAILY AT 6 PM 90 tablet 3   Calcium Carbonate-Vitamin D (CALTRATE 600+D PO) Take by mouth.     Cholecalciferol (VITAMIN D3) 1000 units CAPS Take by mouth.     diclofenac Sodium (VOLTAREN) 1 % GEL Apply 4 g topically 4 (four) times daily. 350 g 11   latanoprost (XALATAN) 0.005 % ophthalmic solution Place 1 drop into both eyes daily.     meloxicam (MOBIC) 15 MG tablet Take 15 mg by mouth daily.     omeprazole (PRILOSEC) 40 MG capsule Take 1 capsule (40 mg total) by mouth daily. 90 capsule 3   tamoxifen (NOLVADEX) 20 MG tablet Take 1 tablet (20 mg total) by mouth daily. 90 tablet 0   timolol (BETIMOL) 0.5 % ophthalmic solution 1 drop once.     timolol (TIMOPTIC) 0.5 % ophthalmic solution 1 drop every morning.     vitamin B-12 (CYANOCOBALAMIN) 1000 MCG tablet Take 1 tablet (1,000 mcg total) by mouth daily. 90 tablet 3   Wheat Dextrin (BENEFIBER) POWD Take by mouth daily.     No facility-administered medications prior to visit.    No Known Allergies  ROS Review of Systems    Objective:    Physical Exam Constitutional:      General: She is not in acute distress.    Appearance: She is obese. She is not ill-appearing, toxic-appearing or diaphoretic.  HENT:     Head: Normocephalic and atraumatic.     Right Ear: Tympanic membrane normal. There is no impacted cerumen.     Left Ear: Tympanic membrane normal. There is no impacted cerumen.     Nose: Nose normal.     Mouth/Throat:     Mouth: Mucous membranes are moist.  Neck:     Vascular: No carotid bruit.  Cardiovascular:     Rate and Rhythm: Normal rate and regular rhythm.     Pulses: Normal pulses.     Heart sounds: Normal heart sounds.  Pulmonary:     Effort: Pulmonary effort is normal.     Breath sounds: Normal breath sounds.  Abdominal:     General: Bowel sounds are normal.      Palpations: Abdomen is soft.     Comments: Increased abdominal girth   Musculoskeletal:        General: Normal range of motion.     Cervical back: Normal range of motion. No rigidity or tenderness.     Right lower leg: No edema (trace).     Left lower leg: No edema (trace).  Lymphadenopathy:     Cervical: No cervical adenopathy.  Skin:  General: Skin is warm and dry.     Capillary Refill: Capillary refill takes less than 2 seconds.     Comments: Discoloration to neck area  Neurological:     General: No focal deficit present.     Mental Status: She is alert and oriented to person, place, and time.  Psychiatric:        Mood and Affect: Mood normal.        Behavior: Behavior normal.        Thought Content: Thought content normal.        Judgment: Judgment normal.    BP (!) 119/56    Pulse 77    Temp 98.1 F (36.7 C)    Ht _0  (1.6 m)    Wt 198 lb 9.6 oz (90.1 kg)    LMP  (LMP Unknown)    SpO2 98%    BMI 35.18 kg/m  Wt Readings from Last 3 Encounters:  04/23/21 198 lb 9.6 oz (90.1 kg)  04/19/21 199 lb 1.6 oz (90.3 kg)  01/20/21 195 lb (88.5 kg)     Health Maintenance Due  Topic Date Due   Zoster Vaccines- Shingrix (1 of 2) Never done   COVID-19 Vaccine (4 - Booster for Pfizer series) 03/27/2020    There are no preventive care reminders to display for this patient.  Lab Results  Component Value Date   TSH 1.740 04/27/2020   Lab Results  Component Value Date   WBC 7.2 04/19/2021   HGB 10.8 (L) 04/19/2021   HCT 34.5 (L) 04/19/2021   MCV 82.5 04/19/2021   PLT 230 04/19/2021   Lab Results  Component Value Date   NA 140 04/19/2021   K 4.0 04/19/2021   CHLORIDE 110 (H) 01/24/2017   CO2 24 04/19/2021   GLUCOSE 102 (H) 04/19/2021   BUN 10 04/19/2021   CREATININE 0.64 04/19/2021   BILITOT 0.4 04/19/2021   ALKPHOS 80 04/19/2021   AST 38 04/19/2021   ALT 26 04/19/2021   PROT 7.2 04/19/2021   ALBUMIN 3.7 04/19/2021   CALCIUM 9.2 04/19/2021   ANIONGAP 7  04/19/2021   EGFR 102 01/20/2021   Lab Results  Component Value Date   CHOL 104 10/23/2020   Lab Results  Component Value Date   HDL 35 (L) 10/23/2020   Lab Results  Component Value Date   LDLCALC 53 10/23/2020   Lab Results  Component Value Date   TRIG 76 10/23/2020   Lab Results  Component Value Date   CHOLHDL 3.0 10/23/2020   Lab Results  Component Value Date   HGBA1C 5.8 (A) 04/26/2019      Assessment & Plan:   Problem List Items Addressed This Visit       Cardiovascular and Mediastinum   Essential hypertension Stable Encouraged on going compliance with current medication regimen Encouraged home monitoring and recording BP <130/80 Eating a heart-healthy diet with less salt Encouraged regular physical activity  Recommend Weight loss       Digestive   Gastroesophageal reflux disease without esophagitis Stable Continue with current regimen.  No changes warranted. Good patient compliance.      Other   Bilateral swelling of feet and ankles Education  Exercises provided    Other Visit Diagnoses     Congestion of left ear    -  Primary Education and recommendation provided    Hyperlipidemia, unspecified hyperlipidemia type    Stable Continue with current regimen.  No changes warranted. Good patient compliance.  No orders of the defined types were placed in this encounter.   Follow-up: Return in about 6 months (around 10/21/2021) for Follow up HTN [id number].    Vevelyn Francois, NP

## 2021-04-23 NOTE — Patient Instructions (Addendum)
Elsevier Patient Education  2022 [place] Ear drainage is the discharge of earwax, pus, blood, or other fluids from the ear. Follow these instructions at home: Pay attention to changes in your ear drainage. Report any changes to your health care provider. Follow these instructions to help relieve your symptoms. Protecting your ear  Do not use cotton-tipped swabs in your ear. Do not put any other objects into your ear. Do not swim until your health care provider has approved. Before you shower, cover a cotton ball with petroleum jelly and put that into your ear. This helps to keep water out of your ear. Wash your hands with soap and water for 20 seconds before and after you touch your ears. General instructions Take over-the-counter and prescription medicines only as told by your health care provider. Finish all antibiotic medicine even when you start to feel better. Avoid any exposure to tobacco smoke. Keep all follow-up visits. This is important. Contact a health care provider if: You have increased drainage. You have ear pain. You have a fever. Your drainage is not getting better with treatment. Your ear drainage is bloody, white, clear, or yellow. Your ear is red or swollen. Get help right away if: You have severe ear pain. You have a severe headache. You vomit. You feel dizzy. You have a seizure. You have new hearing loss. These symptoms may represent a serious problem that is an emergency. Do not wait to see if the symptoms will go away. Get medical help right away. Call your local emergency services (911 in the [place] Do not drive yourself to the hospital. Summary Ear drainage is the discharge of earwax, pus, blood, or other fluids from the ear. Pay attention to any changes in your symptoms. Tell your health care provider about them. Follow instructions from your health care provider. Contact your health care provider if you have more drainage, bloody  drainage, ear pain, fever, or swelling. Get help right away if you have severe ear pain, a severe headache, vomiting, dizziness, seizure, or new hearing loss. This information is not intended to replace advice given to you by your health care provider. Make sure you discuss any questions you have with your health care provider. Document Revised: 04/21/2020 Document Reviewed: 04/21/2020 Elsevier Patient Education  2022 [employer].   Exercise Information for Aging Adults Staying physically active is important as you age. Physical activity and exercise can help in maintaining quality of life, health, physical function, and reducing falls. The four types of exercises that are best for older adults are endurance, strength, balance, and flexibility. Contact your health care provider before you start any exercise routine. Ask your health care provider what activities are safe for you. What are the risks? Risks associated with exercising include: Overdoing it. This may lead to sore muscles or fatigue. Falls. Injuries. Dehydration. How to do these exercises Endurance exercises Endurance (aerobic) exercises raise your breathing rate and heart rate. Increasing your endurance helps you do everyday tasks and stay healthy. By improving the health of your body system that includes your heart, lungs, and blood vessels (circulatory system), you may also delay or prevent diseases such as heart disease, diabetes, and weak bones (osteoporosis). Types of endurance exercises include: Sports. Indoor activities, such as using gym equipment, doing water aerobics, or dancing. Outdoor activities, such as biking or jogging. Tasks around the house, such as gardening, yard work, and heavy household chores like cleaning. Walking, such as hiking or walking around your  neighborhood. When doing endurance exercises, make sure you: Are aware of your surroundings. Use safety equipment as directed. Dress in layers when  exercising outdoors. Drink plenty of water to stay well hydrated. Build up endurance slowly. Start with 10 minutes at a time, and gradually build up to doing 30 minutes at a time. Unless your health care provider gave you different instructions, aim to exercise for a total of 150 minutes a week. Spread out that time so you are working on endurance 3 or more days a week. Strength exercises Lifting, pulling, or pushing weights helps to strengthen muscles. Having stronger muscles makes it easier to do everyday activities, such as getting up from a chair, climbing stairs, carrying groceries, and playing with grandchildren. Strength exercises include arm and leg exercises that may be done: With weights. Without weights (using your own body weight). With a resistance band. When doing strength exercises: Move smoothly and steadily. Do not suddenly thrust or jerk the weights, the resistance band, or your body. Start with no weights or with light weights, and gradually add more weight over time. Eventually, aim to use weights that are hard or very hard for you to lift. This means that you are able to do 8 repetitions with the weight, and the last few repetitions are very challenging. Lift or push weights into position for 3 seconds, hold the position for 1 second, and then take 3 seconds to return to your starting position. Breathe out (exhale) during difficult movements, like lifting or pushing weights. Breathe in (inhale) to relax your muscles before the next repetition. Consider alternating arms or legs, especially when you first start strength exercises. Expect some slight muscle soreness after each session. Do strength exercises on 2 or more days a week, for 30 minutes at a time. Avoid exercising the same muscle groups two days in a row. For example, if you work on your leg muscles one day, work on your arm muscles the next day. When you can do two sets of 10-15 repetitions with a certain weight, increase  the amount of weight. Balance exercises Balance exercises can help to prevent falls. Balance exercises include: Standing on one foot. Heel-to-toe walk. Balance walk. Tai chi. Make sure you have something sturdy to hold onto while doing balance exercises, such as a sturdy chair. As your balance improves, challenge yourself by holding on to the chair with one hand instead of two, and then with no hands. Trying exercises with your eyes closed also challenges your balance, but be sure to have a sturdy surface (like a countertop) close by in case you need it. Do balance exercises as often as you want, or as often as directed by your health care provider. Flexibility exercises Flexibility exercises improve how far you can bend, straighten, move, or rotate parts of your body (range of motion). These exercises also help you do everyday activities such as getting dressed or reaching for objects. Flexibility exercises include stretching different parts of the body, and they may be done in a standing or seated position or on the floor. When stretching, make sure you: Keep a slight bend in your arms and legs. Avoid completely straightening ("locking") your joints. Do not stretch so far that you feel pain. You should feel a mild stretching feeling. You may try stretching farther as you become more flexible over time. Relax and breathe between stretches. Hold on to something sturdy for balance as needed. Hold each stretch for 10-30 seconds. Repeat each stretch 3-5 times.  General safety tips Exercise in well-lit areas. Do not hold your breath during exercises or stretches. Warm up before exercising, and cool down after exercising. This can help prevent injury. Drink plenty of water during exercise or any activity that makes you sweat. If you are not sure if an exercise is safe for you, or you are not sure how to do an exercise, talk with your health care provider. This is especially important if you have had  surgery on muscles, bones, or joints (orthopedic surgery). Where to find more information You can find more information about exercise for older adults from: Your local health department, fitness center, or community center. These facilities may have programs for aging adults. [employer] on Aging: http://kim-miller.com/ National Council on Aging: www.ncoa.org Summary Staying physically active is important as you age. Doing endurance, strength, balance, and flexibility exercises can help in maintaining quality of life, health, physical function, and reducing falls. Make sure to contact your health care provider before you start any exercise routine. Ask your health care provider what activities are safe for you. This information is not intended to replace advice given to you by your health care provider. Make sure you discuss any questions you have with your health care provider. Document Revised: 07/20/2020 Document Reviewed: 07/20/2020 Elsevier Patient Education  2022 [employer].  Exercising to Stay Healthy To become healthy and stay healthy, it is recommended that you do moderate-intensity and vigorous-intensity exercise. You can tell that you are exercising at a moderate intensity if your heart starts beating faster and you start breathing faster but can still hold a conversation. You can tell that you are exercising at a vigorous intensity if you are breathing much harder and faster and cannot hold a conversation while exercising. How can exercise benefit me? Exercising regularly is important. It has many health benefits, such as: Improving overall fitness, flexibility, and endurance. Increasing bone density. Helping with weight control. Decreasing body fat. Increasing muscle strength and endurance. Reducing stress and tension, anxiety, depression, or anger. Improving overall health. What guidelines should I follow while exercising? Before you start a new exercise program, talk with  your health care provider. Do not exercise so much that you hurt yourself, feel dizzy, or get very short of breath. Wear comfortable clothes and wear shoes with good support. Drink plenty of water while you exercise to prevent dehydration or heat stroke. Work out until your breathing and your heartbeat get faster (moderate intensity). How often should I exercise? Choose an activity that you enjoy, and set realistic goals. Your health care provider can help you make an activity plan that is individually designed and works best for you. Exercise regularly as told by your health care provider. This may include: Doing strength training two times a week, such as: Lifting weights. Using resistance bands. Push-ups. Sit-ups. Yoga. Doing a certain intensity of exercise for a given amount of time. Choose from these options: A total of 150 minutes of moderate-intensity exercise every week. A total of 75 minutes of vigorous-intensity exercise every week. A mix of moderate-intensity and vigorous-intensity exercise every week. Children, pregnant women, people who have not exercised regularly, people who are overweight, and older adults may need to talk with a health care provider about what activities are safe to perform. If you have a medical condition, be sure to talk with your health care provider before you start a new exercise program. What are some exercise ideas? Moderate-intensity exercise ideas include: Walking 1 mile (1.6 km) in  about 15 minutes. Biking. Hiking. Golfing. Dancing. Water aerobics. Vigorous-intensity exercise ideas include: Walking 4.5 miles (7.2 km) or more in about 1 hour. Jogging or running 5 miles (8 km) in about 1 hour. Biking 10 miles (16.1 km) or more in about 1 hour. Lap swimming. Roller-skating or in-line skating. Cross-country skiing. Vigorous competitive sports, such as football, basketball, and soccer. Jumping rope. Aerobic dancing. What are some everyday  activities that can help me get exercise? Yard work, such as: Psychologist, educational. Raking and bagging leaves. Washing your car. Pushing a stroller. Shoveling snow. Gardening. Washing windows or floors. How can I be more active in my day-to-day activities? Use stairs instead of an elevator. Take a walk during your lunch break. If you drive, park your car farther away from your work or school. If you take public transportation, get off one stop early and walk the rest of the way. Stand up or walk around during all of your indoor phone calls. Get up, stretch, and walk around every 30 minutes throughout the day. Enjoy exercise with a friend. Support to continue exercising will help you keep a regular routine of activity. Where to find more information You can find more information about exercising to stay healthy from: [place] Department of Health and Human Services: [employer].at Centers for Disease Control and Prevention (CDC): http://www.wolf.info/ Summary Exercising regularly is important. It will improve your overall fitness, flexibility, and endurance. Regular exercise will also improve your overall health. It can help you control your weight, reduce stress, and improve your bone density. Do not exercise so much that you hurt yourself, feel dizzy, or get very short of breath. Before you start a new exercise program, talk with your health care provider. This information is not intended to replace advice given to you by your health care provider. Make sure you discuss any questions you have with your health care provider. Document Revised: 07/03/2020 Document Reviewed: 07/03/2020 Elsevier Patient Education  [place].  Exercises for Chronic Knee Pain Chronic knee pain is pain that lasts longer than 3 months. For most people with chronic knee pain, exercise and weight loss is an important part of treatment. Your health care provider may want you to focus on: Strengthening the muscles that  support your knee. This can take pressure off your knee and lessen pain. Preventing knee stiffness. Maintaining or increasing how far you can move your knee. Losing weight (if this applies) to take pressure off your knee, decrease your risk for injury, and make it easier for you to exercise. Your health care provider will help you develop an exercise program that matches your needs and physical abilities. Below are simple, low-impact exercises you can do at home. Ask your health care provider or a physical therapist how often you should do your exercise program and how many times to repeat each exercise. General safety tips Follow these safety tips for exercising with chronic knee pain: Get your health care provider's approval before doing any exercises. Start slowly and stop any time an exercise causes pain. Do not exercise if your knee pain is flaring up. Warm up first. Stretching a cold muscle can cause an injury. Do 5-10 minutes of easy movement or light stretching before beginning your exercise routine. Do 5-10 minutes of low-impact activity (like walking or cycling) before starting strengthening exercises. Contact your health care provider any time you have pain during or after exercising. Exercise may cause discomfort but should not be painful. It is normal to  be a little stiff or sore after exercising.  Stretching and range-of-motion exercises Front thigh stretch  Stand up straight and support your body by holding on to a chair or resting one hand on a wall. With your legs straight and close together, bend one knee to lift your heel up toward your buttocks. Using one hand for support, grab your ankle with your free hand. Pull your foot up closer toward your buttocks to feel the stretch in front of your thigh. Hold the stretch for 30 seconds. Repeat __________ times. Complete this exercise __________ times a day. Back thigh stretch  Sit on the floor with your back straight and your  legs out straight in front of you. Place the palms of your hands on the floor and slide them toward your feet as you bend at the hip. Try to touch your nose to your knees and feel the stretch in the back of your thighs. Hold for 30 seconds. Repeat __________ times. Complete this exercise __________ times a day. Calf stretch  Stand facing a wall. Place the palms of your hands flat against the wall, arms extended, and lean slightly against the wall. Get into a lunge position with one leg bent at the knee and the other leg stretched out straight behind you. Keep both feet facing the wall and increase the bend in your knee while keeping the heel of the other leg flat on the ground. You should feel the stretch in your calf. Hold for 30 seconds. Repeat __________ times. Complete this exercise __________ times a day. Strengthening exercises Straight leg lift Lie on your back with one knee bent and the other leg out straight. Slowly lift the straight leg without bending the knee. Lift until your foot is about 12 inches (30 cm) off the floor. Hold for 3-5 seconds and slowly lower your leg. Repeat __________ times. Complete this exercise __________ times a day. Single leg dip Stand between two chairs and put both hands on the backs of the chairs for support. Extend one leg out straight with your body weight resting on the heel of the standing leg. Slowly bend your standing knee to dip your body to the level that is comfortable for you. Hold for 3-5 seconds. Repeat __________ times. Complete this exercise __________ times a day. Hamstring curls Stand straight, knees close together, facing the back of a chair. Hold on to the back of a chair with both hands. Keep one leg straight. Bend the other knee while bringing the heel up toward the buttock until the knee is bent at a 90-degree angle (right angle). Hold for 3-5 seconds. Repeat __________ times. Complete this exercise __________ times a  day. Wall squat Stand straight with your back, hips, and head against a wall. Step forward one foot at a time with your back still against the wall. Your feet should be 2 feet (61 cm) from the wall at shoulder width. Keeping your back, hips, and head against the wall, slide down the wall to as close of a sitting position as you can get. Hold for 5-10 seconds, then slowly slide back up. Repeat __________ times. Complete this exercise __________ times a day. Step-ups Step up with one foot onto a sturdy platform or stool that is about 6 inches (15 cm) high. Face sideways with one foot on the platform and one on the ground. Place all your weight on the platform foot and lift your body off the ground until your knee extends. Let your other  leg hang free to the side. Hold for 3-5 seconds then slowly lower your weight down to the floor foot. Repeat __________ times. Complete this exercise __________ times a day. Contact a health care provider if: Your exercise causes pain. Your pain is worse after you exercise. Your pain prevents you from doing your exercises. This information is not intended to replace advice given to you by your health care provider. Make sure you discuss any questions you have with your health care provider. Document Revised: 07/11/2019 Document Reviewed: 03/04/2019 Elsevier Patient Education  2022 [place].  Physical Activity With Heart Disease Being active has many benefits, especially if you have heart disease. Physical activity can help you do more and feel healthier. Start slowly, and increase the amount of time you spend being active. You should aim for physical activity that: Makes you breathe harder and raises your heart rate (aerobic activity). Try to get at least 150 minutes of aerobic activity each week. This is about 30 minutes each day, 5 days a week. Helps build muscle strength (strengthening activity). Do this at least 2 times a week. A good rule of thumb is  to work hard enough to breathe harder but still be able to carry on a conversation. If you can sing, you may not be working hard enough. You may also want to monitor your heart rate (pulse) and blood pressure. Ask your health care provider what kind of tools you will need to track these. Always talk with your health care provider before starting any new activity program or if you have any changes in your condition. What are the benefits of physical activity? Physical activity can help improve your heart and blood vessel (cardiovascular) health. It can: Lower your blood pressure. Lower your cholesterol. Control your weight. Help control your blood sugar. Improve the function of your heart and lungs. Reduce your risk of developing blood clots. Physical activity can help improve other aspects of your health. It can: Prevent bone loss. Improve your sleep. Improve your energy level. Reduce stress. What are some types of physical activity I could try? There are many ways to be active. Talk with your health care provider about what types and intensity of activity is right for you. Aerobic activity Aerobic (cardiovascular) activity can be moderate or vigorous intensity, depending on how hard you are working. Moderate-intensity activity includes: Walking. Slow bicycling. Water aerobics. Dancing. Light gardening or house work. Vigorous-intensity activity includes: Jogging or running. Stair climbing. Swimming laps. Hiking uphill. Heavy gardening, such as digging trenches.  Strengthening activity Strengthening activities work your muscles to build strength. Some examples include: Doing push-ups, sit-ups, or pull-ups. Lifting small weights. Using resistance bands. Yoga.  Flexibility Flexibility activities lengthen your muscles to keep them flexible and less tight and improve your balance. Some examples include: Stretching. Yoga. Tai chi. [place] [employer].  Follow these instructions at  home: How to get started Talk with your health care provider about: What types of activities are safe for you. If you should check your pulse or take other precautions during physical activity. Get a calendar. Write down a schedule and plan for your new routine. At the start of your workout, as well as at the end, remember to warm up and cool down to allow a gradual increase or decrease in heart rate and breathing. If you have not been active, begin with sessions that last 10-15 minutes. Gradually work up to sessions that last 20-30 minutes, 5 times a week. Follow all of your  health care provider's recommendations. Take time to find out what works for you. Consider the following: Join a community program, such as a biking group, yoga class, local gym, or swimming pool membership. Be active on your own by downloading free workout applications on a smartphone or other devices, or by purchasing workout DVDs. Be patient with yourself. It takes time to build up strength and lung capacity. Safety Exercise in an indoor, climate-controlled facility, as told by your health care provider. You may need to do this if: There are extreme outdoor conditions, such as heat, humidity, or cold. There is an air pollution advisory. Your local news, board of health, or hospital can provide information on air quality. Take extra precautions as told by your health care provider. This may include: Monitoring your heart rate. Avoiding heavy lifting. Understanding how your medicines can affect you during physical activity. Certain medicines may cause heat intolerance or changes in blood sugar. Slowing down to rest when you need to. Keeping nitroglycerin spray and tablets with you at all times if you have angina. Use them as told to prevent and treat symptoms. Drink plenty of water before, during, and after physical activity. Know what symptoms may be signs of a problem and stop physical activity right away if you have  any of those symptoms. Where to find more information American Heart Association: www.heart.org [place] Department of Health and Human Services: MissingBag.si Get help right away if you have any of the following during exercise: Chest pain, shortness of breath, or feel very tired. Pain in the arm, shoulder, neck, or jaw. You feel weak, dizzy, or light-headed. An irregular heart rate, or your heart rate is greater than 100 beats per minute (bpm) before exercise. These symptoms may represent a serious problem that is an emergency. Do not wait to see if the symptoms go away. Get medical help right away. Call your local emergency services (911 in the [place] Do not drive yourself to the hospital.  Summary Physical activity has many benefits, especially if you have heart disease. Before starting an activity program, talk with your health care provider about how often to be active and what type of activity is safe for you. Your physical activity plan may include moderate or vigorous aerobic activity, strengthening activities, and flexibility. Know what symptoms may be signs of a problem. Stop physical activity right away and call emergency services (911 in the [place] if you have any of those symptoms. This information is not intended to replace advice given to you by your health care provider. Make sure you discuss any questions you have with your health care provider. Document Revised: 08/18/2020 Document Reviewed: 08/18/2020 Elsevier Patient Education  [place].  Knee Exercises Ask your health care provider which exercises are safe for you. Do exercises exactly as told by your health care provider and adjust them as directed. It is normal to feel mild stretching, pulling, tightness, or discomfort as you do these exercises. Stop right away if you feel sudden pain or your pain gets worse. Do not begin these exercises until told by your health care provider. Stretching and range-of-motion  exercises These exercises warm up your muscles and joints and improve the movement and flexibility of your knee. These exercises also help to relieve pain and swelling. Knee extension, prone  Lie on your abdomen (prone position) on a bed. Place your left / right knee just beyond the edge of the surface so your knee is not on the bed. You can  put a towel under your left / right thigh just above your kneecap for comfort. Relax your leg muscles and allow gravity to straighten your knee (extension). You should feel a stretch behind your left / right knee. Hold this position for __________ seconds. Scoot up so your knee is supported between repetitions. Repeat __________ times. Complete this exercise __________ times a day. Knee flexion, active  Lie on your back with both legs straight. If this causes back discomfort, bend your left / right knee so your foot is flat on the floor. Slowly slide your left / right heel back toward your buttocks. Stop when you feel a gentle stretch in the front of your knee or thigh (flexion). Hold this position for __________ seconds. Slowly slide your left / right heel back to the starting position. Repeat __________ times. Complete this exercise __________ times a day. Quadriceps stretch, prone  Lie on your abdomen on a firm surface, such as a bed or padded floor. Bend your left / right knee and hold your ankle. If you cannot reach your ankle or pant leg, loop a belt around your foot and grab the belt instead. Gently pull your heel toward your buttocks. Your knee should not slide out to the side. You should feel a stretch in the front of your thigh and knee (quadriceps). Hold this position for __________ seconds. Repeat __________ times. Complete this exercise __________ times a day. Hamstring, supine  Lie on your back (supine position). Loop a belt or towel over the ball of your left / right foot. The ball of your foot is on the walking surface, right under your  toes. Straighten your left / right knee and slowly pull on the belt to raise your leg until you feel a gentle stretch behind your knee (hamstring). Do not let your knee bend while you do this. Keep your other leg flat on the floor. Hold this position for __________ seconds. Repeat __________ times. Complete this exercise __________ times a day. Strengthening exercises These exercises build strength and endurance in your knee. Endurance is the ability to use your muscles for a long time, even after they get tired. Quadriceps, isometric This exercise strengthens the muscles in front of your thigh (quadriceps) without moving your knee joint (isometric). Lie on your back with your left / right leg extended and your other knee bent. Put a rolled towel or small pillow under your knee if told by your health care provider. Slowly tense the muscles in the front of your left / right thigh. You should see your kneecap slide up toward your hip or see increased dimpling just above the knee. This motion will push the back of the knee toward the floor. For __________ seconds, hold the muscle as tight as you can without increasing your pain. Relax the muscles slowly and completely. Repeat __________ times. Complete this exercise __________ times a day. Straight leg raises This exercise strengthens the muscles in front of your thigh (quadriceps) and the muscles that move your hips (hip flexors). Lie on your back with your left / right leg extended and your other knee bent. Tense the muscles in the front of your left / right thigh. You should see your kneecap slide up or see increased dimpling just above the knee. Your thigh may even shake a bit. Keep these muscles tight as you raise your leg 4-6 inches (10-15 cm) off the floor. Do not let your knee bend. Hold this position for __________ seconds. Keep these muscles  tense as you lower your leg. Relax your muscles slowly and completely after each  repetition. Repeat __________ times. Complete this exercise __________ times a day. Hamstring, isometric  Lie on your back on a firm surface. Bend your left / right knee about __________ degrees. Dig your left / right heel into the surface as if you are trying to pull it toward your buttocks. Tighten the muscles in the back of your thighs (hamstring) to "dig" as hard as you can without increasing any pain. Hold this position for __________ seconds. Release the tension gradually and allow your muscles to relax completely for __________ seconds after each repetition. Repeat __________ times. Complete this exercise __________ times a day. Hamstring curls If told by your health care provider, do this exercise while wearing ankle weights. Begin with __________lb / kg weights. Then increase the weight by 1 lb (0.5 kg) increments. Do not wear ankle weights that are more than __________lb / kg. Lie on your abdomen with your legs straight. Bend your left / right knee as far as you can without feeling pain. Keep your hips flat against the floor. Hold this position for __________ seconds. Slowly lower your leg to the starting position. Repeat __________ times. Complete this exercise __________ times a day. Squats This exercise strengthens the muscles in front of your thigh and knee (quadriceps). Stand in front of a table, with your feet and knees pointing straight ahead. You may rest your hands on the table for balance but not for support. Slowly bend your knees and lower your hips like you are going to sit in a chair. Keep your weight over your heels, not over your toes. Keep your lower legs upright so they are parallel with the table legs. Do not let your hips go lower than your knees. Do not bend lower than told by your health care provider. If your knee pain increases, do not bend as low. Hold the squat position for __________ seconds. Slowly push with your legs to return to standing. Do not use  your hands to pull yourself to standing. Repeat __________ times. Complete this exercise __________ times a day. Wall slides This exercise strengthens the muscles in front of your thigh and knee (quadriceps). Lean your back against a smooth wall or door, and walk your feet out 18-24 inches (46-61 cm) from it. Place your feet hip-width apart. Slowly slide down the wall or door until your knees bend __________ degrees. Keep your knees over your heels, not over your toes. Keep your knees in line with your hips. Hold this position for __________ seconds. Repeat __________ times. Complete this exercise __________ times a day. Straight leg raises, side-lying This exercise strengthens the muscles that rotate the leg at the hip and move it away from your body (hip abductors). Lie on your side with your left / right leg in the top position. Lie so your head, shoulder, knee, and hip line up. You may bend your bottom knee to help you keep your balance. Roll your hips slightly forward so your hips are stacked directly over each other and your left / right knee is facing forward. Leading with your heel, lift your top leg 4-6 inches (10-15 cm). You should feel the muscles in your outer hip lifting. Do not let your foot drift forward. Do not let your knee roll toward the ceiling. Hold this position for __________ seconds. Slowly return your leg to the starting position. Let your muscles relax completely after each repetition. Repeat __________  times. Complete this exercise __________ times a day. Straight leg raises, prone This exercise stretches the muscles that move your hips away from the front of the pelvis (hip extensors). Lie on your abdomen on a firm surface. You can put a pillow under your hips if that is more comfortable. Tense the muscles in your buttocks and lift your left / right leg about 4-6 inches (10-15 cm). Keep your knee straight as you lift your leg. Hold this position for __________  seconds. Slowly lower your leg to the starting position. Let your leg relax completely after each repetition. Repeat __________ times. Complete this exercise __________ times a day. This information is not intended to replace advice given to you by your health care provider. Make sure you discuss any questions you have with your health care provider. Document Revised: 11/17/2020 Document Reviewed: 11/17/2020 Elsevier Patient Education  [place].

## 2021-04-24 ENCOUNTER — Encounter: Payer: Self-pay | Admitting: Nurse Practitioner

## 2021-04-30 MED FILL — Ferumoxytol Inj 510 MG/17ML (30 MG/ML) (Elemental Fe): INTRAVENOUS | Qty: 17 | Status: AC

## 2021-05-01 ENCOUNTER — Other Ambulatory Visit: Payer: Self-pay

## 2021-05-01 ENCOUNTER — Inpatient Hospital Stay: Payer: Medicaid Other | Attending: Oncology

## 2021-05-01 VITALS — BP 135/65 | HR 63 | Temp 97.6°F | Resp 18

## 2021-05-01 DIAGNOSIS — E538 Deficiency of other specified B group vitamins: Secondary | ICD-10-CM

## 2021-05-01 DIAGNOSIS — N6092 Unspecified benign mammary dysplasia of left breast: Secondary | ICD-10-CM | POA: Diagnosis not present

## 2021-05-01 DIAGNOSIS — Z79899 Other long term (current) drug therapy: Secondary | ICD-10-CM | POA: Insufficient documentation

## 2021-05-01 MED ORDER — SODIUM CHLORIDE 0.9 % IV SOLN: Freq: Once | INTRAVENOUS | Status: AC

## 2021-05-01 MED ORDER — MORPHINE SULFATE (PF) 2 MG/ML IV SOLN
1.0000 mg | Freq: Once | INTRAVENOUS | Status: AC
  Administered 2021-05-01: 1 mg via INTRAVENOUS
  Filled 2021-05-01: qty 1

## 2021-05-01 MED ORDER — METHYLPREDNISOLONE SODIUM SUCC 125 MG IJ SOLR
125.0000 mg | Freq: Once | INTRAMUSCULAR | Status: AC
  Administered 2021-05-01: 125 mg via INTRAVENOUS
  Filled 2021-05-01: qty 2

## 2021-05-01 MED ORDER — SODIUM CHLORIDE 0.9 % IV SOLN
510.0000 mg | Freq: Once | INTRAVENOUS | Status: AC
  Administered 2021-05-01: 510 mg via INTRAVENOUS
  Filled 2021-05-01: qty 17

## 2021-05-01 MED ORDER — SODIUM CHLORIDE 0.9 % IV SOLN
510.0000 mg | Freq: Once | INTRAVENOUS | Status: DC
  Filled 2021-05-01: qty 17

## 2021-05-01 MED ORDER — DIPHENHYDRAMINE HCL 50 MG/ML IJ SOLN
25.0000 mg | Freq: Once | INTRAMUSCULAR | Status: AC
  Administered 2021-05-01: 25 mg via INTRAVENOUS

## 2021-05-01 NOTE — Progress Notes (Signed)
Hypersensitivity Reaction note  Date of event: 05/01/21 Time of event: 1108 Generic name of drug involved: Feraheme, Ferumoxytol. Name of provider notified of the hypersensitivity reaction: Gudena, MD Was agent that likely caused hypersensitivity reaction added to Allergies List within EMR? Yes.  Immediately after starting infusion at 1125 pt began complaining of severe chest pain, SOB, and back pain. Pt appeared flushed. Infusion discontinued. 1L IV fluids started. Pepcid IV given. Solumedrol 125 given. Benadryl 50 given. Pt still complaining of SOB and chest pain. 2 puffs Albuterol inhaler administered. Pt's pain still severe, rated at 8/10.   MD Lindi Adie notified of reaction symptoms, continued pain, and medications administered. MD prescribed Morphine 1mg , Solumedrol 125 mg, Benadryl 25 mg.   Pt symptoms resolved completely within 30 minutes of medication administration. Pt observed until 1240. VSS at time of discharge. Pt had no complaints at time of discharge besides mild fatigue. Pt discharged into the care of her daughter. Pt and MD agreed to discontinue drug permanently.   Rafael Bihari, RN 05/01/2021 12:10 PM

## 2021-05-01 NOTE — Patient Instructions (Signed)

## 2021-05-17 ENCOUNTER — Other Ambulatory Visit: Payer: Self-pay

## 2021-05-17 MED ORDER — TAMOXIFEN CITRATE 20 MG PO TABS: 20.0000 mg | ORAL_TABLET | Freq: Every day | ORAL | 3 refills | Status: DC

## 2021-05-19 ENCOUNTER — Encounter: Payer: Self-pay | Admitting: Oncology

## 2021-05-25 ENCOUNTER — Ambulatory Visit: Payer: Medicaid Other

## 2021-05-25 ENCOUNTER — Ambulatory Visit
Admission: RE | Admit: 2021-05-25 | Discharge: 2021-05-25 | Disposition: A | Discharge status: Home / Self Care | Payer: Medicaid Other | Source: Ambulatory Visit | Attending: Adult Health | Admitting: Adult Health

## 2021-05-25 ENCOUNTER — Encounter: Payer: Self-pay | Admitting: Oncology

## 2021-05-25 DIAGNOSIS — E538 Deficiency of other specified B group vitamins: Secondary | ICD-10-CM

## 2021-05-25 DIAGNOSIS — D6489 Other specified anemias: Secondary | ICD-10-CM

## 2021-05-25 DIAGNOSIS — N6092 Unspecified benign mammary dysplasia of left breast: Secondary | ICD-10-CM

## 2021-09-23 ENCOUNTER — Other Ambulatory Visit: Payer: Self-pay | Admitting: Adult Health

## 2021-09-23 DIAGNOSIS — Z1231 Encounter for screening mammogram for malignant neoplasm of breast: Secondary | ICD-10-CM

## 2021-09-28 ENCOUNTER — Encounter: Payer: Self-pay | Admitting: Oncology

## 2021-10-05 ENCOUNTER — Ambulatory Visit
Admission: RE | Admit: 2021-10-05 | Discharge: 2021-10-05 | Disposition: A | Discharge status: Home / Self Care | Payer: Medicare Other | Source: Ambulatory Visit | Attending: Adult Health | Admitting: Adult Health

## 2021-10-05 DIAGNOSIS — Z1231 Encounter for screening mammogram for malignant neoplasm of breast: Secondary | ICD-10-CM

## 2021-10-18 ENCOUNTER — Inpatient Hospital Stay: Payer: Medicare Other | Attending: Adult Health

## 2021-10-18 ENCOUNTER — Ambulatory Visit (HOSPITAL_BASED_OUTPATIENT_CLINIC_OR_DEPARTMENT_OTHER)
Admission: RE | Admit: 2021-10-18 | Discharge: 2021-10-18 | Disposition: A | Discharge status: Home / Self Care | Payer: Medicare Other | Source: Ambulatory Visit | Attending: Adult Health | Admitting: Adult Health

## 2021-10-18 ENCOUNTER — Other Ambulatory Visit: Payer: Self-pay

## 2021-10-18 ENCOUNTER — Encounter: Payer: Self-pay | Admitting: Adult Health

## 2021-10-18 ENCOUNTER — Inpatient Hospital Stay (HOSPITAL_BASED_OUTPATIENT_CLINIC_OR_DEPARTMENT_OTHER): Payer: Medicare Other | Admitting: Adult Health

## 2021-10-18 VITALS — BP 134/64 | HR 76 | Temp 97.6°F | Resp 16 | Ht 63.0 in | Wt 188.9 lb

## 2021-10-18 DIAGNOSIS — K219 Gastro-esophageal reflux disease without esophagitis: Secondary | ICD-10-CM | POA: Insufficient documentation

## 2021-10-18 DIAGNOSIS — E538 Deficiency of other specified B group vitamins: Secondary | ICD-10-CM | POA: Insufficient documentation

## 2021-10-18 DIAGNOSIS — N6092 Unspecified benign mammary dysplasia of left breast: Secondary | ICD-10-CM

## 2021-10-18 DIAGNOSIS — Z1501 Genetic susceptibility to malignant neoplasm of breast: Secondary | ICD-10-CM | POA: Diagnosis present

## 2021-10-18 DIAGNOSIS — Z79810 Long term (current) use of selective estrogen receptor modulators (SERMs): Secondary | ICD-10-CM | POA: Diagnosis not present

## 2021-10-18 DIAGNOSIS — D509 Iron deficiency anemia, unspecified: Secondary | ICD-10-CM | POA: Diagnosis not present

## 2021-10-18 DIAGNOSIS — M7989 Other specified soft tissue disorders: Secondary | ICD-10-CM | POA: Insufficient documentation

## 2021-10-18 DIAGNOSIS — I10 Essential (primary) hypertension: Secondary | ICD-10-CM | POA: Diagnosis not present

## 2021-10-18 DIAGNOSIS — M8580 Other specified disorders of bone density and structure, unspecified site: Secondary | ICD-10-CM | POA: Insufficient documentation

## 2021-10-18 DIAGNOSIS — D6489 Other specified anemias: Secondary | ICD-10-CM

## 2021-10-18 LAB — CBC WITH DIFFERENTIAL (CANCER CENTER ONLY)
Abs Immature Granulocytes: 0.03 10*3/uL (ref 0.00–0.07)
Basophils Absolute: 0.1 10*3/uL (ref 0.0–0.1)
Basophils Relative: 1 %
Eosinophils Absolute: 0.2 10*3/uL (ref 0.0–0.5)
Eosinophils Relative: 2 %
HCT: 35 % — ABNORMAL LOW (ref 36.0–46.0)
Hemoglobin: 11.3 g/dL — ABNORMAL LOW (ref 12.0–15.0)
Immature Granulocytes: 0 %
Lymphocytes Relative: 21 %
Lymphs Abs: 1.9 10*3/uL (ref 0.7–4.0)
MCH: 26.9 pg (ref 26.0–34.0)
MCHC: 32.3 g/dL (ref 30.0–36.0)
MCV: 83.3 fL (ref 80.0–100.0)
Monocytes Absolute: 0.6 10*3/uL (ref 0.1–1.0)
Monocytes Relative: 6 %
Neutro Abs: 6.3 10*3/uL (ref 1.7–7.7)
Neutrophils Relative %: 70 %
Platelet Count: 283 10*3/uL (ref 150–400)
RBC: 4.2 MIL/uL (ref 3.87–5.11)
RDW: 15.9 % — ABNORMAL HIGH (ref 11.5–15.5)
WBC Count: 9.1 10*3/uL (ref 4.0–10.5)
nRBC: 0 % (ref 0.0–0.2)

## 2021-10-18 LAB — IRON AND IRON BINDING CAPACITY (CC-WL,HP ONLY)
Iron: 63 ug/dL (ref 28–170)
Saturation Ratios: 15 % (ref 10.4–31.8)
TIBC: 430 ug/dL (ref 250–450)
UIBC: 367 ug/dL (ref 148–442)

## 2021-10-18 LAB — CMP (CANCER CENTER ONLY)
ALT: 25 U/L (ref 0–44)
AST: 37 U/L (ref 15–41)
Albumin: 3.7 g/dL (ref 3.5–5.0)
Alkaline Phosphatase: 72 U/L (ref 38–126)
Anion gap: 6 (ref 5–15)
BUN: 11 mg/dL (ref 6–20)
CO2: 25 mmol/L (ref 22–32)
Calcium: 9.5 mg/dL (ref 8.9–10.3)
Chloride: 109 mmol/L (ref 98–111)
Creatinine: 0.65 mg/dL (ref 0.44–1.00)
GFR, Estimated: >60 mL/min (ref >60)
Glucose, Bld: 117 mg/dL — ABNORMAL HIGH (ref 70–99)
Potassium: 4 mmol/L (ref 3.5–5.1)
Sodium: 140 mmol/L (ref 135–145)
Total Bilirubin: 0.4 mg/dL (ref 0.3–1.2)
Total Protein: 7.4 g/dL (ref 6.5–8.1)

## 2021-10-18 LAB — RETIC PANEL
Immature Retic Fract: 22.4 % — ABNORMAL HIGH (ref 2.3–15.9)
RBC.: 4.18 MIL/uL (ref 3.87–5.11)
Retic Count, Absolute: 83.2 10*3/uL (ref 19.0–186.0)
Retic Ct Pct: 2 % (ref 0.4–3.1)
Reticulocyte Hemoglobin: 29.1 pg (ref >27.9)

## 2021-10-18 LAB — VITAMIN B12: Vitamin B-12: 443 pg/mL (ref 180–914)

## 2021-10-18 LAB — FERRITIN: Ferritin: 16 ng/mL (ref 11–307)

## 2021-10-18 NOTE — Assessment & Plan Note (Signed)
She is status post hysterectomy and colonoscopy was normal in 2019.  She received 1 dose of Feraheme in February.  Future doses will need premedication and I reviewed this with her and her son today.  We are waiting on repeat iron levels today.

## 2021-10-18 NOTE — Assessment & Plan Note (Signed)
She has no clinical or radiographic sign of breast cancer today.  She continues on tamoxifen with good tolerance however does have swelling in the left leg worse than the right.  Due to this unilateral worsening I recommended left leg Doppler which will be completed later today.  Her most recent mammogram did not show any evidence of malignancy and she was recommended to continue with annual mammograms next due in July 2024.  She is due to stop tamoxifen in January.  Therefore we will see her back in 6 months for labs and follow-up.

## 2021-10-18 NOTE — Progress Notes (Signed)
Left lower extremity venous duplex has been completed. Preliminary results can be found in CV Proc through chart review.  Results were given to [facility] at [facility] NP office.  10/18/21 3:13 PM Alexandra Beasley RVT

## 2021-10-18 NOTE — Assessment & Plan Note (Signed)
She is taking oral supplementation and daily and B12 level is pending.

## 2021-10-18 NOTE — Progress Notes (Signed)
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 female returns for follow-up of her increased breast cancer risk.  Her most recent mammogram was completed on October 06, 2021 and showed no mammographic evidence of malignancy and breast density category B.  She continues on tamoxifen to reduce her risk of breast cancer.  She tolerates this moderately well, however over the past year she has been experiencing left leg swelling, which is worse at the end of the day.  She did receive IV iron in February and noticed that she had swelling all over after receiving it and felt short of breath.  Since that time she has been feeling well though and has had no new concerns.  Her hemoglobin today is 11.3.  Patient Active Problem List   Diagnosis Date Noted   Iron deficiency anemia 10/18/2021   B12 deficiency 01/09/2020   Plantar fasciitis, bilateral 09/05/2019   Bilateral swelling of feet and ankles 04/27/2019   Other skin changes 04/27/2019   Dizziness 04/27/2019   Gastroesophageal reflux disease without esophagitis 04/27/2019   Osteopenia after menopause 12/18/2017   Essential hypertension 11/16/2016   Atypical lobular  hyperplasia [facility]) of left breast 11/07/2016   Abnormal mammogram of left breast 08/31/2016   Glaucoma 06/04/2015   Pain in joint, shoulder region 06/04/2015    is allergic to feraheme [ferumoxytol].  MEDICAL HISTORY: Past Medical History:  Diagnosis Date   Abnormal mammogram of left breast 08/31/2016   Arthritis    knee   Atypical ductal hyperplasia of left breast    Bilateral lower extremity edema 04/2019   Bilateral lower extremity edema 04/2019   Breast mass, left    GERD (gastroesophageal reflux disease)    Glaucoma    Hyperlipidemia    Hypertension    Other skin changes 04/2019   SVD (spontaneous vaginal delivery)    x 2   Vitamin B12 deficiency    Vitamin D deficiency 10/2019    SURGICAL HISTORY: Past Surgical History:  Procedure Laterality Date   ABDOMINAL HYSTERECTOMY     BREAST EXCISIONAL BIOPSY Left 2018   benign lumpectomy   BREAST LUMPECTOMY WITH RADIOACTIVE SEED LOCALIZATION Left 08/31/2016   Procedure: BREAST LUMPECTOMY WITH RADIOACTIVE SEED LOCALIZATION;  Surgeon: Fanny Skates, MD;  Location: [facility];  Service: General;  Laterality: Left;   CATARACT EXTRACTION, Ramos     patient denies this surgery    SOCIAL HISTORY: Social History   Socioeconomic History   Marital status: Married    Spouse name: Not on file   Number of children: 2   Years of education: Not on file   Highest education level: Not on file  Occupational History   Not  on file  Tobacco Use   Smoking status: Never   Smokeless tobacco: Never  Vaping Use   Vaping Use: Never used  Substance and Sexual Activity   Alcohol use: No    Alcohol/week: 0.0 standard drinks of alcohol   Drug use: No   Sexual activity: Not Currently    Partners: Male    Birth control/protection: Post-menopausal    Comment: hysterectomy  Other Topics Concern   Not on file  Social History Narrative   Not on file   Social Determinants of Health   Financial  Resource Strain: Not on file  Food Insecurity: Not on file  Transportation Needs: Not on file  Physical Activity: Not on file  Stress: Not on file  Social Connections: Not on file  Intimate Partner Violence: Not on file    FAMILY HISTORY: Family History  Problem Relation Age of Onset   Vision loss Mother    Hypertension Mother    Heart disease Father    Colon cancer Neg Hx    Rectal cancer Neg Hx    Stomach cancer Neg Hx     Review of Systems  Constitutional:  Negative for appetite change, chills, fatigue, fever and unexpected weight change.  HENT:   Negative for hearing loss, lump/mass and trouble swallowing.   Eyes:  Negative for eye problems and icterus.  Respiratory:  Negative for chest tightness, cough and shortness of breath.   Cardiovascular:  Negative for chest pain, leg swelling and palpitations.  Gastrointestinal:  Negative for abdominal distention, abdominal pain, constipation, diarrhea, nausea and vomiting.  Endocrine: Negative for hot flashes.  Genitourinary:  Negative for difficulty urinating.   Musculoskeletal:  Negative for arthralgias.  Skin:  Negative for itching and rash.  Neurological:  Negative for dizziness, extremity weakness, headaches and numbness.  Hematological:  Negative for adenopathy. Does not bruise/bleed easily.  Psychiatric/Behavioral:  Negative for depression. The patient is not nervous/anxious.       PHYSICAL EXAMINATION  ECOG PERFORMANCE STATUS: 1 - Symptomatic but completely ambulatory  Vitals:   10/18/21 0951  BP: 134/64  Pulse: 76  Resp: 16  Temp: 97.6 F (36.4 C)  SpO2: 98%    Physical Exam Constitutional:      General: She is not in acute distress.    Appearance: Normal appearance. She is not toxic-appearing.  HENT:     Head: Normocephalic and atraumatic.  Eyes:     General: No scleral icterus. Cardiovascular:     Rate and Rhythm: Normal rate and regular rhythm.     Pulses: Normal pulses.     Heart sounds: Normal  heart sounds.  Pulmonary:     Effort: Pulmonary effort is normal.     Breath sounds: Normal breath sounds.  Chest:     Comments: Left breast status postlumpectomy otherwise benign right breast is benign. Abdominal:     General: Abdomen is flat. Bowel sounds are normal. There is no distension.     Palpations: Abdomen is soft.     Tenderness: There is no abdominal tenderness.  Musculoskeletal:        General: Swelling (Left greater than right) present.     Cervical back: Neck supple.  Lymphadenopathy:     Cervical: No cervical adenopathy.  Skin:    General: Skin is warm and dry.     Findings: No rash.  Neurological:     General: No focal deficit present.     Mental Status: She is alert.  Psychiatric:  Mood and Affect: Mood normal.        Behavior: Behavior normal.     LABORATORY DATA:  CBC    Component Value Date/Time   WBC 9.1 10/18/2021 0934   WBC 8.1 10/08/2020 0947   RBC 4.20 10/18/2021 0934   RBC 4.18 10/18/2021 0934   HGB 11.3 (L) 10/18/2021 0934   HGB 11.3 01/20/2021 1158   HGB 12.0 01/24/2017 1017   HCT 35.0 (L) 10/18/2021 0934   HCT 35.8 01/20/2021 1158   HCT 37.1 01/24/2017 1017   PLT 283 10/18/2021 0934   PLT 210 01/20/2021 1158   MCV 83.3 10/18/2021 0934   MCV 83 01/20/2021 1158   MCV 83.7 01/24/2017 1017   MCH 26.9 10/18/2021 0934   MCHC 32.3 10/18/2021 0934   RDW 15.9 (H) 10/18/2021 0934   RDW 14.5 01/20/2021 1158   RDW 15.1 (H) 01/24/2017 1017   LYMPHSABS 1.9 10/18/2021 0934   LYMPHSABS 1.6 01/20/2021 1158   LYMPHSABS 1.2 01/24/2017 1017   MONOABS 0.6 10/18/2021 0934   MONOABS 0.5 01/24/2017 1017   EOSABS 0.2 10/18/2021 0934   EOSABS 0.2 01/20/2021 1158   BASOSABS 0.1 10/18/2021 0934   BASOSABS 0.0 01/20/2021 1158   BASOSABS 0.1 01/24/2017 1017    CMP     Component Value Date/Time   NA 140 10/18/2021 0934   NA 142 01/20/2021 1158   NA 141 01/24/2017 1018   K 4.0 10/18/2021 0934   K 4.0 01/24/2017 1018   CL 109 10/18/2021 0934    CO2 25 10/18/2021 0934   CO2 25 01/24/2017 1018   GLUCOSE 117 (H) 10/18/2021 0934   GLUCOSE 94 01/24/2017 1018   BUN 11 10/18/2021 0934   BUN 10 01/20/2021 1158   BUN 9.6 01/24/2017 1018   CREATININE 0.65 10/18/2021 0934   CREATININE 0.7 01/24/2017 1018   CALCIUM 9.5 10/18/2021 0934   CALCIUM 9.7 01/24/2017 1018   PROT 7.4 10/18/2021 0934   PROT 6.7 01/20/2021 1158   PROT 7.5 01/24/2017 1018   ALBUMIN 3.7 10/18/2021 0934   ALBUMIN 3.8 01/20/2021 1158   ALBUMIN 3.6 01/24/2017 1018   AST 37 10/18/2021 0934   AST 26 01/24/2017 1018   ALT 25 10/18/2021 0934   ALT 26 01/24/2017 1018   ALKPHOS 72 10/18/2021 0934   ALKPHOS 87 01/24/2017 1018   BILITOT 0.4 10/18/2021 0934   BILITOT 0.40 01/24/2017 1018   GFRNONAA >60 10/18/2021 0934   GFRNONAA >89 07/04/2016 1115   GFRAA 110 04/27/2020 1106   GFRAA >89 07/04/2016 1115      ASSESSMENT and THERAPY PLAN:   B12 deficiency She is taking oral supplementation and daily and B12 level is pending.  Iron deficiency anemia She is status post hysterectomy and colonoscopy was normal in 2019.  She received 1 dose of Feraheme in February.  Future doses will need premedication and I reviewed this with her and her son today.  We are waiting on repeat iron levels today.  Atypical lobular hyperplasia (ALH) of left breast She has no clinical or radiographic sign of breast cancer today.  She continues on tamoxifen with good tolerance however does have swelling in the left leg worse than the right.  Due to this unilateral worsening I recommended left leg Doppler which will be completed later today.  Her most recent mammogram did not show any evidence of malignancy and she was recommended to continue with annual mammograms next due in July 2024.  She is due to stop tamoxifen in  January.  Therefore we will see her back in 6 months for labs and follow-up.  All questions were answered. The patient knows to call the clinic with any problems, questions  or concerns. We can certainly see the patient much sooner if necessary.  Total encounter time:30 minutes*in face-to-face visit time, chart review, lab review, care coordination, order entry, and documentation of the encounter time.  Wilber Bihari, NP 10/18/21 10:22 AM Medical Oncology and Hematology [facility] [place], [place] [address] Tel. [phone]    Fax. [phone]  *Total Encounter Time as defined by the Centers for Medicare and Medicaid Services includes, in addition to the face-to-face time of a patient visit (documented in the note above) non-face-to-face time: obtaining and reviewing outside history, ordering and reviewing medications, tests or procedures, care coordination (communications with other health care professionals or caregivers) and documentation in the medical record.

## 2021-10-18 NOTE — Progress Notes (Signed)
Spoke with Vascular lab with results from doppler this afternoon.  Pt is negative for DVT. Informed Mendel Ryder, NP of results.

## 2021-10-20 ENCOUNTER — Telehealth: Payer: Self-pay

## 2021-10-20 NOTE — Telephone Encounter (Signed)
-----   Message from Gardenia Phlegm, NP sent at 10/20/2021  1:29 PM EDT ----- Please call patient about her labs.  They are good.  No changes at this time  ----- Message ----- From: Interface, Lab In [place] Sent: 10/18/2021   9:46 AM EDT To: Gardenia Phlegm, NP

## 2021-10-20 NOTE — Telephone Encounter (Signed)
Called and s/w pt's daughter, Binni regarding results. She verbalized understanding and knows to call for any further questions/concerns.

## 2021-10-21 ENCOUNTER — Encounter: Payer: Self-pay | Admitting: Nurse Practitioner

## 2021-10-21 ENCOUNTER — Ambulatory Visit: Payer: Medicaid Other | Admitting: Nurse Practitioner

## 2021-10-21 ENCOUNTER — Ambulatory Visit (INDEPENDENT_AMBULATORY_CARE_PROVIDER_SITE_OTHER): Payer: Medicare Other | Admitting: Nurse Practitioner

## 2021-10-21 VITALS — BP 135/78 | HR 69 | Temp 97.5°F | Ht 63.0 in | Wt 189.2 lb

## 2021-10-21 DIAGNOSIS — M25512 Pain in left shoulder: Secondary | ICD-10-CM | POA: Diagnosis not present

## 2021-10-21 DIAGNOSIS — M25552 Pain in left hip: Secondary | ICD-10-CM | POA: Diagnosis not present

## 2021-10-21 DIAGNOSIS — H938X2 Other specified disorders of left ear: Secondary | ICD-10-CM | POA: Diagnosis not present

## 2021-10-21 DIAGNOSIS — G8929 Other chronic pain: Secondary | ICD-10-CM

## 2021-10-21 DIAGNOSIS — I10 Essential (primary) hypertension: Secondary | ICD-10-CM

## 2021-10-21 DIAGNOSIS — H9192 Unspecified hearing loss, left ear: Secondary | ICD-10-CM

## 2021-10-21 MED ORDER — AMLODIPINE BESYLATE 5 MG PO TABS: 5.0000 mg | ORAL_TABLET | Freq: Every day | ORAL | 3 refills | Status: DC

## 2021-10-21 MED ORDER — FLUTICASONE PROPIONATE 50 MCG/ACT NA SUSP: 2.0000 | Freq: Every day | NASAL | 6 refills | Status: DC

## 2021-10-21 MED ORDER — TIZANIDINE HCL 4 MG PO TABS: 4.0000 mg | ORAL_TABLET | Freq: Four times a day (QID) | ORAL | 0 refills | Status: DC | PRN

## 2021-10-21 NOTE — Progress Notes (Signed)
$'@Patient'w$  ID: Alexandra Beasley, female    DOB: 1965-09-19, 56 y.o.   MRN: [id number]  Chief Complaint  Patient presents with   Follow-up    Pt is here for 6 month's HTN follow up. Pt states the LT side of here body has pulling cramping pain    Referring provider: No ref. provider found   HPI  [facility] presents for follow up. She  has a past medical history of Abnormal mammogram of left breast (08/31/2016), Arthritis, Atypical ductal hyperplasia of left breast, Bilateral lower extremity edema (04/2019), Bilateral lower extremity edema (04/2019), Breast mass, left, GERD (gastroesophageal reflux disease), Glaucoma, Hyperlipidemia, Hypertension, Other skin changes (04/2019), SVD (spontaneous vaginal delivery), Vitamin B12 deficiency, and Vitamin D deficiency (10/2019).   Alexandra Beasley is in today for follow up for Hypertension. The current prescribed treatment is Amlodipine 5 mg. Denies headache, dizziness, visual changes, shortness of breath, dyspnea on exertion, chest pain, nausea, vomiting or any edema.   Left shoulder and hip pain. Notices this after sleeping on left side. Also has left hearing loss at night. Patient states this has been an issue for a while now. Will refer to ENT hearing loss. Will try muscle relaxer in the afternoons for this.  Denies f/c/s, n/v/d, hemoptysis, PND, leg swelling Denies chest pain or edema     Allergies  Allergen Reactions   Feraheme [Ferumoxytol] Anaphylaxis    Pt had allergic reaction to [facility] 05/01/2021. See progress notes from 1210. Pt unable to complete infusion.     Immunization History  Administered Date(s) Administered   Influenza,inj,Quad PF,6+ Mos 06/04/2015, 12/28/2015, 01/01/2018, 12/20/2018, 12/30/2019, 01/20/2021   PFIZER(Purple Top)SARS-COV-2 Vaccination 05/29/2019, 06/28/2019, 01/31/2020   Tdap 06/04/2015    Past Medical History:  Diagnosis Date   Abnormal mammogram of left breast 08/31/2016   Arthritis     knee   Atypical ductal hyperplasia of left breast    Bilateral lower extremity edema 04/2019   Bilateral lower extremity edema 04/2019   Breast mass, left    GERD (gastroesophageal reflux disease)    Glaucoma    Hyperlipidemia    Hypertension    Other skin changes 04/2019   SVD (spontaneous vaginal delivery)    x 2   Vitamin B12 deficiency    Vitamin D deficiency 10/2019    Tobacco History: Social History   Tobacco Use  Smoking Status Never  Smokeless Tobacco Never   Counseling given: Not Answered   Outpatient Encounter Medications as of 10/21/2021  Medication Sig   adapalene (DIFFERIN) 0.1 % gel Apply topically.   atorvastatin (LIPITOR) 40 MG tablet TAKE 1 TABLET(40 MG) BY MOUTH DAILY AT 6 PM   Calcium Carbonate-Vitamin D (CALTRATE 600+D PO) Take by mouth.   Cholecalciferol (VITAMIN D3) 1000 units CAPS Take by mouth.   diclofenac Sodium (VOLTAREN) 1 % GEL Apply 4 g topically 4 (four) times daily.   fluticasone (FLONASE) 50 MCG/ACT nasal spray Place 2 sprays into both nostrils daily.   hydroquinone 4 % cream Apply to dark spots only of face once weekly as tolerated.   latanoprost (XALATAN) 0.005 % ophthalmic solution Place 1 drop into both eyes daily.   omeprazole (PRILOSEC) 40 MG capsule Take 1 capsule (40 mg total) by mouth daily.   tamoxifen (NOLVADEX) 20 MG tablet Take 1 tablet (20 mg total) by mouth daily.   tiZANidine (ZANAFLEX) 4 MG tablet Take 1 tablet (4 mg total) by mouth every 6 (six) hours as needed for muscle spasms.   Wheat  Dextrin (BENEFIBER) POWD Take by mouth daily.   [DISCONTINUED] amLODipine (NORVASC) 5 MG tablet Take 1 tablet (5 mg total) by mouth daily.   amLODipine (NORVASC) 5 MG tablet Take 1 tablet (5 mg total) by mouth daily.   meloxicam (MOBIC) 15 MG tablet Take 15 mg by mouth daily. (Patient not taking: Reported on 10/21/2021)   timolol (BETIMOL) 0.5 % ophthalmic solution 1 drop once.   timolol (TIMOPTIC) 0.5 % ophthalmic solution 1 drop every  morning.   vitamin B-12 (CYANOCOBALAMIN) 1000 MCG tablet Take 1 tablet (1,000 mcg total) by mouth daily. (Patient not taking: Reported on 10/18/2021)   No facility-administered encounter medications on file as of 10/21/2021.     Review of Systems  Review of Systems     Physical Exam  BP 135/78 (BP Location: Left Arm, Patient Position: Sitting, Cuff Size: Normal)   Pulse 69   Temp (!) 97.5 F (36.4 C)   Ht '5\' 3"'$  (1.6 m)   Wt 189 lb 4 oz (85.8 kg)   LMP  (LMP Unknown)   SpO2 100%   BMI 33.52 kg/m   Wt Readings from Last 5 Encounters:  10/21/21 189 lb 4 oz (85.8 kg)  10/18/21 188 lb 14.4 oz (85.7 kg)  04/23/21 198 lb 9.6 oz (90.1 kg)  04/19/21 199 lb 1.6 oz (90.3 kg)  01/20/21 195 lb (88.5 kg)     Physical Exam   Lab Results:  CBC    Component Value Date/Time   WBC 9.1 10/18/2021 0934   WBC 8.1 10/08/2020 0947   RBC 4.20 10/18/2021 0934   RBC 4.18 10/18/2021 0934   HGB 11.3 (L) 10/18/2021 0934   HGB 11.3 01/20/2021 1158   HGB 12.0 01/24/2017 1017   HCT 35.0 (L) 10/18/2021 0934   HCT 35.8 01/20/2021 1158   HCT 37.1 01/24/2017 1017   PLT 283 10/18/2021 0934   PLT 210 01/20/2021 1158   MCV 83.3 10/18/2021 0934   MCV 83 01/20/2021 1158   MCV 83.7 01/24/2017 1017   MCH 26.9 10/18/2021 0934   MCHC 32.3 10/18/2021 0934   RDW 15.9 (H) 10/18/2021 0934   RDW 14.5 01/20/2021 1158   RDW 15.1 (H) 01/24/2017 1017   LYMPHSABS 1.9 10/18/2021 0934   LYMPHSABS 1.6 01/20/2021 1158   LYMPHSABS 1.2 01/24/2017 1017   MONOABS 0.6 10/18/2021 0934   MONOABS 0.5 01/24/2017 1017   EOSABS 0.2 10/18/2021 0934   EOSABS 0.2 01/20/2021 1158   BASOSABS 0.1 10/18/2021 0934   BASOSABS 0.0 01/20/2021 1158   BASOSABS 0.1 01/24/2017 1017    BMET    Component Value Date/Time   NA 140 10/18/2021 0934   NA 142 01/20/2021 1158   NA 141 01/24/2017 1018   K 4.0 10/18/2021 0934   K 4.0 01/24/2017 1018   CL 109 10/18/2021 0934   CO2 25 10/18/2021 0934   CO2 25 01/24/2017 1018    GLUCOSE 117 (H) 10/18/2021 0934   GLUCOSE 94 01/24/2017 1018   BUN 11 10/18/2021 0934   BUN 10 01/20/2021 1158   BUN 9.6 01/24/2017 1018   CREATININE 0.65 10/18/2021 0934   CREATININE 0.7 01/24/2017 1018   CALCIUM 9.5 10/18/2021 0934   CALCIUM 9.7 01/24/2017 1018   GFRNONAA >60 10/18/2021 0934   GFRNONAA >89 07/04/2016 1115   GFRAA 110 04/27/2020 1106   GFRAA >89 07/04/2016 1115    BNP No results found for: "BNP"  ProBNP No results found for: "PROBNP"  Imaging: VAS [place] LOWER EXTREMITY VENOUS (DVT)  Result Date: 10/19/2021  Lower Venous DVT Study Patient Name:  Alexandra Beasley [facility]  Date of Exam:   10/18/2021 Medical Rec #: [id number]                  Accession #:    [id number] Date of Birth: Feb 10, 1966                  Patient Gender: F Patient Age:   63 years Exam Location:  [facility] Procedure:      VAS [place] LOWER EXTREMITY VENOUS (DVT) Referring Phys: Mendel Ryder CAUSEY --------------------------------------------------------------------------------  Indications: Swelling.  Risk Factors: Cancer. Comparison Study: No prior studies. Performing Technologist: Oliver Hum RVT  Examination Guidelines: A complete evaluation includes B-mode imaging, spectral Doppler, color Doppler, and power Doppler as needed of all accessible portions of each vessel. Bilateral testing is considered an integral part of a complete examination. Limited examinations for reoccurring indications may be performed as noted. The reflux portion of the exam is performed with the patient in reverse Trendelenburg.  +-----+---------------+---------+-----------+----------+--------------+ RIGHTCompressibilityPhasicitySpontaneityPropertiesThrombus Aging +-----+---------------+---------+-----------+----------+--------------+ CFV  Full           Yes      Yes                                 +-----+---------------+---------+-----------+----------+--------------+    +---------+---------------+---------+-----------+----------+--------------+ LEFT     CompressibilityPhasicitySpontaneityPropertiesThrombus Aging +---------+---------------+---------+-----------+----------+--------------+ CFV      Full           Yes      Yes                                 +---------+---------------+---------+-----------+----------+--------------+ SFJ      Full                                                        +---------+---------------+---------+-----------+----------+--------------+ FV Prox  Full                                                        +---------+---------------+---------+-----------+----------+--------------+ FV Mid   Full                                                        +---------+---------------+---------+-----------+----------+--------------+ FV DistalFull                                                        +---------+---------------+---------+-----------+----------+--------------+ PFV      Full                                                        +---------+---------------+---------+-----------+----------+--------------+  POP      Full           Yes      Yes                                 +---------+---------------+---------+-----------+----------+--------------+ PTV      Full                                                        +---------+---------------+---------+-----------+----------+--------------+ PERO     Full                                                        +---------+---------------+---------+-----------+----------+--------------+    Summary: RIGHT: - No evidence of common femoral vein obstruction.  LEFT: - There is no evidence of deep vein thrombosis in the lower extremity.  - No cystic structure found in the popliteal fossa.  *See table(s) above for measurements and observations. Electronically signed by Orlie Pollen on 10/19/2021 at 12:49:27 PM.    Final    MM 3D SCREEN  BREAST BILATERAL  Result Date: 10/06/2021 CLINICAL DATA:  Screening. EXAM: DIGITAL SCREENING BILATERAL MAMMOGRAM WITH TOMOSYNTHESIS AND CAD TECHNIQUE: Bilateral screening digital craniocaudal and mediolateral oblique mammograms were obtained. Bilateral screening digital breast tomosynthesis was performed. The images were evaluated with computer-aided detection. COMPARISON:  Previous exam(s). ACR Breast Density Category b: There are scattered areas of fibroglandular density. FINDINGS: There are no findings suspicious for malignancy. LEFT breast surgical changes are again noted. IMPRESSION: No mammographic evidence of malignancy. A result letter of this screening mammogram will be mailed directly to the patient. RECOMMENDATION: Screening mammogram in one year. (Code:SM-B-01Y) BI-RADS CATEGORY  2: Benign. Electronically Signed   By: Margarette Canada M.D.   On: 10/06/2021 16:13     Assessment & Plan:   Ear fullness, left - fluticasone (FLONASE) 50 MCG/ACT nasal spray; Place 2 sprays into both nostrils daily.  Dispense: 16 g; Refill: 6 - Ambulatory referral to ENT  2. Left hip pain  - tiZANidine (ZANAFLEX) 4 MG tablet; Take 1 tablet (4 mg total) by mouth every 6 (six) hours as needed for muscle spasms.  Dispense: 30 tablet; Refill: 0  3. Chronic left shoulder pain  - tiZANidine (ZANAFLEX) 4 MG tablet; Take 1 tablet (4 mg total) by mouth every 6 (six) hours as needed for muscle spasms.  Dispense: 30 tablet; Refill: 0  4. Essential hypertension  - amLODipine (NORVASC) 5 MG tablet; Take 1 tablet (5 mg total) by mouth daily.  Dispense: 90 tablet; Refill: 3  5. Decreased hearing, left  - Ambulatory referral to ENT   Follow up:  Follow up in 6 months or sooner if needed     Fenton Foy, NP 10/21/2021

## 2021-10-21 NOTE — Assessment & Plan Note (Signed)
-   fluticasone (FLONASE) 50 MCG/ACT nasal spray; Place 2 sprays into both nostrils daily.  Dispense: 16 g; Refill: 6 - Ambulatory referral to ENT  2. Left hip pain  - tiZANidine (ZANAFLEX) 4 MG tablet; Take 1 tablet (4 mg total) by mouth every 6 (six) hours as needed for muscle spasms.  Dispense: 30 tablet; Refill: 0  3. Chronic left shoulder pain  - tiZANidine (ZANAFLEX) 4 MG tablet; Take 1 tablet (4 mg total) by mouth every 6 (six) hours as needed for muscle spasms.  Dispense: 30 tablet; Refill: 0  4. Essential hypertension  - amLODipine (NORVASC) 5 MG tablet; Take 1 tablet (5 mg total) by mouth daily.  Dispense: 90 tablet; Refill: 3  5. Decreased hearing, left  - Ambulatory referral to ENT   Follow up:  Follow up in 6 months or sooner if needed

## 2021-10-21 NOTE — Patient Instructions (Addendum)
1. Ear fullness, left  - fluticasone (FLONASE) 50 MCG/ACT nasal spray; Place 2 sprays into both nostrils daily.  Dispense: 16 g; Refill: 6 - Ambulatory referral to ENT  2. Left hip pain  - tiZANidine (ZANAFLEX) 4 MG tablet; Take 1 tablet (4 mg total) by mouth every 6 (six) hours as needed for muscle spasms.  Dispense: 30 tablet; Refill: 0  3. Chronic left shoulder pain  - tiZANidine (ZANAFLEX) 4 MG tablet; Take 1 tablet (4 mg total) by mouth every 6 (six) hours as needed for muscle spasms.  Dispense: 30 tablet; Refill: 0  4. Essential hypertension  - amLODipine (NORVASC) 5 MG tablet; Take 1 tablet (5 mg total) by mouth daily.  Dispense: 90 tablet; Refill: 3  5. Decreased hearing, left  - Ambulatory referral to ENT   Follow up:  Follow up in 6 months or sooner if needed

## 2021-10-22 ENCOUNTER — Other Ambulatory Visit: Payer: Self-pay | Admitting: Nurse Practitioner

## 2021-10-22 DIAGNOSIS — G8929 Other chronic pain: Secondary | ICD-10-CM

## 2021-10-22 MED ORDER — BACLOFEN 10 MG PO TABS: 10.0000 mg | ORAL_TABLET | Freq: Three times a day (TID) | ORAL | 1 refills | Status: AC | PRN

## 2021-10-29 ENCOUNTER — Other Ambulatory Visit: Payer: Self-pay | Admitting: Nurse Practitioner

## 2021-10-29 DIAGNOSIS — M25552 Pain in left hip: Secondary | ICD-10-CM

## 2021-10-29 DIAGNOSIS — G8929 Other chronic pain: Secondary | ICD-10-CM

## 2021-11-09 ENCOUNTER — Telehealth: Payer: Self-pay

## 2021-11-09 NOTE — Telephone Encounter (Signed)
Tizanidine

## 2021-11-10 ENCOUNTER — Other Ambulatory Visit: Payer: Self-pay | Admitting: Nurse Practitioner

## 2021-11-10 MED ORDER — TIZANIDINE HCL 4 MG PO TABS: 4.0000 mg | ORAL_TABLET | Freq: Three times a day (TID) | ORAL | 1 refills | Status: AC

## 2021-12-02 ENCOUNTER — Ambulatory Visit: Payer: Medicare Other | Admitting: Nurse Practitioner

## 2021-12-25 ENCOUNTER — Other Ambulatory Visit: Payer: Self-pay | Admitting: Nurse Practitioner

## 2021-12-25 DIAGNOSIS — I10 Essential (primary) hypertension: Secondary | ICD-10-CM

## 2022-01-05 ENCOUNTER — Ambulatory Visit
Admission: EM | Admit: 2022-01-05 | Discharge: 2022-01-05 | Disposition: A | Discharge status: Home / Self Care | Payer: Medicare Other | Attending: Urgent Care | Admitting: Urgent Care

## 2022-01-05 ENCOUNTER — Ambulatory Visit (INDEPENDENT_AMBULATORY_CARE_PROVIDER_SITE_OTHER): Payer: Medicare Other

## 2022-01-05 DIAGNOSIS — M545 Low back pain, unspecified: Secondary | ICD-10-CM

## 2022-01-05 DIAGNOSIS — M47816 Spondylosis without myelopathy or radiculopathy, lumbar region: Secondary | ICD-10-CM | POA: Diagnosis not present

## 2022-01-05 MED ORDER — TRIAMCINOLONE ACETONIDE 40 MG/ML IJ SUSP
40.0000 mg | Freq: Once | INTRAMUSCULAR | Status: AC
Start: 2022-01-05 — End: 2022-01-05
  Administered 2022-01-05: 40 mg via INTRAMUSCULAR

## 2022-01-05 NOTE — ED Triage Notes (Signed)
Pt states right lower back pain since last night, denies injury.  States pain is worse when she is walking.

## 2022-01-05 NOTE — Discharge Instructions (Addendum)
Please make sure you request a consultation with the spine specialist. They can pursue more studies like an MRI of the spine should your back pain continue to hurt you. You can also follow up with your regular doctor.

## 2022-01-05 NOTE — ED Provider Notes (Signed)
[facility]  Note:  This document was prepared using Systems analyst and may include unintentional dictation errors.  MRN: [id number] DOB: 24-Jan-1966  Subjective:   Alexandra Beasley is a 56 y.o. female presenting for 1 day history of acute onset persistent low back pain along her midline that radiates to her buttock.  Reports that the pain is severe, has difficulty walking from the pain. No fall, trauma, numbness or tingling, saddle paresthesia, changes to bowel or urinary habits, radicular symptoms.  No history of musculoskeletal disorders.  No history of diabetes.  No current facility-administered medications for this encounter.  Current Outpatient Medications:    adapalene (DIFFERIN) 0.1 % gel, Apply topically., Disp: , Rfl:    amLODipine (NORVASC) 5 MG tablet, TAKE 1 TABLET(5 MG) BY MOUTH DAILY, Disp: 90 tablet, Rfl: 3   atorvastatin (LIPITOR) 40 MG tablet, TAKE 1 TABLET(40 MG) BY MOUTH DAILY AT 6 PM, Disp: 90 tablet, Rfl: 3   baclofen (LIORESAL) 10 MG tablet, Take 1 tablet (10 mg total) by mouth 3 (three) times daily as needed for muscle spasms., Disp: 90 tablet, Rfl: 1   Calcium Carbonate-Vitamin D (CALTRATE 600+D PO), Take by mouth., Disp: , Rfl:    Cholecalciferol (VITAMIN D3) 1000 units CAPS, Take by mouth., Disp: , Rfl:    diclofenac Sodium (VOLTAREN) 1 % GEL, Apply 4 g topically 4 (four) times daily., Disp: 350 g, Rfl: 11   fluticasone (FLONASE) 50 MCG/ACT nasal spray, Place 2 sprays into both nostrils daily., Disp: 16 g, Rfl: 6   hydroquinone 4 % cream, Apply to dark spots only of face once weekly as tolerated., Disp: , Rfl:    latanoprost (XALATAN) 0.005 % ophthalmic solution, Place 1 drop into both eyes daily., Disp: , Rfl:    meloxicam (MOBIC) 15 MG tablet, Take 15 mg by mouth daily. (Patient not taking: Reported on 10/21/2021), Disp: , Rfl:    omeprazole (PRILOSEC) 40 MG capsule, Take 1 capsule (40 mg total) by mouth daily.,  Disp: 90 capsule, Rfl: 3   tamoxifen (NOLVADEX) 20 MG tablet, Take 1 tablet (20 mg total) by mouth daily., Disp: 90 tablet, Rfl: 3   timolol (BETIMOL) 0.5 % ophthalmic solution, 1 drop once., Disp: , Rfl:    timolol (TIMOPTIC) 0.5 % ophthalmic solution, 1 drop every morning., Disp: , Rfl:    tiZANidine (ZANAFLEX) 4 MG tablet, Take 1 tablet (4 mg total) by mouth 3 (three) times daily., Disp: 90 tablet, Rfl: 1   vitamin B-12 (CYANOCOBALAMIN) 1000 MCG tablet, Take 1 tablet (1,000 mcg total) by mouth daily. (Patient not taking: Reported on 10/18/2021), Disp: 90 tablet, Rfl: 3   Wheat Dextrin (BENEFIBER) POWD, Take by mouth daily., Disp: , Rfl:    Allergies  Allergen Reactions   Feraheme [Ferumoxytol] Anaphylaxis    Pt had allergic reaction to [facility] 05/01/2021. See progress notes from 1210. Pt unable to complete infusion.     Past Medical History:  Diagnosis Date   Abnormal mammogram of left breast 08/31/2016   Arthritis    knee   Atypical ductal hyperplasia of left breast    Bilateral lower extremity edema 04/2019   Bilateral lower extremity edema 04/2019   Breast mass, left    GERD (gastroesophageal reflux disease)    Glaucoma    Hyperlipidemia    Hypertension    Other skin changes 04/2019   SVD (spontaneous vaginal delivery)    x 2   Vitamin B12 deficiency  Vitamin D deficiency 10/2019     Past Surgical History:  Procedure Laterality Date   ABDOMINAL HYSTERECTOMY     BREAST EXCISIONAL BIOPSY Left 2018   benign lumpectomy   BREAST LUMPECTOMY WITH RADIOACTIVE SEED LOCALIZATION Left 08/31/2016   Procedure: BREAST LUMPECTOMY WITH RADIOACTIVE SEED LOCALIZATION;  Surgeon: Fanny Skates, MD;  Location: [facility];  Service: General;  Laterality: Left;   CATARACT EXTRACTION, BILATERAL     CESAREAN SECTION     patient denies this surgery    Family History  Problem Relation Age of Onset   Vision loss Mother    Hypertension Mother    Heart disease Father     Colon cancer Neg Hx    Rectal cancer Neg Hx    Stomach cancer Neg Hx     Social History   Tobacco Use   Smoking status: Never   Smokeless tobacco: Never  Vaping Use   Vaping Use: Never used  Substance Use Topics   Alcohol use: No    Alcohol/week: 0.0 standard drinks of alcohol   Drug use: No    ROS   Objective:   Vitals: BP 131/84 (BP Location: Right Arm)   Pulse 81   Temp 98 F (36.7 C) (Oral)   Resp 16   LMP  (LMP Unknown)   SpO2 97%   Physical Exam Constitutional:      General: She is not in acute distress.    Appearance: Normal appearance. She is well-developed. She is not ill-appearing, toxic-appearing or diaphoretic.  HENT:     Head: Normocephalic and atraumatic.     Nose: Nose normal.     Mouth/Throat:     Mouth: Mucous membranes are moist.  Eyes:     General: No scleral icterus.       Right eye: No discharge.        Left eye: No discharge.     Extraocular Movements: Extraocular movements intact.  Cardiovascular:     Rate and Rhythm: Normal rate.  Pulmonary:     Effort: Pulmonary effort is normal.  Musculoskeletal:     Lumbar back: Tenderness (over area outlined) present. No swelling, edema, deformity, signs of trauma, lacerations, spasms or bony tenderness. Normal range of motion. Negative right straight leg raise test and negative left straight leg raise test. No scoliosis.       Back:  Skin:    General: Skin is warm and dry.  Neurological:     General: No focal deficit present.     Mental Status: She is alert and oriented to person, place, and time.     Motor: No weakness.     Coordination: Coordination normal.     Gait: Gait normal.     Deep Tendon Reflexes: Reflexes normal.  Psychiatric:        Mood and Affect: Mood normal.        Behavior: Behavior normal.    DG Lumbar Spine Complete  Result Date: 01/05/2022 CLINICAL DATA:  Right lower back pain.  Denies injury. EXAM: LUMBAR SPINE - COMPLETE 4+ VIEW COMPARISON:  None Available.  FINDINGS: There are 5 non-rib-bearing lumbar-type vertebral bodies. Normal frontal alignment. There is mild straightening of the normal lumbar lordosis. No sagittal spondylolisthesis. No significant disc space narrowing. Mild L5-S1 facet joint arthropathy. IMPRESSION: Mild L5-S1 facet joint arthropathy. No significant disc space narrowing. Electronically Signed   By: Yvonne Kendall M.D.   On: 01/05/2022 16:22     Assessment and Plan :  PDMP not reviewed this encounter.  1. Acute midline low back pain without sciatica   2. Facet arthropathy, lumbar     Patient and her family prefer injectable steroid as she has done well with this in the past as opposed to oral steroids. IM triamcinolone in clinic at '40mg'$ . Emphasized need for follow up with [facility] clinic for a recheck and consideration for an MRI. Otherwise, no signs of an acute spinal emergency. Counseled patient on potential for adverse effects with medications prescribed/recommended today, ER and return-to-clinic precautions discussed, patient verbalized understanding.    Jaynee Eagles, PA-C 01/05/22 1655

## 2022-01-07 ENCOUNTER — Encounter: Payer: Self-pay | Admitting: Nurse Practitioner

## 2022-01-07 ENCOUNTER — Ambulatory Visit (INDEPENDENT_AMBULATORY_CARE_PROVIDER_SITE_OTHER): Payer: Medicare Other | Admitting: Nurse Practitioner

## 2022-01-07 VITALS — BP 138/75 | HR 69 | Ht 63.0 in | Wt 192.0 lb

## 2022-01-07 DIAGNOSIS — M545 Low back pain, unspecified: Secondary | ICD-10-CM

## 2022-01-07 DIAGNOSIS — M129 Arthropathy, unspecified: Secondary | ICD-10-CM

## 2022-01-07 MED ORDER — DICLOFENAC SODIUM 75 MG PO TBEC: 75.0000 mg | DELAYED_RELEASE_TABLET | Freq: Two times a day (BID) | ORAL | 0 refills | Status: DC

## 2022-01-07 MED ORDER — PREDNISONE 20 MG PO TABS: 20.0000 mg | ORAL_TABLET | Freq: Every day | ORAL | 0 refills | Status: AC

## 2022-01-07 NOTE — Assessment & Plan Note (Signed)
-   predniSONE (DELTASONE) 20 MG tablet; Take 1 tablet (20 mg total) by mouth daily with breakfast for 5 days.  Dispense: 5 tablet; Refill: 0 - diclofenac (VOLTAREN) 75 MG EC tablet; Take 1 tablet (75 mg total) by mouth 2 (two) times daily.  Dispense: 30 tablet; Refill: 0  2. Acute midline low back pain without sciatica  - predniSONE (DELTASONE) 20 MG tablet; Take 1 tablet (20 mg total) by mouth daily with breakfast for 5 days.  Dispense: 5 tablet; Refill: 0 - diclofenac (VOLTAREN) 75 MG EC tablet; Take 1 tablet (75 mg total) by mouth 2 (two) times daily.  Dispense: 30 tablet; Refill: 0   Follow up:  Follow up in 3 months or sooner

## 2022-01-07 NOTE — Progress Notes (Signed)
Patient and son stated that she went to the urgent care and received a steroid shot as well

## 2022-01-07 NOTE — Progress Notes (Signed)
$'@Patient'j$  ID: Alexandra Beasley, female    DOB: 02-27-1966, 56 y.o.   MRN: [id number]  Chief Complaint  Patient presents with   Back Pain    Referring provider: Fenton Foy, NP  HPI  Alexandra Beasley presents for follow up. She  has a past medical history of Abnormal mammogram of left breast (08/31/2016), Arthritis, Atypical ductal hyperplasia of left breast, Bilateral lower extremity edema (04/2019), Bilateral lower extremity edema (04/2019), Breast mass, left, GERD (gastroesophageal reflux disease), Glaucoma, Hyperlipidemia, Hypertension, Other skin changes (04/2019), SVD (spontaneous vaginal delivery), Vitamin B12 deficiency, and Vitamin D deficiency (10/2019).   Patient presents today for ED follow-up.  Patient was seen in the ED on 01/05/2022 for low back pain.  She has a history of chronic back pain but had an acute flare on 01/05/2022.  She was treated with steroid shot.  She states that the pain has improved but is still located in her lower back.  She did have x-rays that were abnormal and the ED physician suggested that she be referred to neurosurgery for further evaluation and possible MRI.  We will place this referral for her today. Denies f/c/s, n/v/d, hemoptysis, PND, leg swelling Denies chest pain or edema        Allergies  Allergen Reactions   Feraheme [Ferumoxytol] Anaphylaxis    Pt had allergic reaction to [facility] 05/01/2021. See progress notes from 1210. Pt unable to complete infusion.     Immunization History  Administered Date(s) Administered   Influenza,inj,Quad PF,6+ Mos 06/04/2015, 12/28/2015, 01/01/2018, 12/20/2018, 12/30/2019, 01/20/2021   PFIZER(Purple Top)SARS-COV-2 Vaccination 05/29/2019, 06/28/2019, 01/31/2020   Tdap 06/04/2015    Past Medical History:  Diagnosis Date   Abnormal mammogram of left breast 08/31/2016   Arthritis    knee   Atypical ductal hyperplasia of left breast    Bilateral lower extremity edema 04/2019    Bilateral lower extremity edema 04/2019   Breast mass, left    GERD (gastroesophageal reflux disease)    Glaucoma    Hyperlipidemia    Hypertension    Other skin changes 04/2019   SVD (spontaneous vaginal delivery)    x 2   Vitamin B12 deficiency    Vitamin D deficiency 10/2019    Tobacco History: Social History   Tobacco Use  Smoking Status Never  Smokeless Tobacco Never   Counseling given: Not Answered   Outpatient Encounter Medications as of 01/07/2022  Medication Sig   adapalene (DIFFERIN) 0.1 % gel Apply topically.   amLODipine (NORVASC) 5 MG tablet TAKE 1 TABLET(5 MG) BY MOUTH DAILY   atorvastatin (LIPITOR) 40 MG tablet TAKE 1 TABLET(40 MG) BY MOUTH DAILY AT 6 PM   baclofen (LIORESAL) 10 MG tablet Take 1 tablet (10 mg total) by mouth 3 (three) times daily as needed for muscle spasms.   Calcium Carbonate-Vitamin D (CALTRATE 600+D PO) Take by mouth.   Cholecalciferol (VITAMIN D3) 1000 units CAPS Take by mouth.   diclofenac (VOLTAREN) 75 MG EC tablet Take 1 tablet (75 mg total) by mouth 2 (two) times daily.   diclofenac Sodium (VOLTAREN) 1 % GEL Apply 4 g topically 4 (four) times daily.   fluticasone (FLONASE) 50 MCG/ACT nasal spray Place 2 sprays into both nostrils daily.   hydroquinone 4 % cream Apply to dark spots only of face once weekly as tolerated.   latanoprost (XALATAN) 0.005 % ophthalmic solution Place 1 drop into both eyes daily.   meloxicam (MOBIC) 15 MG tablet Take 15 mg by mouth daily.  omeprazole (PRILOSEC) 40 MG capsule Take 1 capsule (40 mg total) by mouth daily.   predniSONE (DELTASONE) 20 MG tablet Take 1 tablet (20 mg total) by mouth daily with breakfast for 5 days.   tamoxifen (NOLVADEX) 20 MG tablet Take 1 tablet (20 mg total) by mouth daily.   timolol (BETIMOL) 0.5 % ophthalmic solution 1 drop once.   timolol (TIMOPTIC) 0.5 % ophthalmic solution 1 drop every morning.   tiZANidine (ZANAFLEX) 4 MG tablet Take 1 tablet (4 mg total) by mouth 3 (three)  times daily.   vitamin B-12 (CYANOCOBALAMIN) 1000 MCG tablet Take 1 tablet (1,000 mcg total) by mouth daily.   Wheat Dextrin (BENEFIBER) POWD Take by mouth daily.   No facility-administered encounter medications on file as of 01/07/2022.     Review of Systems  Review of Systems  Constitutional: Negative.   HENT: Negative.    Cardiovascular: Negative.   Gastrointestinal: Negative.   Musculoskeletal:  Positive for back pain.  Allergic/Immunologic: Negative.   Neurological: Negative.   Psychiatric/Behavioral: Negative.         Physical Exam  BP 138/75   Pulse 69   Ht '5\' 3"'$  (1.6 m)   Wt 192 lb (87.1 kg)   LMP  (LMP Unknown)   SpO2 96%   BMI 34.01 kg/m   Wt Readings from Last 5 Encounters:  01/07/22 192 lb (87.1 kg)  10/21/21 189 lb 4 oz (85.8 kg)  10/18/21 188 lb 14.4 oz (85.7 kg)  04/23/21 198 lb 9.6 oz (90.1 kg)  04/19/21 199 lb 1.6 oz (90.3 kg)     Physical Exam Vitals and nursing note reviewed.  Constitutional:      General: She is not in acute distress.    Appearance: She is well-developed.  Cardiovascular:     Rate and Rhythm: Normal rate and regular rhythm.  Pulmonary:     Effort: Pulmonary effort is normal.     Breath sounds: Normal breath sounds.  Musculoskeletal:     Cervical back: No swelling or tenderness. Normal range of motion.       Back:  Neurological:     Mental Status: She is alert and oriented to person, place, and time.      Lab Results:  CBC    Component Value Date/Time   WBC 9.1 10/18/2021 0934   WBC 8.1 10/08/2020 0947   RBC 4.20 10/18/2021 0934   RBC 4.18 10/18/2021 0934   HGB 11.3 (L) 10/18/2021 0934   HGB 11.3 01/20/2021 1158   HGB 12.0 01/24/2017 1017   HCT 35.0 (L) 10/18/2021 0934   HCT 35.8 01/20/2021 1158   HCT 37.1 01/24/2017 1017   PLT 283 10/18/2021 0934   PLT 210 01/20/2021 1158   MCV 83.3 10/18/2021 0934   MCV 83 01/20/2021 1158   MCV 83.7 01/24/2017 1017   MCH 26.9 10/18/2021 0934   MCHC 32.3  10/18/2021 0934   RDW 15.9 (H) 10/18/2021 0934   RDW 14.5 01/20/2021 1158   RDW 15.1 (H) 01/24/2017 1017   LYMPHSABS 1.9 10/18/2021 0934   LYMPHSABS 1.6 01/20/2021 1158   LYMPHSABS 1.2 01/24/2017 1017   MONOABS 0.6 10/18/2021 0934   MONOABS 0.5 01/24/2017 1017   EOSABS 0.2 10/18/2021 0934   EOSABS 0.2 01/20/2021 1158   BASOSABS 0.1 10/18/2021 0934   BASOSABS 0.0 01/20/2021 1158   BASOSABS 0.1 01/24/2017 1017    BMET    Component Value Date/Time   NA 140 10/18/2021 0934   NA 142 01/20/2021 1158  NA 141 01/24/2017 1018   K 4.0 10/18/2021 0934   K 4.0 01/24/2017 1018   CL 109 10/18/2021 0934   CO2 25 10/18/2021 0934   CO2 25 01/24/2017 1018   GLUCOSE 117 (H) 10/18/2021 0934   GLUCOSE 94 01/24/2017 1018   BUN 11 10/18/2021 0934   BUN 10 01/20/2021 1158   BUN 9.6 01/24/2017 1018   CREATININE 0.65 10/18/2021 0934   CREATININE 0.7 01/24/2017 1018   CALCIUM 9.5 10/18/2021 0934   CALCIUM 9.7 01/24/2017 1018   GFRNONAA >60 10/18/2021 0934   GFRNONAA >89 07/04/2016 1115   GFRAA 110 04/27/2020 1106   GFRAA >89 07/04/2016 1115    BNP No results found for: "BNP"  ProBNP No results found for: "PROBNP"  Imaging: DG Lumbar Spine Complete  Result Date: 01/05/2022 CLINICAL DATA:  Right lower back pain.  Denies injury. EXAM: LUMBAR SPINE - COMPLETE 4+ VIEW COMPARISON:  None Available. FINDINGS: There are 5 non-rib-bearing lumbar-type vertebral bodies. Normal frontal alignment. There is mild straightening of the normal lumbar lordosis. No sagittal spondylolisthesis. No significant disc space narrowing. Mild L5-S1 facet joint arthropathy. IMPRESSION: Mild L5-S1 facet joint arthropathy. No significant disc space narrowing. Electronically Signed   By: Yvonne Kendall M.D.   On: 01/05/2022 16:22     Assessment & Plan:   Arthropathy - predniSONE (DELTASONE) 20 MG tablet; Take 1 tablet (20 mg total) by mouth daily with breakfast for 5 days.  Dispense: 5 tablet; Refill: 0 - diclofenac  (VOLTAREN) 75 MG EC tablet; Take 1 tablet (75 mg total) by mouth 2 (two) times daily.  Dispense: 30 tablet; Refill: 0  2. Acute midline low back pain without sciatica  - predniSONE (DELTASONE) 20 MG tablet; Take 1 tablet (20 mg total) by mouth daily with breakfast for 5 days.  Dispense: 5 tablet; Refill: 0 - diclofenac (VOLTAREN) 75 MG EC tablet; Take 1 tablet (75 mg total) by mouth 2 (two) times daily.  Dispense: 30 tablet; Refill: 0   Follow up:  Follow up in 3 months or sooner     Fenton Foy, NP 01/07/2022

## 2022-01-07 NOTE — Patient Instructions (Signed)
1. Arthropathy  - predniSONE (DELTASONE) 20 MG tablet; Take 1 tablet (20 mg total) by mouth daily with breakfast for 5 days.  Dispense: 5 tablet; Refill: 0 - diclofenac (VOLTAREN) 75 MG EC tablet; Take 1 tablet (75 mg total) by mouth 2 (two) times daily.  Dispense: 30 tablet; Refill: 0  2. Acute midline low back pain without sciatica  - predniSONE (DELTASONE) 20 MG tablet; Take 1 tablet (20 mg total) by mouth daily with breakfast for 5 days.  Dispense: 5 tablet; Refill: 0 - diclofenac (VOLTAREN) 75 MG EC tablet; Take 1 tablet (75 mg total) by mouth 2 (two) times daily.  Dispense: 30 tablet; Refill: 0   Follow up:  Follow up in 3 months or sooner

## 2022-01-20 ENCOUNTER — Other Ambulatory Visit: Payer: Self-pay | Admitting: Nurse Practitioner

## 2022-01-20 DIAGNOSIS — M129 Arthropathy, unspecified: Secondary | ICD-10-CM

## 2022-01-20 DIAGNOSIS — M545 Low back pain, unspecified: Secondary | ICD-10-CM

## 2022-03-08 ENCOUNTER — Other Ambulatory Visit: Payer: Self-pay | Admitting: Nurse Practitioner

## 2022-03-08 DIAGNOSIS — M2550 Pain in unspecified joint: Secondary | ICD-10-CM

## 2022-03-10 NOTE — Telephone Encounter (Signed)
Please advise KH 

## 2022-03-24 ENCOUNTER — Other Ambulatory Visit: Payer: Self-pay | Admitting: Adult Health

## 2022-03-24 ENCOUNTER — Other Ambulatory Visit: Payer: Self-pay | Admitting: Nurse Practitioner

## 2022-03-24 DIAGNOSIS — K219 Gastro-esophageal reflux disease without esophagitis: Secondary | ICD-10-CM

## 2022-03-24 DIAGNOSIS — E785 Hyperlipidemia, unspecified: Secondary | ICD-10-CM

## 2022-04-11 ENCOUNTER — Ambulatory Visit: Payer: 59 | Admitting: Nurse Practitioner

## 2022-04-11 ENCOUNTER — Encounter: Payer: Self-pay | Admitting: Nurse Practitioner

## 2022-04-11 VITALS — BP 118/56 | HR 80 | Temp 97.3°F | Resp 16 | Ht 60.0 in | Wt 196.8 lb

## 2022-04-11 DIAGNOSIS — Z23 Encounter for immunization: Secondary | ICD-10-CM | POA: Diagnosis not present

## 2022-04-11 DIAGNOSIS — Z13220 Encounter for screening for lipoid disorders: Secondary | ICD-10-CM

## 2022-04-11 DIAGNOSIS — I10 Essential (primary) hypertension: Secondary | ICD-10-CM

## 2022-04-11 NOTE — Patient Instructions (Signed)
1. Essential hypertension  - CBC - Comprehensive metabolic panel - Lipid Panel  2. Lipid screening  - Lipid Panel  3. Flu vaccine need  - Flu Vaccine QUAD 56moIM (Fluarix, Fluzone & Alfiuria Quad PF)  Follow up:  Follow up in 6 months or sooner if needed

## 2022-04-11 NOTE — Progress Notes (Signed)
$'@Patient'B$  ID: Alexandra Beasley, female    DOB: [birth year], 57 y.o.   MRN: [id number]  Chief Complaint  Patient presents with   Follow-up    Still having pain in legs    Referring provider: Fenton Foy, NP  HPI  Alexandra Beasley presents for follow up. She  has a past medical history of Abnormal mammogram of left breast (08/31/2016), Arthritis, Atypical ductal hyperplasia of left breast, Bilateral lower extremity edema (04/2019), Bilateral lower extremity edema (04/2019), Breast mass, left, GERD (gastroesophageal reflux disease), Glaucoma, Hyperlipidemia, Hypertension, Other skin changes (04/2019), SVD (spontaneous vaginal delivery), Vitamin B12 deficiency, and Vitamin D deficiency (10/2019).     Patient presents today for follow-up visit.  Patient was seen in the ED on 01/05/2022 for low back pain.  She has a history of chronic back pain but had an acute flare on 01/05/2022.  She was treated with steroid shot.  She states that the pain has improved but is still located in her lower back.  She did have x-rays that were abnormal and the ED physician suggested that she be referred to neurosurgery for further evaluation and possible MRI. This referral was placed at last visit but no appointment was made. Will have referral coordinator check into this. Denies f/c/s, n/v/d, hemoptysis, PND, leg swelling Denies chest pain or edema      Allergies  Allergen Reactions   Feraheme [Ferumoxytol] Anaphylaxis    Pt had allergic reaction to [facility] 05/01/2021. See progress notes from 1210. Pt unable to complete infusion.     Immunization History  Administered Date(s) Administered   Influenza,inj,Quad PF,6+ Mos 06/04/2015, 12/28/2015, 01/01/2018, 12/20/2018, 12/30/2019, 01/20/2021, 04/11/2022   PFIZER(Purple Top)SARS-COV-2 Vaccination 05/29/2019, 06/28/2019, 01/31/2020   Tdap 06/04/2015    Past Medical History:  Diagnosis Date   Abnormal mammogram of left breast 08/31/2016    Arthritis    knee   Atypical ductal hyperplasia of left breast    Bilateral lower extremity edema 04/2019   Bilateral lower extremity edema 04/2019   Breast mass, left    GERD (gastroesophageal reflux disease)    Glaucoma    Hyperlipidemia    Hypertension    Other skin changes 04/2019   SVD (spontaneous vaginal delivery)    x 2   Vitamin B12 deficiency    Vitamin D deficiency 10/2019    Tobacco History: Social History   Tobacco Use  Smoking Status Never  Smokeless Tobacco Never   Counseling given: Not Answered   Outpatient Encounter Medications as of 04/11/2022  Medication Sig   adapalene (DIFFERIN) 0.1 % gel Apply topically.   amLODipine (NORVASC) 5 MG tablet TAKE 1 TABLET(5 MG) BY MOUTH DAILY   atorvastatin (LIPITOR) 40 MG tablet TAKE 1 TABLET(40 MG) BY MOUTH DAILY AT 6 PM   baclofen (LIORESAL) 10 MG tablet Take 1 tablet (10 mg total) by mouth 3 (three) times daily as needed for muscle spasms.   Calcium Carbonate-Vitamin D (CALTRATE 600+D PO) Take by mouth.   Cholecalciferol (VITAMIN D3) 1000 units CAPS Take by mouth.   diclofenac (VOLTAREN) 75 MG EC tablet Take 1 tablet (75 mg total) by mouth 2 (two) times daily.   diclofenac Sodium (VOLTAREN) 1 % GEL APPLY 4 GRAMS TOPICALLY TO THE AFFECTED AREA FOUR TIMES DAILY   fluticasone (FLONASE) 50 MCG/ACT nasal spray Place 2 sprays into both nostrils daily.   hydroquinone 4 % cream Apply to dark spots only of face once weekly as tolerated.   latanoprost (XALATAN) 0.005 % ophthalmic  solution Place 1 drop into both eyes daily.   meloxicam (MOBIC) 15 MG tablet Take 15 mg by mouth daily.   omeprazole (PRILOSEC) 40 MG capsule TAKE 1 CAPSULE(40 MG) BY MOUTH DAILY   tamoxifen (NOLVADEX) 20 MG tablet TAKE 1 TABLET(20 MG) BY MOUTH DAILY   timolol (BETIMOL) 0.5 % ophthalmic solution 1 drop once.   timolol (TIMOPTIC) 0.5 % ophthalmic solution 1 drop every morning.   tiZANidine (ZANAFLEX) 4 MG tablet Take 1 tablet (4 mg total) by mouth 3  (three) times daily.   Wheat Dextrin (BENEFIBER) POWD Take by mouth daily.   No facility-administered encounter medications on file as of 04/11/2022.     Review of Systems  Review of Systems  Constitutional: Negative.   HENT: Negative.    Cardiovascular: Negative.   Gastrointestinal: Negative.   Musculoskeletal:  Positive for back pain.  Allergic/Immunologic: Negative.   Neurological: Negative.   Psychiatric/Behavioral: Negative.         Physical Exam  BP (!) 118/56   Pulse 80   Temp (!) 97.3 F (36.3 C) (Temporal)   Resp 16   Ht 5' (1.524 m)   Wt 196 lb 12.8 oz (89.3 kg)   LMP  (LMP Unknown)   SpO2 100%   BMI 38.43 kg/m   Wt Readings from Last 5 Encounters:  04/11/22 196 lb 12.8 oz (89.3 kg)  01/07/22 192 lb (87.1 kg)  10/21/21 189 lb 4 oz (85.8 kg)  10/18/21 188 lb 14.4 oz (85.7 kg)  04/23/21 198 lb 9.6 oz (90.1 kg)     Physical Exam Vitals and nursing note reviewed.  Constitutional:      General: She is not in acute distress.    Appearance: She is well-developed.  Cardiovascular:     Rate and Rhythm: Normal rate and regular rhythm.  Pulmonary:     Effort: Pulmonary effort is normal.     Breath sounds: Normal breath sounds.  Neurological:     Mental Status: She is alert and oriented to person, place, and time.      Lab Results:  CBC    Component Value Date/Time   WBC 9.1 10/18/2021 0934   WBC 8.1 10/08/2020 0947   RBC 4.20 10/18/2021 0934   RBC 4.18 10/18/2021 0934   HGB 11.3 (L) 10/18/2021 0934   HGB 11.3 01/20/2021 1158   HGB 12.0 01/24/2017 1017   HCT 35.0 (L) 10/18/2021 0934   HCT 35.8 01/20/2021 1158   HCT 37.1 01/24/2017 1017   PLT 283 10/18/2021 0934   PLT 210 01/20/2021 1158   MCV 83.3 10/18/2021 0934   MCV 83 01/20/2021 1158   MCV 83.7 01/24/2017 1017   MCH 26.9 10/18/2021 0934   MCHC 32.3 10/18/2021 0934   RDW 15.9 (H) 10/18/2021 0934   RDW 14.5 01/20/2021 1158   RDW 15.1 (H) 01/24/2017 1017   LYMPHSABS 1.9 10/18/2021  0934   LYMPHSABS 1.6 01/20/2021 1158   LYMPHSABS 1.2 01/24/2017 1017   MONOABS 0.6 10/18/2021 0934   MONOABS 0.5 01/24/2017 1017   EOSABS 0.2 10/18/2021 0934   EOSABS 0.2 01/20/2021 1158   BASOSABS 0.1 10/18/2021 0934   BASOSABS 0.0 01/20/2021 1158   BASOSABS 0.1 01/24/2017 1017    BMET    Component Value Date/Time   NA 140 10/18/2021 0934   NA 142 01/20/2021 1158   NA 141 01/24/2017 1018   K 4.0 10/18/2021 0934   K 4.0 01/24/2017 1018   CL 109 10/18/2021 0934   CO2 25  10/18/2021 0934   CO2 25 01/24/2017 1018   GLUCOSE 117 (H) 10/18/2021 0934   GLUCOSE 94 01/24/2017 1018   BUN 11 10/18/2021 0934   BUN 10 01/20/2021 1158   BUN 9.6 01/24/2017 1018   CREATININE 0.65 10/18/2021 0934   CREATININE 0.7 01/24/2017 1018   CALCIUM 9.5 10/18/2021 0934   CALCIUM 9.7 01/24/2017 1018   GFRNONAA >60 10/18/2021 0934   GFRNONAA >89 07/04/2016 1115   GFRAA 110 04/27/2020 1106   GFRAA >89 07/04/2016 1115      Assessment & Plan:   Essential hypertension - CBC - Comprehensive metabolic panel - Lipid Panel  2. Lipid screening  - Lipid Panel  3. Flu vaccine need  - Flu Vaccine QUAD 41moIM (Fluarix, Fluzone & Alfiuria Quad PF)  Follow up:  Follow up in 6 months or sooner if needed     TFenton Foy NP 04/11/2022

## 2022-04-11 NOTE — Assessment & Plan Note (Signed)
-  CBC - Comprehensive metabolic panel - Lipid Panel  2. Lipid screening  - Lipid Panel  3. Flu vaccine need  - Flu Vaccine QUAD 48moIM (Fluarix, Fluzone & Alfiuria Quad PF)  Follow up:  Follow up in 6 months or sooner if needed

## 2022-04-12 LAB — COMPREHENSIVE METABOLIC PANEL
ALT: 21 IU/L (ref 0–32)
AST: 24 IU/L (ref 0–40)
Albumin/Globulin Ratio: 1.4 (ref 1.2–2.2)
Albumin: 3.9 g/dL (ref 3.8–4.9)
Alkaline Phosphatase: 84 IU/L (ref 44–121)
BUN/Creatinine Ratio: 16 (ref 9–23)
BUN: 9 mg/dL (ref 6–24)
Bilirubin Total: <0.2 mg/dL (ref 0.0–1.2)
CO2: 20 mmol/L (ref 20–29)
Calcium: 9.3 mg/dL (ref 8.7–10.2)
Chloride: 111 mmol/L — ABNORMAL HIGH (ref 96–106)
Creatinine, Ser: 0.56 mg/dL — ABNORMAL LOW (ref 0.57–1.00)
Globulin, Total: 2.7 g/dL (ref 1.5–4.5)
Glucose: 121 mg/dL — ABNORMAL HIGH (ref 70–99)
Potassium: 4.3 mmol/L (ref 3.5–5.2)
Sodium: 144 mmol/L (ref 134–144)
Total Protein: 6.6 g/dL (ref 6.0–8.5)
eGFR: 106 mL/min/{1.73_m2} (ref >59)

## 2022-04-12 LAB — CBC
Hematocrit: 34.1 % (ref 34.0–46.6)
Hemoglobin: 10.6 g/dL — ABNORMAL LOW (ref 11.1–15.9)
MCH: 25.8 pg — ABNORMAL LOW (ref 26.6–33.0)
MCHC: 31.1 g/dL — ABNORMAL LOW (ref 31.5–35.7)
MCV: 83 fL (ref 79–97)
Platelets: 239 10*3/uL (ref 150–450)
RBC: 4.11 x10E6/uL (ref 3.77–5.28)
RDW: 14.4 % (ref 11.7–15.4)
WBC: 7.8 10*3/uL (ref 3.4–10.8)

## 2022-04-12 LAB — LIPID PANEL
Chol/HDL Ratio: 2.5 ratio (ref 0.0–4.4)
Cholesterol, Total: 113 mg/dL (ref 100–199)
HDL: 45 mg/dL (ref >39)
LDL Chol Calc (NIH): 54 mg/dL (ref 0–99)
Triglycerides: 63 mg/dL (ref 0–149)
VLDL Cholesterol Cal: 14 mg/dL (ref 5–40)

## 2022-04-20 ENCOUNTER — Other Ambulatory Visit: Payer: Self-pay

## 2022-04-20 DIAGNOSIS — N6092 Unspecified benign mammary dysplasia of left breast: Secondary | ICD-10-CM

## 2022-04-20 DIAGNOSIS — E538 Deficiency of other specified B group vitamins: Secondary | ICD-10-CM

## 2022-04-20 DIAGNOSIS — D509 Iron deficiency anemia, unspecified: Secondary | ICD-10-CM

## 2022-04-20 NOTE — Assessment & Plan Note (Signed)
08/31/2016: Left lumpectomy: [facility] 11/07/2016: Anastrozole was started but discontinued 02/07/2017 due to poor tolerance 03/21/2017: Started tamoxifen prior hysterectomy  B12 deficiency: On monthly supplementation  Tamoxifen toxicities:  Breast cancer surveillance: Breast exam 04/21/2022: Benign Mammogram 10/06/2021: Benign breast density category B  Return to clinic in 1 year for follow-up

## 2022-04-21 ENCOUNTER — Other Ambulatory Visit: Payer: Self-pay

## 2022-04-21 ENCOUNTER — Inpatient Hospital Stay: Payer: 59 | Attending: Hematology and Oncology

## 2022-04-21 ENCOUNTER — Inpatient Hospital Stay (HOSPITAL_BASED_OUTPATIENT_CLINIC_OR_DEPARTMENT_OTHER): Payer: 59 | Admitting: Hematology and Oncology

## 2022-04-21 ENCOUNTER — Ambulatory Visit: Payer: Medicare Other | Admitting: Nurse Practitioner

## 2022-04-21 VITALS — BP 136/71 | HR 72 | Temp 97.3°F | Resp 17 | Wt 198.0 lb

## 2022-04-21 DIAGNOSIS — Z79810 Long term (current) use of selective estrogen receptor modulators (SERMs): Secondary | ICD-10-CM | POA: Insufficient documentation

## 2022-04-21 DIAGNOSIS — N6092 Unspecified benign mammary dysplasia of left breast: Secondary | ICD-10-CM | POA: Insufficient documentation

## 2022-04-21 DIAGNOSIS — E538 Deficiency of other specified B group vitamins: Secondary | ICD-10-CM

## 2022-04-21 DIAGNOSIS — Z79899 Other long term (current) drug therapy: Secondary | ICD-10-CM | POA: Insufficient documentation

## 2022-04-21 DIAGNOSIS — Z791 Long term (current) use of non-steroidal anti-inflammatories (NSAID): Secondary | ICD-10-CM | POA: Diagnosis not present

## 2022-04-21 DIAGNOSIS — D649 Anemia, unspecified: Secondary | ICD-10-CM | POA: Insufficient documentation

## 2022-04-21 DIAGNOSIS — D509 Iron deficiency anemia, unspecified: Secondary | ICD-10-CM

## 2022-04-21 DIAGNOSIS — R5383 Other fatigue: Secondary | ICD-10-CM | POA: Diagnosis not present

## 2022-04-21 LAB — CMP (CANCER CENTER ONLY)
ALT: 20 U/L (ref 0–44)
AST: 24 U/L (ref 15–41)
Albumin: 3.4 g/dL — ABNORMAL LOW (ref 3.5–5.0)
Alkaline Phosphatase: 73 U/L (ref 38–126)
Anion gap: 5 (ref 5–15)
BUN: 11 mg/dL (ref 6–20)
CO2: 25 mmol/L (ref 22–32)
Calcium: 9.1 mg/dL (ref 8.9–10.3)
Chloride: 110 mmol/L (ref 98–111)
Creatinine: 0.57 mg/dL (ref 0.44–1.00)
GFR, Estimated: >60 mL/min (ref >60)
Glucose, Bld: 99 mg/dL (ref 70–99)
Potassium: 3.8 mmol/L (ref 3.5–5.1)
Sodium: 140 mmol/L (ref 135–145)
Total Bilirubin: 0.3 mg/dL (ref 0.3–1.2)
Total Protein: 6.9 g/dL (ref 6.5–8.1)

## 2022-04-21 LAB — CBC WITH DIFFERENTIAL (CANCER CENTER ONLY)
Abs Immature Granulocytes: 0.03 10*3/uL (ref 0.00–0.07)
Basophils Absolute: 0.1 10*3/uL (ref 0.0–0.1)
Basophils Relative: 1 %
Eosinophils Absolute: 0.2 10*3/uL (ref 0.0–0.5)
Eosinophils Relative: 3 %
HCT: 33.3 % — ABNORMAL LOW (ref 36.0–46.0)
Hemoglobin: 10.6 g/dL — ABNORMAL LOW (ref 12.0–15.0)
Immature Granulocytes: 0 %
Lymphocytes Relative: 20 %
Lymphs Abs: 1.7 10*3/uL (ref 0.7–4.0)
MCH: 26.3 pg (ref 26.0–34.0)
MCHC: 31.8 g/dL (ref 30.0–36.0)
MCV: 82.6 fL (ref 80.0–100.0)
Monocytes Absolute: 0.7 10*3/uL (ref 0.1–1.0)
Monocytes Relative: 8 %
Neutro Abs: 5.8 10*3/uL (ref 1.7–7.7)
Neutrophils Relative %: 68 %
Platelet Count: 256 10*3/uL (ref 150–400)
RBC: 4.03 MIL/uL (ref 3.87–5.11)
RDW: 14.6 % (ref 11.5–15.5)
WBC Count: 8.5 10*3/uL (ref 4.0–10.5)
nRBC: 0 % (ref 0.0–0.2)

## 2022-04-21 LAB — FERRITIN: Ferritin: 9 ng/mL — ABNORMAL LOW (ref 11–307)

## 2022-04-21 LAB — RETIC PANEL
Immature Retic Fract: 29.9 % — ABNORMAL HIGH (ref 2.3–15.9)
RBC.: 4.01 MIL/uL (ref 3.87–5.11)
Retic Count, Absolute: 71 10*3/uL (ref 19.0–186.0)
Retic Ct Pct: 1.8 % (ref 0.4–3.1)
Reticulocyte Hemoglobin: 28.4 pg (ref >27.9)

## 2022-04-21 LAB — IRON AND IRON BINDING CAPACITY (CC-WL,HP ONLY)
Iron: 40 ug/dL (ref 28–170)
Saturation Ratios: 9 % — ABNORMAL LOW (ref 10.4–31.8)
TIBC: 452 ug/dL — ABNORMAL HIGH (ref 250–450)
UIBC: 412 ug/dL (ref 148–442)

## 2022-04-21 NOTE — Progress Notes (Signed)
Patient Care Team: Fenton Foy, NP as PCP - General (Pulmonary Disease) Mauri Pole, MD as Consulting Physician (Gastroenterology)  DIAGNOSIS:  Encounter Diagnoses  Name Primary?   Atypical lobular hyperplasia (ALH) of left breast Yes   B12 deficiency       CHIEF COMPLIANT: Atypical lobular hyperplasia   INTERVAL HISTORY: Alexandra Beasley is a 58 y.o  with history of Atypical lobular hyperplasia. Currently on tamoxifen.  She completed 5 years of therapy and therefore she can discontinue it at this time.  She presents to the clinic for a follow-up. She is tolerating the tamoxifen extremely well. She says she does feels fatigue after stopping B12.  She could not come monthly for the injections because of transportation issues.  She was prescribed B12 but she has not been taking it.  She denies any lumps or nodules in the breast.  ALLERGIES:  is allergic to feraheme [ferumoxytol].  MEDICATIONS:  Current Outpatient Medications  Medication Sig Dispense Refill   adapalene (DIFFERIN) 0.1 % gel Apply topically.     amLODipine (NORVASC) 5 MG tablet TAKE 1 TABLET(5 MG) BY MOUTH DAILY 90 tablet 3   atorvastatin (LIPITOR) 40 MG tablet TAKE 1 TABLET(40 MG) BY MOUTH DAILY AT 6 PM 90 tablet 1   baclofen (LIORESAL) 10 MG tablet Take 1 tablet (10 mg total) by mouth 3 (three) times daily as needed for muscle spasms. 90 tablet 1   Calcium Carbonate-Vitamin D (CALTRATE 600+D PO) Take by mouth.     Cholecalciferol (VITAMIN D3) 1000 units CAPS Take by mouth.     diclofenac (VOLTAREN) 75 MG EC tablet Take 1 tablet (75 mg total) by mouth 2 (two) times daily. 30 tablet 0   diclofenac Sodium (VOLTAREN) 1 % GEL APPLY 4 GRAMS TOPICALLY TO THE AFFECTED AREA FOUR TIMES DAILY 300 g 0   fluticasone (FLONASE) 50 MCG/ACT nasal spray Place 2 sprays into both nostrils daily. 16 g 6   hydroquinone 4 % cream Apply to dark spots only of face once weekly as tolerated.     latanoprost (XALATAN) 0.005  % ophthalmic solution Place 1 drop into both eyes daily.     meloxicam (MOBIC) 15 MG tablet Take 15 mg by mouth daily.     omeprazole (PRILOSEC) 40 MG capsule TAKE 1 CAPSULE(40 MG) BY MOUTH DAILY 90 capsule 1   tamoxifen (NOLVADEX) 20 MG tablet TAKE 1 TABLET(20 MG) BY MOUTH DAILY 90 tablet 3   timolol (BETIMOL) 0.5 % ophthalmic solution 1 drop once.     timolol (TIMOPTIC) 0.5 % ophthalmic solution 1 drop every morning.     tiZANidine (ZANAFLEX) 4 MG tablet Take 1 tablet (4 mg total) by mouth 3 (three) times daily. 90 tablet 1   Wheat Dextrin (BENEFIBER) POWD Take by mouth daily.     No current facility-administered medications for this visit.    PHYSICAL EXAMINATION: ECOG PERFORMANCE STATUS: 1 - Symptomatic but completely ambulatory  Vitals:   04/21/22 0936  BP: 136/71  Pulse: 72  Resp: 17  Temp: (!) 97.3 F (36.3 C)  SpO2: 98%   Filed Weights   04/21/22 0936  Weight: 198 lb (89.8 kg)      LABORATORY DATA:  I have reviewed the data as listed    Latest Ref Rng & Units 04/21/2022    9:22 AM 04/11/2022   10:20 AM 10/18/2021    9:34 AM  CMP  Glucose 70 - 99 mg/dL 99  121  117   BUN  6 - 20 mg/dL '11  9  11   '$ Creatinine 0.44 - 1.00 mg/dL 0.57  0.56  0.65   Sodium 135 - 145 mmol/L 140  144  140   Potassium 3.5 - 5.1 mmol/L 3.8  4.3  4.0   Chloride 98 - 111 mmol/L 110  111  109   CO2 22 - 32 mmol/L '25  20  25   '$ Calcium 8.9 - 10.3 mg/dL 9.1  9.3  9.5   Total Protein 6.5 - 8.1 g/dL 6.9  6.6  7.4   Total Bilirubin 0.3 - 1.2 mg/dL 0.3  <0.2  0.4   Alkaline Phos 38 - 126 U/L 73  84  72   AST 15 - 41 U/L 24  24  37   ALT 0 - 44 U/L '20  21  25     '$ Lab Results  Component Value Date   WBC 8.5 04/21/2022   HGB 10.6 (L) 04/21/2022   HCT 33.3 (L) 04/21/2022   MCV 82.6 04/21/2022   PLT 256 04/21/2022   NEUTROABS 5.8 04/21/2022    ASSESSMENT & PLAN:  Atypical lobular hyperplasia [facility]) of left breast 08/31/2016: Left lumpectomy: [facility] 11/07/2016: Anastrozole was started but  discontinued 02/07/2017 due to poor tolerance 03/21/2017-04/21/2022: Started tamoxifen prior hysterectomy  B12 deficiency: Was previously on monthly supplementation, discontinued due to transportation issues.  Encouraged her to take 5000 mcg of sublingual vitamin B12 daily.  Tamoxifen toxicities: Tolerating it extremely well. She completed 5 years of therapy and therefore she can discontinue it.  Breast cancer surveillance: Breast exam 04/21/2022: Benign Mammogram 10/06/2021: Benign breast density category B  Anemia: Hemoglobin 10.6: Iron studies are pending.  Previously she had reaction to IV iron. We will call her with the results of iron studies.  Return to clinic on an as-needed basis    Orders Placed This Encounter  Procedures   Vitamin B12    Standing Status:   Future    Standing Expiration Date:   04/21/2023   The patient has a good understanding of the overall plan. she agrees with it. she will call with any problems that may develop before the next visit here. Total time spent: 30 mins including face to face time and time spent for planning, charting and co-ordination of care   Harriette Ohara, MD 04/21/22    I Gardiner Coins am acting as a Education administrator for [employer]  I have reviewed the above documentation for accuracy and completeness, and I agree with the above.

## 2022-07-13 ENCOUNTER — Encounter: Payer: Self-pay | Admitting: Nurse Practitioner

## 2022-07-13 ENCOUNTER — Ambulatory Visit (INDEPENDENT_AMBULATORY_CARE_PROVIDER_SITE_OTHER): Payer: 59 | Admitting: Nurse Practitioner

## 2022-07-13 VITALS — BP 110/47 | HR 69 | Temp 97.2°F | Ht 62.0 in | Wt 197.6 lb

## 2022-07-13 DIAGNOSIS — I10 Essential (primary) hypertension: Secondary | ICD-10-CM | POA: Diagnosis not present

## 2022-07-13 DIAGNOSIS — L819 Disorder of pigmentation, unspecified: Secondary | ICD-10-CM

## 2022-07-13 MED ORDER — AMLODIPINE BESYLATE 5 MG PO TABS: 5.0000 mg | ORAL_TABLET | Freq: Every day | ORAL | 3 refills | Status: DC

## 2022-07-13 NOTE — Progress Notes (Signed)
  ID: Alexandra Beasley, female    DOB: 27-May-1965, 57 y.o.   MRN: [id number]  Chief Complaint  Patient presents with   Hypertension    Follow up    Referring provider: Ivonne Andrew, NP   HPI  Alexandra Beasley presents for follow up. She  has a past medical history of Abnormal mammogram of left breast (08/31/2016), Arthritis, Atypical ductal hyperplasia of left breast, Bilateral lower extremity edema (04/2019), Bilateral lower extremity edema (04/2019), Breast mass, left, GERD (gastroesophageal reflux disease), Glaucoma, Hyperlipidemia, Hypertension, Other skin changes (04/2019), SVD (spontaneous vaginal delivery), Vitamin B12 deficiency, and Vitamin D deficiency (10/2019).    Patient presents today for routine follow-up on hypertension.  Patient is compliant with medication.  Vital signs are stable in office today.  Patient is  following with oncology for past history of breast cancer.  Patient does complain today about discoloration to the skin on her face.  She has tried different creams with no relief noted.  We will refer her to dermatology for further evaluation.  Denies f/c/s, n/v/d, hemoptysis, PND, leg swelling Denies chest pain or edema        Allergies  Allergen Reactions   Feraheme [Ferumoxytol] Anaphylaxis    Pt had allergic reaction to [facility] 05/01/2021. See progress notes from 1210. Pt unable to complete infusion.     Immunization History  Administered Date(s) Administered   Influenza,inj,Quad PF,6+ Mos 06/04/2015, 12/28/2015, 01/01/2018, 12/20/2018, 12/30/2019, 01/20/2021, 04/11/2022   PFIZER(Purple Top)SARS-COV-2 Vaccination 05/29/2019, 06/28/2019, 01/31/2020   Tdap 06/04/2015    Past Medical History:  Diagnosis Date   Abnormal mammogram of left breast 08/31/2016   Arthritis    knee   Atypical ductal hyperplasia of left breast    Bilateral lower extremity edema 04/2019   Bilateral lower extremity edema 04/2019   Breast mass,  left    GERD (gastroesophageal reflux disease)    Glaucoma    Hyperlipidemia    Hypertension    Other skin changes 04/2019   SVD (spontaneous vaginal delivery)    x 2   Vitamin B12 deficiency    Vitamin D deficiency 10/2019    Tobacco History: Social History   Tobacco Use  Smoking Status Never  Smokeless Tobacco Never   Counseling given: Not Answered   Outpatient Encounter Medications as of 07/13/2022  Medication Sig   adapalene (DIFFERIN) 0.1 % gel Apply topically.   atorvastatin (LIPITOR) 40 MG tablet TAKE 1 TABLET(40 MG) BY MOUTH DAILY AT 6 PM   baclofen (LIORESAL) 10 MG tablet Take 1 tablet (10 mg total) by mouth 3 (three) times daily as needed for muscle spasms.   Calcium Carbonate-Vitamin D (CALTRATE 600+D PO) Take by mouth.   Cholecalciferol (VITAMIN D3) 1000 units CAPS Take by mouth.   diclofenac (VOLTAREN) 75 MG EC tablet Take 1 tablet (75 mg total) by mouth 2 (two) times daily.   diclofenac Sodium (VOLTAREN) 1 % GEL APPLY 4 GRAMS TOPICALLY TO THE AFFECTED AREA FOUR TIMES DAILY   hydroquinone 4 % cream Apply to dark spots only of face once weekly as tolerated.   latanoprost (XALATAN) 0.005 % ophthalmic solution Place 1 drop into both eyes daily.   meloxicam (MOBIC) 15 MG tablet Take 15 mg by mouth daily.   omeprazole (PRILOSEC) 40 MG capsule TAKE 1 CAPSULE(40 MG) BY MOUTH DAILY   timolol (BETIMOL) 0.5 % ophthalmic solution 1 drop once.   tiZANidine (ZANAFLEX) 4 MG tablet Take 1 tablet (4 mg total) by mouth 3 (three)  times daily.   Wheat Dextrin (BENEFIBER) POWD Take by mouth daily.   [DISCONTINUED] amLODipine (NORVASC) 5 MG tablet TAKE 1 TABLET(5 MG) BY MOUTH DAILY   amLODipine (NORVASC) 5 MG tablet Take 1 tablet (5 mg total) by mouth daily.   fluticasone (FLONASE) 50 MCG/ACT nasal spray Place 2 sprays into both nostrils daily. (Patient not taking: Reported on 07/13/2022)   tamoxifen (NOLVADEX) 20 MG tablet TAKE 1 TABLET(20 MG) BY MOUTH DAILY (Patient not taking:  Reported on 07/13/2022)   timolol (TIMOPTIC) 0.5 % ophthalmic solution 1 drop every morning.   No facility-administered encounter medications on file as of 07/13/2022.     Review of Systems  Review of Systems     Physical Exam  BP (!) 110/47   Pulse 69   Temp (!) 97.2 F (36.2 C)   Ht  (1.575 m)   Wt 197 lb 9.6 oz (89.6 kg)   LMP  (LMP Unknown)   SpO2 99%   BMI 36.14 kg/m   Wt Readings from Last 5 Encounters:  07/13/22 197 lb 9.6 oz (89.6 kg)  04/21/22 198 lb (89.8 kg)  04/11/22 196 lb 12.8 oz (89.3 kg)  01/07/22 192 lb (87.1 kg)  10/21/21 189 lb 4 oz (85.8 kg)     Physical Exam   Lab Results:  CBC    Component Value Date/Time   WBC 8.5 04/21/2022 0922   WBC 8.1 10/08/2020 0947   RBC 4.01 04/21/2022 0922   RBC 4.03 04/21/2022 0922   HGB 10.6 (L) 04/21/2022 0922   HGB 10.6 (L) 04/11/2022 1020   HGB 12.0 01/24/2017 1017   HCT 33.3 (L) 04/21/2022 0922   HCT 34.1 04/11/2022 1020   HCT 37.1 01/24/2017 1017   PLT 256 04/21/2022 0922   PLT 239 04/11/2022 1020   MCV 82.6 04/21/2022 0922   MCV 83 04/11/2022 1020   MCV 83.7 01/24/2017 1017   MCH 26.3 04/21/2022 0922   MCHC 31.8 04/21/2022 0922   RDW 14.6 04/21/2022 0922   RDW 14.4 04/11/2022 1020   RDW 15.1 (H) 01/24/2017 1017   LYMPHSABS 1.7 04/21/2022 0922   LYMPHSABS 1.6 01/20/2021 1158   LYMPHSABS 1.2 01/24/2017 1017   MONOABS 0.7 04/21/2022 0922   MONOABS 0.5 01/24/2017 1017   EOSABS 0.2 04/21/2022 0922   EOSABS 0.2 01/20/2021 1158   BASOSABS 0.1 04/21/2022 0922   BASOSABS 0.0 01/20/2021 1158   BASOSABS 0.1 01/24/2017 1017    BMET    Component Value Date/Time   NA 140 04/21/2022 0922   NA 144 04/11/2022 1020   NA 141 01/24/2017 1018   K 3.8 04/21/2022 0922   K 4.0 01/24/2017 1018   CL 110 04/21/2022 0922   CO2 25 04/21/2022 0922   CO2 25 01/24/2017 1018   GLUCOSE 99 04/21/2022 0922   GLUCOSE 94 01/24/2017 1018   BUN 11 04/21/2022 0922   BUN 9 04/11/2022 1020   BUN 9.6 01/24/2017  1018   CREATININE 0.57 04/21/2022 0922   CREATININE 0.7 01/24/2017 1018   CALCIUM 9.1 04/21/2022 0922   CALCIUM 9.7 01/24/2017 1018   GFRNONAA >60 04/21/2022 0922   GFRNONAA >89 07/04/2016 1115   GFRAA 110 04/27/2020 1106   GFRAA >89 07/04/2016 1115     Assessment & Plan:   Discoloration of skin of face - Ambulatory referral to Dermatology   2. Essential hypertension  - amLODipine (NORVASC) 5 MG tablet; Take 1 tablet (5 mg total) by mouth daily.  Dispense: 90 tablet; Refill:  3   Follow up:  Follow up in 6 months     Ivonne Andrew, NP 07/13/2022

## 2022-07-13 NOTE — Assessment & Plan Note (Signed)
-   Ambulatory referral to Dermatology   2. Essential hypertension  - amLODipine (NORVASC) 5 MG tablet; Take 1 tablet (5 mg total) by mouth daily.  Dispense: 90 tablet; Refill: 3   Follow up:  Follow up in 6 months

## 2022-07-13 NOTE — Patient Instructions (Addendum)
1. Discoloration of skin of face  - Ambulatory referral to Dermatology   2. Essential hypertension  - amLODipine (NORVASC) 5 MG tablet; Take 1 tablet (5 mg total) by mouth daily.  Dispense: 90 tablet; Refill: 3   Follow up:  Follow up in 6 months

## 2022-09-12 ENCOUNTER — Other Ambulatory Visit: Payer: Self-pay | Admitting: Adult Health

## 2022-09-12 DIAGNOSIS — Z1231 Encounter for screening mammogram for malignant neoplasm of breast: Secondary | ICD-10-CM

## 2022-09-16 ENCOUNTER — Other Ambulatory Visit: Payer: Self-pay | Admitting: Nurse Practitioner

## 2022-09-16 DIAGNOSIS — E785 Hyperlipidemia, unspecified: Secondary | ICD-10-CM

## 2022-10-12 ENCOUNTER — Ambulatory Visit
Admission: RE | Admit: 2022-10-12 | Discharge: 2022-10-12 | Disposition: A | Discharge status: Home / Self Care | Payer: 59 | Source: Ambulatory Visit | Attending: Adult Health | Admitting: Adult Health

## 2022-10-12 DIAGNOSIS — Z1231 Encounter for screening mammogram for malignant neoplasm of breast: Secondary | ICD-10-CM

## 2022-12-20 ENCOUNTER — Other Ambulatory Visit: Payer: Self-pay

## 2022-12-20 DIAGNOSIS — M2550 Pain in unspecified joint: Secondary | ICD-10-CM

## 2022-12-20 NOTE — Telephone Encounter (Signed)
Please advise Kh 

## 2022-12-21 MED ORDER — DICLOFENAC SODIUM 1 % EX GEL: 4.0000 g | Freq: Four times a day (QID) | CUTANEOUS | 0 refills | Status: DC

## 2023-01-12 ENCOUNTER — Ambulatory Visit (INDEPENDENT_AMBULATORY_CARE_PROVIDER_SITE_OTHER): Payer: 59 | Admitting: Nurse Practitioner

## 2023-01-12 ENCOUNTER — Encounter: Payer: Self-pay | Admitting: Nurse Practitioner

## 2023-01-12 ENCOUNTER — Ambulatory Visit (HOSPITAL_COMMUNITY)
Admission: RE | Admit: 2023-01-12 | Discharge: 2023-01-12 | Disposition: A | Discharge status: Home / Self Care | Payer: 59 | Source: Ambulatory Visit | Attending: Nurse Practitioner | Admitting: Nurse Practitioner

## 2023-01-12 VITALS — BP 119/49 | HR 67 | Temp 96.4°F | Wt 191.0 lb

## 2023-01-12 DIAGNOSIS — M25562 Pain in left knee: Secondary | ICD-10-CM | POA: Diagnosis present

## 2023-01-12 DIAGNOSIS — L819 Disorder of pigmentation, unspecified: Secondary | ICD-10-CM | POA: Diagnosis not present

## 2023-01-12 DIAGNOSIS — Z23 Encounter for immunization: Secondary | ICD-10-CM

## 2023-01-12 DIAGNOSIS — G8929 Other chronic pain: Secondary | ICD-10-CM | POA: Diagnosis present

## 2023-01-12 DIAGNOSIS — R0602 Shortness of breath: Secondary | ICD-10-CM

## 2023-01-12 DIAGNOSIS — M2550 Pain in unspecified joint: Secondary | ICD-10-CM

## 2023-01-12 DIAGNOSIS — M25561 Pain in right knee: Secondary | ICD-10-CM | POA: Insufficient documentation

## 2023-01-12 MED ORDER — PREDNISONE 20 MG PO TABS
20.0000 mg | ORAL_TABLET | Freq: Every day | ORAL | 0 refills | Status: AC
Start: 2023-01-12 — End: 2023-01-17

## 2023-01-12 MED ORDER — DICLOFENAC SODIUM 1 % EX GEL
4.0000 g | Freq: Four times a day (QID) | CUTANEOUS | 0 refills | Status: DC
Start: 2023-01-12 — End: 2023-07-20

## 2023-01-12 NOTE — Progress Notes (Signed)
Subjective   Patient ID: Alexandra Beasley, female    DOB: Aug 18, 1965, 57 y.o.   MRN: [id number]  Chief Complaint  Patient presents with   Hypertension    Referring provider: Ivonne Andrew, NP  Alexandra Beasley is a 57 y.o. female with Past Medical History: 08/31/2016: Abnormal mammogram of left breast No date: Arthritis     Comment:  knee No date: Atypical ductal hyperplasia of left breast 04/2019: Bilateral lower extremity edema 04/2019: Bilateral lower extremity edema No date: Breast mass, left No date: GERD (gastroesophageal reflux disease) No date: Glaucoma No date: Hyperlipidemia No date: Hypertension 04/2019: Other skin changes No date: SVD (spontaneous vaginal delivery)     Comment:  x 2 No date: Vitamin B12 deficiency 10/2019: Vitamin D deficiency   HPI   Ms. Ueno is in today for follow up for Hypertension. The current prescribed treatment is Amlodipine 5 mg. Patient is also on atorvastatin. Denies headache, dizziness, visual changes, chest pain, nausea, vomiting or any edema.   Patient does complain today of increased shortness of breath going up stairs.  Also complains of bilateral knee pain.  This is a chronic condition.  We will order x-rays today.  Patient may need referral to orthopedics.   Allergies  Allergen Reactions   Feraheme [Ferumoxytol] Anaphylaxis    Pt had allergic reaction to [facility] 05/01/2021. See progress notes from 1210. Pt unable to complete infusion.     Immunization History  Administered Date(s) Administered   Influenza,inj,Quad PF,6+ Mos 06/04/2015, 12/28/2015, 01/01/2018, 12/20/2018, 12/30/2019, 01/20/2021, 04/11/2022   PFIZER(Purple Top)SARS-COV-2 Vaccination 05/29/2019, 06/28/2019, 01/31/2020   Tdap 06/04/2015    Tobacco History: Social History   Tobacco Use  Smoking Status Never  Smokeless Tobacco Never   Counseling given: Not Answered   Outpatient Encounter Medications as of 01/12/2023   Medication Sig   adapalene (DIFFERIN) 0.1 % gel Apply topically.   amLODipine (NORVASC) 5 MG tablet Take 1 tablet (5 mg total) by mouth daily.   atorvastatin (LIPITOR) 40 MG tablet TAKE 1 TABLET(40 MG) BY MOUTH DAILY AT 6 PM   Calcium Carbonate-Vitamin D (CALTRATE 600+D PO) Take by mouth.   Cholecalciferol (VITAMIN D3) 1000 units CAPS Take by mouth.   diclofenac (VOLTAREN) 75 MG EC tablet Take 1 tablet (75 mg total) by mouth 2 (two) times daily.   latanoprost (XALATAN) 0.005 % ophthalmic solution Place 1 drop into both eyes daily.   meloxicam (MOBIC) 15 MG tablet Take 15 mg by mouth daily.   omeprazole (PRILOSEC) 40 MG capsule TAKE 1 CAPSULE(40 MG) BY MOUTH DAILY   predniSONE (DELTASONE) 20 MG tablet Take 1 tablet (20 mg total) by mouth daily with breakfast for 5 days.   timolol (BETIMOL) 0.5 % ophthalmic solution 1 drop once.   timolol (TIMOPTIC) 0.5 % ophthalmic solution 1 drop every morning.   Wheat Dextrin (BENEFIBER) POWD Take by mouth daily.   [DISCONTINUED] diclofenac Sodium (VOLTAREN) 1 % GEL Apply 4 g topically 4 (four) times daily.   diclofenac Sodium (VOLTAREN) 1 % GEL Apply 4 g topically 4 (four) times daily.   fluticasone (FLONASE) 50 MCG/ACT nasal spray Place 2 sprays into both nostrils daily. (Patient not taking: Reported on 07/13/2022)   hydroquinone 4 % cream Apply to dark spots only of face once weekly as tolerated. (Patient not taking: Reported on 01/12/2023)   tamoxifen (NOLVADEX) 20 MG tablet TAKE 1 TABLET(20 MG) BY MOUTH DAILY (Patient not taking: Reported on 07/13/2022)   No facility-administered encounter medications on  file as of 01/12/2023.    Review of Systems  Review of Systems  Constitutional: Negative.   HENT: Negative.    Cardiovascular: Negative.   Gastrointestinal: Negative.   Musculoskeletal:  Positive for arthralgias and myalgias. Negative for joint swelling.  Allergic/Immunologic: Negative.   Neurological: Negative.   Psychiatric/Behavioral:  Negative.       Objective:   BP (!) 119/49   Pulse 67   Temp (!) 96.4 F (35.8 C)   Wt 191 lb (86.6 kg)   LMP  (LMP Unknown)   SpO2 99%   BMI 34.93 kg/m   Wt Readings from Last 5 Encounters:  01/12/23 191 lb (86.6 kg)  07/13/22 197 lb 9.6 oz (89.6 kg)  04/21/22 198 lb (89.8 kg)  04/11/22 196 lb 12.8 oz (89.3 kg)  01/07/22 192 lb (87.1 kg)     Physical Exam Vitals and nursing note reviewed.  Constitutional:      General: She is not in acute distress.    Appearance: She is well-developed.  Cardiovascular:     Rate and Rhythm: Normal rate and regular rhythm.  Pulmonary:     Effort: Pulmonary effort is normal.     Breath sounds: Normal breath sounds.  Musculoskeletal:     Right knee: No swelling. Decreased range of motion. Tenderness present.     Left knee: No swelling. Decreased range of motion. Tenderness present.  Skin:    Comments: Discoloration to skin on face  Neurological:     Mental Status: She is alert and oriented to person, place, and time.       Assessment & Plan:   Need for influenza vaccination -     Flu vaccine trivalent PF, 6mos and older(Flulaval,Afluria,Fluarix,Fluzone)  Discoloration of skin -     Ambulatory referral to Dermatology  Chronic pain of both knees -     DG Knee 1-2 Views Right -     DG Knee 1-2 Views Left -     predniSONE; Take 1 tablet (20 mg total) by mouth daily with breakfast for 5 days.  Dispense: 5 tablet; Refill: 0  Shortness of breath -     CBC -     Comprehensive metabolic panel -     Brain natriuretic peptide  Arthralgia, unspecified joint -     Diclofenac Sodium; Apply 4 g topically 4 (four) times daily.  Dispense: 300 g; Refill: 0     Return in about 6 months (around 07/13/2023).   Ivonne Andrew, NP 01/12/2023

## 2023-01-12 NOTE — Patient Instructions (Addendum)
1. Need for influenza vaccination  - Flu vaccine trivalent PF, 6mos and older(Flulaval,Afluria,Fluarix,Fluzone)  2. Discoloration of skin  - Ambulatory referral to Dermatology  3. Chronic pain of both knees  - DG Knee 1-2 Views Right - DG Knee 1-2 Views Left - predniSONE (DELTASONE) 20 MG tablet; Take 1 tablet (20 mg total) by mouth daily with breakfast for 5 days.  Dispense: 5 tablet; Refill: 0  4. Shortness of breath  - CBC - Comprehensive metabolic panel - Brain natriuretic peptide  Follow up:  Follow up in 6 months

## 2023-01-13 LAB — COMPREHENSIVE METABOLIC PANEL
ALT: 19 [IU]/L (ref 0–32)
AST: 28 [IU]/L (ref 0–40)
Albumin: 3.9 g/dL (ref 3.8–4.9)
Alkaline Phosphatase: 104 [IU]/L (ref 44–121)
BUN/Creatinine Ratio: 15 (ref 9–23)
BUN: 10 mg/dL (ref 6–24)
Bilirubin Total: 0.3 mg/dL (ref 0.0–1.2)
CO2: 23 mmol/L (ref 20–29)
Calcium: 9.5 mg/dL (ref 8.7–10.2)
Chloride: 109 mmol/L — ABNORMAL HIGH (ref 96–106)
Creatinine, Ser: 0.66 mg/dL (ref 0.57–1.00)
Globulin, Total: 2.7 g/dL (ref 1.5–4.5)
Glucose: 114 mg/dL — ABNORMAL HIGH (ref 70–99)
Potassium: 4.1 mmol/L (ref 3.5–5.2)
Sodium: 143 mmol/L (ref 134–144)
Total Protein: 6.6 g/dL (ref 6.0–8.5)
eGFR: 101 mL/min/{1.73_m2} (ref >59)

## 2023-01-13 LAB — CBC
Hematocrit: 32.7 % — ABNORMAL LOW (ref 34.0–46.6)
Hemoglobin: 9.6 g/dL — ABNORMAL LOW (ref 11.1–15.9)
MCH: 23.2 pg — ABNORMAL LOW (ref 26.6–33.0)
MCHC: 29.4 g/dL — ABNORMAL LOW (ref 31.5–35.7)
MCV: 79 fL (ref 79–97)
Platelets: 270 10*3/uL (ref 150–450)
RBC: 4.13 x10E6/uL (ref 3.77–5.28)
RDW: 15.8 % — ABNORMAL HIGH (ref 11.7–15.4)
WBC: 7.4 10*3/uL (ref 3.4–10.8)

## 2023-01-13 LAB — BRAIN NATRIURETIC PEPTIDE: BNP: 52.2 pg/mL (ref 0.0–100.0)

## 2023-01-16 ENCOUNTER — Other Ambulatory Visit: Payer: Self-pay | Admitting: Nurse Practitioner

## 2023-01-25 ENCOUNTER — Encounter: Payer: Self-pay | Admitting: Nurse Practitioner

## 2023-02-28 ENCOUNTER — Ambulatory Visit: Payer: 59 | Admitting: Dermatology

## 2023-05-04 ENCOUNTER — Other Ambulatory Visit: Payer: Self-pay | Admitting: Nurse Practitioner

## 2023-05-04 DIAGNOSIS — K219 Gastro-esophageal reflux disease without esophagitis: Secondary | ICD-10-CM

## 2023-05-09 ENCOUNTER — Telehealth: Payer: Self-pay | Admitting: Hematology and Oncology

## 2023-05-09 NOTE — Telephone Encounter (Signed)
Scheduled appointments per patients request vic incoming call. Talked with the patients daughter and she is aware of the made appointment for the patient.

## 2023-05-22 ENCOUNTER — Inpatient Hospital Stay

## 2023-05-22 ENCOUNTER — Telehealth: Payer: Self-pay

## 2023-05-22 ENCOUNTER — Inpatient Hospital Stay: Payer: 59 | Attending: Hematology and Oncology | Admitting: Hematology and Oncology

## 2023-05-22 ENCOUNTER — Other Ambulatory Visit: Payer: Self-pay | Admitting: Hematology and Oncology

## 2023-05-22 ENCOUNTER — Telehealth: Payer: Self-pay | Admitting: Hematology and Oncology

## 2023-05-22 DIAGNOSIS — Z791 Long term (current) use of non-steroidal anti-inflammatories (NSAID): Secondary | ICD-10-CM | POA: Diagnosis not present

## 2023-05-22 DIAGNOSIS — D509 Iron deficiency anemia, unspecified: Secondary | ICD-10-CM | POA: Diagnosis not present

## 2023-05-22 DIAGNOSIS — Z79899 Other long term (current) drug therapy: Secondary | ICD-10-CM | POA: Diagnosis not present

## 2023-05-22 DIAGNOSIS — N6092 Unspecified benign mammary dysplasia of left breast: Secondary | ICD-10-CM | POA: Insufficient documentation

## 2023-05-22 LAB — CBC WITH DIFFERENTIAL (CANCER CENTER ONLY)
Abs Immature Granulocytes: 0.02 10*3/uL (ref 0.00–0.07)
Basophils Absolute: 0.1 10*3/uL (ref 0.0–0.1)
Basophils Relative: 1 %
Eosinophils Absolute: 0.2 10*3/uL (ref 0.0–0.5)
Eosinophils Relative: 2 %
HCT: 33.7 % — ABNORMAL LOW (ref 36.0–46.0)
Hemoglobin: 10.1 g/dL — ABNORMAL LOW (ref 12.0–15.0)
Immature Granulocytes: 0 %
Lymphocytes Relative: 14 %
Lymphs Abs: 1.1 10*3/uL (ref 0.7–4.0)
MCH: 23.1 pg — ABNORMAL LOW (ref 26.0–34.0)
MCHC: 30 g/dL (ref 30.0–36.0)
MCV: 77.1 fL — ABNORMAL LOW (ref 80.0–100.0)
Monocytes Absolute: 0.5 10*3/uL (ref 0.1–1.0)
Monocytes Relative: 6 %
Neutro Abs: 6 10*3/uL (ref 1.7–7.7)
Neutrophils Relative %: 77 %
Platelet Count: 307 10*3/uL (ref 150–400)
RBC: 4.37 MIL/uL (ref 3.87–5.11)
RDW: 16.7 % — ABNORMAL HIGH (ref 11.5–15.5)
WBC Count: 7.9 10*3/uL (ref 4.0–10.5)
nRBC: 0 % (ref 0.0–0.2)

## 2023-05-22 LAB — IRON AND IRON BINDING CAPACITY (CC-WL,HP ONLY)
Iron: 25 ug/dL — ABNORMAL LOW (ref 28–170)
Saturation Ratios: 5 % — ABNORMAL LOW (ref 10.4–31.8)
TIBC: 494 ug/dL — ABNORMAL HIGH (ref 250–450)
UIBC: 469 ug/dL — ABNORMAL HIGH (ref 148–442)

## 2023-05-22 LAB — VITAMIN B12: Vitamin B-12: 5327 pg/mL — ABNORMAL HIGH (ref 180–914)

## 2023-05-22 LAB — FERRITIN: Ferritin: 7 ng/mL — ABNORMAL LOW (ref 11–307)

## 2023-05-22 MED ORDER — FERROUS GLUCONATE 324 (38 FE) MG PO TABS: 324.0000 mg | ORAL_TABLET | Freq: Two times a day (BID) | ORAL | 3 refills | Status: DC

## 2023-05-22 NOTE — Assessment & Plan Note (Signed)
 08/31/2016: Left lumpectomy: [facility] 11/07/2016: Anastrozole was started but discontinued 02/07/2017 due to poor tolerance 03/21/2017-04/21/2022: Started tamoxifen prior hysterectomy   B12 deficiency: Was previously on monthly supplementation, discontinued due to transportation issues.  Encouraged her to take 5000 mcg of sublingual vitamin B12 daily.   Tamoxifen toxicities: Tolerating it extremely well. She completed 5 years of therapy and therefore she can discontinue it.   Breast cancer surveillance: Breast exam 05/22/2023: Benign Mammogram 10/13/2023: Benign breast density category B

## 2023-05-22 NOTE — Telephone Encounter (Signed)
 Email sent to Lucia Gaskins at [facility] to facilitate and expedite pt having MM and  [place] per MD request.

## 2023-05-22 NOTE — Assessment & Plan Note (Signed)
 Previous reaction to IV iron (05/01/2021 to Feraheme) Lab results: 04/21/2022: Hemoglobin 10.6, MCV 82.6, reticulocyte: 1.8%, iron saturation 9%, TIBC 452, ferritin 9

## 2023-05-22 NOTE — Telephone Encounter (Signed)
 I informed the patient's daughter and her son about her blood work which showed iron deficiency anemia. Because she was previously allergic to IV iron we are recommending oral iron therapy.  I sent a new prescription for oral iron. I recommended that she take it twice a day for the next 3 months and we will recheck her labs in 3 months and telephone visit after that. Family informs me that there is no evidence of blood in the stool.

## 2023-05-22 NOTE — Progress Notes (Signed)
 Patient Care Team: Ivonne Andrew, NP as PCP - General (Pulmonary Disease) Napoleon Form, MD as Consulting Physician (Gastroenterology)  DIAGNOSIS:  Encounter Diagnoses  Name Primary?   Atypical lobular hyperplasia (ALH) of left breast Yes   Iron deficiency anemia, unspecified iron deficiency anemia type       CHIEF COMPLIANT: Left breast pain  HISTORY OF PRESENT ILLNESS: Alexandra Beasley is a 58 year old female who presents with breast pain. She is accompanied by a family member.  She has been experiencing intermittent breast pain for the past 15 to 20 days, primarily noticeable in the morning upon waking. She denies any recent heavy lifting and has not felt any lumps. She speculates that sleeping on one side might be contributing to the pain. No swelling associated with the pain.  Her last mammogram was in July of the previous year and was reported as clear. She has a history of breast biopsies with clips present from previous procedures.  There is no recent blood work since October of the previous year, and she has not had any recent evaluations for anemia.   ALLERGIES:  is allergic to feraheme [ferumoxytol].  MEDICATIONS:  Current Outpatient Medications  Medication Sig Dispense Refill   adapalene (DIFFERIN) 0.1 % gel Apply topically.     amLODipine (NORVASC) 5 MG tablet Take 1 tablet (5 mg total) by mouth daily. 90 tablet 3   atorvastatin (LIPITOR) 40 MG tablet TAKE 1 TABLET(40 MG) BY MOUTH DAILY AT 6 PM 90 tablet 1   Calcium Carbonate-Vitamin D (CALTRATE 600+D PO) Take by mouth.     Cholecalciferol (VITAMIN D3) 1000 units CAPS Take by mouth.     diclofenac (VOLTAREN) 75 MG EC tablet Take 1 tablet (75 mg total) by mouth 2 (two) times daily. (Patient not taking: Reported on 05/22/2023) 30 tablet 0   diclofenac Sodium (VOLTAREN) 1 % GEL Apply 4 g topically 4 (four) times daily. (Patient not taking: Reported on 05/22/2023) 300 g 0   hydroquinone 4 % cream Apply to  dark spots only of face once weekly as tolerated. (Patient not taking: Reported on 05/22/2023)     latanoprost (XALATAN) 0.005 % ophthalmic solution Place 1 drop into both eyes daily.     meloxicam (MOBIC) 15 MG tablet Take 15 mg by mouth daily.     timolol (BETIMOL) 0.5 % ophthalmic solution 1 drop once.     timolol (TIMOPTIC) 0.5 % ophthalmic solution 1 drop every morning.     Wheat Dextrin (BENEFIBER) POWD Take by mouth daily.     No current facility-administered medications for this visit.    PHYSICAL EXAMINATION: ECOG PERFORMANCE STATUS: 1 - Symptomatic but completely ambulatory  There were no vitals filed for this visit. There were no vitals filed for this visit.  Physical Exam   (exam performed in the presence of a chaperone)  LABORATORY DATA:  I have reviewed the data as listed    Latest Ref Rng & Units 01/12/2023    9:17 AM 04/21/2022    9:22 AM 04/11/2022   10:20 AM  CMP  Glucose 70 - 99 mg/dL 409  99  811   BUN 6 - 24 mg/dL 10  11  9    Creatinine 0.57 - 1.00 mg/dL 9.14  7.82  9.56   Sodium 134 - 144 mmol/L 143  140  144   Potassium 3.5 - 5.2 mmol/L 4.1  3.8  4.3   Chloride 96 - 106 mmol/L 109  110  111   CO2 20 - 29 mmol/L 23  25  20    Calcium 8.7 - 10.2 mg/dL 9.5  9.1  9.3   Total Protein 6.0 - 8.5 g/dL 6.6  6.9  6.6   Total Bilirubin 0.0 - 1.2 mg/dL 0.3  0.3  <9.6   Alkaline Phos 44 - 121 IU/L 104  73  84   AST 0 - 40 IU/L 28  24  24    ALT 0 - 32 IU/L 19  20  21      Lab Results  Component Value Date   WBC 7.4 01/12/2023   HGB 9.6 (L) 01/12/2023   HCT 32.7 (L) 01/12/2023   MCV 79 01/12/2023   PLT 270 01/12/2023   NEUTROABS 5.8 04/21/2022    ASSESSMENT & PLAN:  Atypical lobular hyperplasia [facility]) of left breast 08/31/2016: Left lumpectomy: [facility] 11/07/2016: Anastrozole was started but discontinued 02/07/2017 due to poor tolerance 03/21/2017-04/21/2022: Started tamoxifen prior hysterectomy   B12 deficiency: Was previously on monthly supplementation, discontinued  due to transportation issues.  Encouraged her to take 5000 mcg of sublingual vitamin B12 daily.   Tamoxifen toxicities: Tolerating it extremely well. She completed 5 years of therapy and therefore she can discontinue it.   Breast cancer surveillance: Breast exam 05/22/2023: Benign Mammogram 10/13/2023: Benign breast density category B    Iron deficiency anemia Previous reaction to IV iron (05/01/2021 to Feraheme) Lab results: 04/21/2022: Hemoglobin 10.6, MCV 82.6, reticulocyte: 1.8%, iron saturation 9%, TIBC 452, ferritin 9  Breast Pain Intermittent breast pain for 15-20 days, primarily in the morning. No recent heavy lifting or palpable lumps. Last mammogram in July 2024 was clear. Physical exam today was normal, with no tenderness or palpable abnormalities. Differential diagnosis includes muscle pain behind the breast, possibly related to menopause and muscle tightness. Discussed the importance of ruling out any underlying pathology with imaging. Explained that if imaging is clear, daily stretching exercises, including yoga, may help alleviate muscle tightness. - Order mammogram and breast ultrasound - Recommend daily stretching exercises, including yoga  Anemia Anemia with last blood work in October 2024. Discussed the need for updated blood work to assess current status and check B12 levels. - Order blood work including B12 levels - Call patient with results  General Health Maintenance Due for routine mammogram and ultrasound in July 2025. - Schedule mammogram and ultrasound for July 2025  Follow-up - Call patient with lab results by end of the day - Schedule follow-up appointment after imaging results are available.   Orders Placed This Encounter  Procedures   [place] LIMITED ULTRASOUND INCLUDING AXILLA LEFT BREAST     Standing Status:   Future    Expected Date:   05/29/2023    Expiration Date:   05/21/2024    Reason for Exam (SYMPTOM  OR DIAGNOSIS REQUIRED):   left breast pain     Preferred [facility]    Release to patient:   Immediate   MM DIAG BREAST TOMO UNI LEFT    Standing Status:   Future    Expected Date:   05/29/2023    Expiration Date:   05/21/2024    Reason for Exam (SYMPTOM  OR DIAGNOSIS REQUIRED):   left breast pain    Is the patient pregnant?:   No    Preferred imaging location?:   [facility]    Release to patient:   Immediate   CBC with Differential (Cancer Center Only)    Standing Status:  Future    Number of Occurrences:   1    Expiration Date:   05/21/2024   Ferritin    Standing Status:   Future    Number of Occurrences:   1    Expiration Date:   05/21/2024   Iron and Iron Binding Capacity ([facility] only)    Standing Status:   Future    Number of Occurrences:   1    Expiration Date:   05/21/2024   Vitamin B12    Standing Status:   Future    Number of Occurrences:   1    Expiration Date:   05/21/2024   The patient has a good understanding of the overall plan. she agrees with it. she will call with any problems that may develop before the next visit here. Total time spent: 30 mins including face to face time and time spent for planning, charting and co-ordination of care   Tamsen Meek, MD 05/22/23

## 2023-05-24 ENCOUNTER — Ambulatory Visit
Admission: RE | Admit: 2023-05-24 | Discharge: 2023-05-24 | Disposition: A | Discharge status: Home / Self Care | Source: Ambulatory Visit | Attending: Hematology and Oncology | Admitting: Hematology and Oncology

## 2023-05-24 ENCOUNTER — Ambulatory Visit
Admission: RE | Admit: 2023-05-24 | Discharge: 2023-05-24 | Discharge status: Home / Self Care | Source: Ambulatory Visit | Attending: Hematology and Oncology

## 2023-05-24 DIAGNOSIS — N6092 Unspecified benign mammary dysplasia of left breast: Secondary | ICD-10-CM

## 2023-06-05 ENCOUNTER — Ambulatory Visit: Payer: 59 | Admitting: Dermatology

## 2023-07-13 ENCOUNTER — Ambulatory Visit: Payer: Self-pay | Admitting: Nurse Practitioner

## 2023-07-18 ENCOUNTER — Other Ambulatory Visit: Payer: Self-pay

## 2023-07-18 DIAGNOSIS — E785 Hyperlipidemia, unspecified: Secondary | ICD-10-CM

## 2023-07-18 DIAGNOSIS — I10 Essential (primary) hypertension: Secondary | ICD-10-CM

## 2023-07-18 MED ORDER — AMLODIPINE BESYLATE 5 MG PO TABS: 5.0000 mg | ORAL_TABLET | Freq: Every day | ORAL | 3 refills | Status: AC

## 2023-07-18 MED ORDER — ATORVASTATIN CALCIUM 40 MG PO TABS
ORAL_TABLET | ORAL | 1 refills | Status: DC
Start: 2023-07-18 — End: 2023-07-20

## 2023-07-20 ENCOUNTER — Ambulatory Visit (INDEPENDENT_AMBULATORY_CARE_PROVIDER_SITE_OTHER): Admitting: Nurse Practitioner

## 2023-07-20 ENCOUNTER — Encounter: Payer: Self-pay | Admitting: Nurse Practitioner

## 2023-07-20 VITALS — BP 135/57 | HR 64 | Temp 97.2°F | Ht 63.0 in | Wt 185.0 lb

## 2023-07-20 DIAGNOSIS — L819 Disorder of pigmentation, unspecified: Secondary | ICD-10-CM

## 2023-07-20 DIAGNOSIS — E785 Hyperlipidemia, unspecified: Secondary | ICD-10-CM

## 2023-07-20 DIAGNOSIS — M2550 Pain in unspecified joint: Secondary | ICD-10-CM | POA: Diagnosis not present

## 2023-07-20 MED ORDER — DICLOFENAC SODIUM 1 % EX GEL: 4.0000 g | Freq: Four times a day (QID) | CUTANEOUS | 0 refills | Status: DC

## 2023-07-20 MED ORDER — FERROUS GLUCONATE 324 (38 FE) MG PO TABS: 324.0000 mg | ORAL_TABLET | Freq: Two times a day (BID) | ORAL | 3 refills | Status: AC

## 2023-07-20 MED ORDER — VITAMIN B-12 1000 MCG PO TABS: 1000.0000 ug | ORAL_TABLET | Freq: Every day | ORAL | 1 refills | Status: AC

## 2023-07-20 MED ORDER — ATORVASTATIN CALCIUM 40 MG PO TABS
ORAL_TABLET | ORAL | 1 refills | Status: DC
Start: 2023-07-20 — End: 2023-10-20

## 2023-07-20 NOTE — Patient Instructions (Signed)

## 2023-07-20 NOTE — Progress Notes (Addendum)
 Subjective   Patient ID: Alexandra Beasley, female    DOB: 1965-09-16, 58 y.o.   MRN: [id number]  Chief Complaint  Patient presents with   Hypertension   Shoulder Pain    Left     Referring provider: Jerrlyn Morel, NP  Alexandra Beasley is a 58 y.o. 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 is in today for follow up for Hypertension. The current prescribed treatment is Amlodipine  5 mg. Patient is also on atorvastatin . Denies headache, dizziness, visual changes, chest pain, nausea, vomiting or any edema.   Patient does complain of some left shoulder pain.  This has been going on for the past couple weeks.  She thinks it might be due to the way she is sleeping.  She declines any medication at this time.  She does have Voltaren  gel as needed.  We did give her printout for shoulder exercises for her today.   Allergies  Allergen Reactions   Feraheme  [Ferumoxytol ] Anaphylaxis    Pt had allergic reaction to Feraheme  05/01/2021. See progress notes from 1210. Pt unable to complete infusion.     Immunization History  Administered Date(s) Administered   Influenza, Seasonal, Injecte, Preservative Fre 01/12/2023   Influenza,inj,Quad PF,6+ Mos 06/04/2015, 12/28/2015, 01/01/2018, 12/20/2018, 12/30/2019, 01/20/2021, 04/11/2022   PFIZER(Purple Top)SARS-COV-2 Vaccination 05/29/2019, 06/28/2019, 01/31/2020   Tdap 06/04/2015    Tobacco History: Social  History   Tobacco Use  Smoking Status Never  Smokeless Tobacco Never   Counseling given: Not Answered   Outpatient Encounter Medications as of 07/20/2023  Medication Sig   amLODipine  (NORVASC ) 5 MG tablet Take 1 tablet (5 mg total) by mouth daily.   Calcium  Carbonate-Vitamin D  (CALTRATE 600+D PO) Take by mouth.   Cholecalciferol (VITAMIN D3) 1000 units CAPS Take by mouth.   omeprazole  (PRILOSEC) 40 MG capsule Take 40 mg by mouth daily.   timolol (TIMOPTIC) 0.5 % ophthalmic solution 1 drop every morning.   Wheat Dextrin (BENEFIBER) POWD Take by mouth daily.   [DISCONTINUED] atorvastatin  (LIPITOR) 40 MG tablet TAKE 1 TABLET(40 MG) BY MOUTH DAILY AT 6 PM   [DISCONTINUED] cyanocobalamin  (VITAMIN B12) 1000 MCG tablet Take 1,000 mcg by mouth daily.   [DISCONTINUED] diclofenac  Sodium (VOLTAREN ) 1 % GEL Apply 4 g topically 4 (four) times daily.   [DISCONTINUED] ferrous gluconate  (FERGON) 324 MG tablet Take 1 tablet (324 mg total) by mouth 2 (two) times daily with a meal.   atorvastatin  (LIPITOR) 40 MG tablet TAKE 1 TABLET(40 MG) BY MOUTH DAILY AT 6 PM   cyanocobalamin  (VITAMIN B12) 1000 MCG tablet Take 1 tablet (1,000 mcg total) by mouth daily.   diclofenac  Sodium (VOLTAREN ) 1 % GEL Apply 4 g topically 4 (four) times daily.   ferrous gluconate  (FERGON) 324 MG tablet Take 1 tablet (324 mg total) by mouth 2 (two) times daily with a meal.   [DISCONTINUED] adapalene (DIFFERIN) 0.1 % gel Apply topically.   [DISCONTINUED] diclofenac  (VOLTAREN ) 75 MG EC tablet Take 1 tablet (75 mg total) by  mouth 2 (two) times daily. (Patient not taking: Reported on 05/22/2023)   [DISCONTINUED] diclofenac  Sodium (VOLTAREN ) 1 % GEL Apply 4 g topically 4 (four) times daily.   [DISCONTINUED] hydroquinone 4 % cream Apply to dark spots only of face once weekly as tolerated. (Patient not taking: Reported on 05/22/2023)   [DISCONTINUED] latanoprost (XALATAN) 0.005 % ophthalmic solution Place 1 drop into both eyes daily.    [DISCONTINUED] meloxicam  (MOBIC ) 15 MG tablet Take 15 mg by mouth daily.   [DISCONTINUED] timolol (BETIMOL) 0.5 % ophthalmic solution 1 drop once.   No facility-administered encounter medications on file as of 07/20/2023.    Review of Systems  Review of Systems  Constitutional: Negative.   HENT: Negative.    Cardiovascular: Negative.   Gastrointestinal: Negative.   Allergic/Immunologic: Negative.   Neurological: Negative.   Psychiatric/Behavioral: Negative.       Objective:   BP (!) 135/57   Pulse 64   Temp (!) 97.2 F (36.2 C)   Ht 5\' 3"  (1.6 m)   Wt 185 lb (83.9 kg)   LMP  (LMP Unknown)   SpO2 97%   BMI 32.77 kg/m   Wt Readings from Last 5 Encounters:  07/20/23 185 lb (83.9 kg)  01/12/23 191 lb (86.6 kg)  07/13/22 197 lb 9.6 oz (89.6 kg)  04/21/22 198 lb (89.8 kg)  04/11/22 196 lb 12.8 oz (89.3 kg)     Physical Exam Vitals and nursing note reviewed.  Constitutional:      General: She is not in acute distress.    Appearance: She is well-developed.  Cardiovascular:     Rate and Rhythm: Normal rate and regular rhythm.  Pulmonary:     Effort: Pulmonary effort is normal.     Breath sounds: Normal breath sounds.  Neurological:     Mental Status: She is alert and oriented to person, place, and time.       Assessment & Plan:   Discoloration of skin -     Ambulatory referral to Dermatology  Arthralgia, unspecified joint -     Diclofenac  Sodium; Apply 4 g topically 4 (four) times daily.  Dispense: 300 g; Refill: 0  Hyperlipidemia, unspecified hyperlipidemia type -     Atorvastatin  Calcium ; TAKE 1 TABLET(40 MG) BY MOUTH DAILY AT 6 PM  Dispense: 90 tablet; Refill: 1  Other orders -     Vitamin B-12; Take 1 tablet (1,000 mcg total) by mouth daily.  Dispense: 90 tablet; Refill: 1 -     Ferrous Gluconate ; Take 1 tablet (324 mg total) by mouth 2 (two) times daily with a meal.  Dispense: 60 tablet; Refill: 3     Return in about 6 months (around 01/20/2024).    Jerrlyn Morel, NP 07/20/2023

## 2023-08-21 ENCOUNTER — Inpatient Hospital Stay: Attending: Hematology and Oncology

## 2023-08-21 DIAGNOSIS — Z79899 Other long term (current) drug therapy: Secondary | ICD-10-CM | POA: Diagnosis not present

## 2023-08-21 DIAGNOSIS — E538 Deficiency of other specified B group vitamins: Secondary | ICD-10-CM | POA: Insufficient documentation

## 2023-08-21 DIAGNOSIS — D509 Iron deficiency anemia, unspecified: Secondary | ICD-10-CM | POA: Diagnosis present

## 2023-08-21 LAB — CBC WITH DIFFERENTIAL (CANCER CENTER ONLY)
Abs Immature Granulocytes: 0.03 10*3/uL (ref 0.00–0.07)
Basophils Absolute: 0.1 10*3/uL (ref 0.0–0.1)
Basophils Relative: 1 %
Eosinophils Absolute: 0.2 10*3/uL (ref 0.0–0.5)
Eosinophils Relative: 3 %
HCT: 37.3 % (ref 36.0–46.0)
Hemoglobin: 12 g/dL (ref 12.0–15.0)
Immature Granulocytes: 0 %
Lymphocytes Relative: 18 %
Lymphs Abs: 1.5 10*3/uL (ref 0.7–4.0)
MCH: 25.5 pg — ABNORMAL LOW (ref 26.0–34.0)
MCHC: 32.2 g/dL (ref 30.0–36.0)
MCV: 79.4 fL — ABNORMAL LOW (ref 80.0–100.0)
Monocytes Absolute: 0.7 10*3/uL (ref 0.1–1.0)
Monocytes Relative: 8 %
Neutro Abs: 6.1 10*3/uL (ref 1.7–7.7)
Neutrophils Relative %: 70 %
Platelet Count: 259 10*3/uL (ref 150–400)
RBC: 4.7 MIL/uL (ref 3.87–5.11)
RDW: 17 % — ABNORMAL HIGH (ref 11.5–15.5)
WBC Count: 8.6 10*3/uL (ref 4.0–10.5)
nRBC: 0 % (ref 0.0–0.2)

## 2023-08-21 LAB — IRON AND IRON BINDING CAPACITY (CC-WL,HP ONLY)
Iron: 35 ug/dL (ref 28–170)
Saturation Ratios: 9 % — ABNORMAL LOW (ref 10.4–31.8)
TIBC: 398 ug/dL (ref 250–450)
UIBC: 363 ug/dL (ref 148–442)

## 2023-08-21 LAB — FERRITIN: Ferritin: 37 ng/mL (ref 11–307)

## 2023-08-22 NOTE — Assessment & Plan Note (Signed)
 Previous reaction to IV iron (05/01/2021 to Feraheme ) Lab results: 04/21/2022: Hemoglobin 10.6, MCV 82.6, reticulocyte: 1.8%, iron saturation 9%, TIBC 452, ferritin 9 08/21/2023: Hemoglobin 12, MCV 79.4, iron saturation 9%, ferritin 37  Current treatment: Oral iron therapy I discussed with the patient that she is responding very nicely to the oral iron therapy and therefore we do not need to initiate IV iron treatment.  Recheck labs in 6 months and follow-up 2 days later with a telephone visit

## 2023-08-23 ENCOUNTER — Inpatient Hospital Stay: Admitting: Hematology and Oncology

## 2023-08-23 DIAGNOSIS — D509 Iron deficiency anemia, unspecified: Secondary | ICD-10-CM

## 2023-08-23 NOTE — Progress Notes (Signed)
 HEMATOLOGY-ONCOLOGY TELEPHONE VISIT PROGRESS NOTE  I connected with our patient on 08/23/23 at  8:30 AM EDT by telephone and verified that I am speaking with the correct person using two identifiers.  I discussed the limitations, risks, security and privacy concerns of performing an evaluation and management service by telephone and the availability of in person appointments.  I also discussed with the patient that there may be a patient responsible charge related to this service. The patient expressed understanding and agreed to proceed.   History of Present Illness: Follow-up of iron deficiency anemia  History of Present Illness Alexandra Beasley is a 58 year old female with iron deficiency anemia who presents for follow-up of her blood work.  Her hemoglobin level has improved to 12, which is within the normal range, and her ferritin level is 37, indicating adequate iron stores. She is currently taking iron supplements once daily and tolerates them well without any gastrointestinal issues. No symptoms related to anemia are present.   REVIEW OF SYSTEMS:   Constitutional: Denies fevers, chills or abnormal weight loss All other systems were reviewed with the patient and are negative. Observations/Objective:    Assessment Plan:  Iron deficiency anemia Previous reaction to IV iron (05/01/2021 to Feraheme ) Lab results: 04/21/2022: Hemoglobin 10.6, MCV 82.6, reticulocyte: 1.8%, iron saturation 9%, TIBC 452, ferritin 9 08/21/2023: Hemoglobin 12, MCV 79.4, iron saturation 9%, ferritin 37  Current treatment: Oral iron therapy I discussed with the patient that she is responding very nicely to the oral iron therapy and therefore we do not need to initiate IV iron treatment.  Recheck labs in 6 months and follow-up 2 days later with a telephone visit  Assessment & Plan Iron deficiency anemia Anemia improved. Hemoglobin normal at 12. Ferritin at 37. Tolerating iron well. - Continue iron  supplementation once daily. - Recheck iron levels in 6 months. - Ensure refill for iron supplementation.  B12 deficiency B12 levels previously high at 5000. Current supplementation appropriate. - Continue B12 supplementation every other day.      I discussed the assessment and treatment plan with the patient. The patient was provided an opportunity to ask questions and all were answered. The patient agreed with the plan and demonstrated an understanding of the instructions. The patient was advised to call back or seek an in-person evaluation if the symptoms worsen or if the condition fails to improve as anticipated.   I provided 20 minutes of non-face-to-face time during this encounter.  This includes time for charting and coordination of care   Margert Sheerer, MD

## 2023-10-09 ENCOUNTER — Other Ambulatory Visit: Payer: Self-pay | Admitting: Adult Health

## 2023-10-09 DIAGNOSIS — Z1231 Encounter for screening mammogram for malignant neoplasm of breast: Secondary | ICD-10-CM

## 2023-10-20 ENCOUNTER — Other Ambulatory Visit: Payer: Self-pay | Admitting: Nurse Practitioner

## 2023-10-20 DIAGNOSIS — E785 Hyperlipidemia, unspecified: Secondary | ICD-10-CM

## 2023-10-25 ENCOUNTER — Ambulatory Visit (INDEPENDENT_AMBULATORY_CARE_PROVIDER_SITE_OTHER): Payer: Self-pay | Admitting: Physician Assistant

## 2023-10-25 DIAGNOSIS — Z91199 Patient's noncompliance with other medical treatment and regimen due to unspecified reason: Secondary | ICD-10-CM

## 2023-10-25 NOTE — Progress Notes (Signed)
 Patient no-showed today's appointment.

## 2023-10-30 ENCOUNTER — Telehealth: Payer: Self-pay | Admitting: Nurse Practitioner

## 2023-10-30 NOTE — Telephone Encounter (Signed)
 Copied from CRM [id number]. Topic: Referral - Request for Referral >> Oct 30, 2023  3:59 PM Zebedee SAUNDERS wrote: Did the patient discuss referral with their provider in the last year? Yes (If No - schedule appointment) (If Yes - send message)  Appointment offered? Yes  Type of order/referral and detailed reason for visit: Orthopedics  Preference of office, provider, location: [facility] [address] [place], [place] [address] ph: [phone] fax: [phone]  If referral order, have you been seen by this specialty before? Yes 2 years ago (If Yes, this issue or another issue? When? Where?  Can we respond through MyChart? No

## 2023-11-01 ENCOUNTER — Ambulatory Visit
Admission: RE | Admit: 2023-11-01 | Discharge: 2023-11-01 | Disposition: A | Discharge status: Home / Self Care | Source: Ambulatory Visit | Attending: Adult Health | Admitting: Adult Health

## 2023-11-01 DIAGNOSIS — Z1231 Encounter for screening mammogram for malignant neoplasm of breast: Secondary | ICD-10-CM

## 2023-11-06 ENCOUNTER — Telehealth: Payer: Self-pay

## 2023-11-06 NOTE — Telephone Encounter (Signed)
 Patient have an appt. In November to see Tonya

## 2023-11-06 NOTE — Telephone Encounter (Signed)
 Copied from CRM #[id number]. Topic: Appointments - Appointment Scheduling >> Nov 06, 2023 10:13 AM Leonette SQUIBB wrote: Patient/patient representative is calling to schedule an appt.  She wants a referral to ortho but was going to see Bascom first but there are no appts until Sept.  Please advise  [phone]

## 2023-11-07 ENCOUNTER — Other Ambulatory Visit: Payer: Self-pay

## 2023-11-07 ENCOUNTER — Ambulatory Visit: Payer: Self-pay

## 2023-11-07 DIAGNOSIS — M2550 Pain in unspecified joint: Secondary | ICD-10-CM

## 2023-11-07 MED ORDER — DICLOFENAC SODIUM 1 % EX GEL: 4.0000 g | Freq: Four times a day (QID) | CUTANEOUS | 0 refills | Status: DC

## 2023-11-07 NOTE — Telephone Encounter (Signed)
 FYI Only or Action Required?: Action required by provider: referral to ortho - rx refill for voltaren .  Patient was last seen in primary care on 07/20/2023 by Oley Bascom RAMAN, NP.  Called Nurse Triage reporting No chief complaint on file..  Symptoms began several weeks ago.  Interventions attempted: Nothing.  Symptoms are: unchanged.  Triage Disposition: No disposition on file.  Patient/caregiver understands and will follow disposition?: Yes              Copied from CRM [id number]. Topic: Clinical - Red Word Triage >> Nov 07, 2023  9:55 AM Gustabo D wrote: Patient has been couple of weeks and can barely walk. Reason for Disposition . [1] MODERATE pain (e.g., interferes with normal activities, limping) AND [2] present > 3 days  Answer Assessment - Initial Assessment Questions 1. ONSET: When did the pain start?      3 weeks 2. LOCATION: Where is the pain located?      Left foot 3. PAIN: How bad is the pain?    (Scale 1-10; or mild, moderate, severe)     10/10 4. WORK OR EXERCISE: Has there been any recent work or exercise that involved this part of the body?      no 5. CAUSE: What do you think is causing the foot pain?     Plantar fasciitis 6. OTHER SYMPTOMS: Do you have any other symptoms? (e.g., leg pain, rash, fever, numbness)     no  Protocols used: Foot Pain-A-AH

## 2023-11-08 ENCOUNTER — Encounter: Payer: Self-pay | Admitting: Nurse Practitioner

## 2023-11-08 ENCOUNTER — Ambulatory Visit (INDEPENDENT_AMBULATORY_CARE_PROVIDER_SITE_OTHER): Admitting: Nurse Practitioner

## 2023-11-08 ENCOUNTER — Telehealth: Payer: Self-pay

## 2023-11-08 VITALS — BP 112/57 | HR 65 | Temp 97.9°F | Wt 184.0 lb

## 2023-11-08 DIAGNOSIS — M2550 Pain in unspecified joint: Secondary | ICD-10-CM | POA: Diagnosis not present

## 2023-11-08 DIAGNOSIS — M722 Plantar fascial fibromatosis: Secondary | ICD-10-CM

## 2023-11-08 DIAGNOSIS — M25572 Pain in left ankle and joints of left foot: Secondary | ICD-10-CM | POA: Diagnosis not present

## 2023-11-08 DIAGNOSIS — G8929 Other chronic pain: Secondary | ICD-10-CM

## 2023-11-08 MED ORDER — DICLOFENAC SODIUM 1 % EX GEL: 4.0000 g | Freq: Four times a day (QID) | CUTANEOUS | 0 refills | Status: DC

## 2023-11-08 MED ORDER — INDOMETHACIN 50 MG PO CAPS: 50.0000 mg | ORAL_CAPSULE | Freq: Three times a day (TID) | ORAL | 0 refills | Status: AC

## 2023-11-08 NOTE — Telephone Encounter (Signed)
 Patients son will call back with medicare # for IME(located on the red,white and blue card.

## 2023-11-08 NOTE — Patient Instructions (Signed)
 Low-Purine Diet A low-purine diet involves making choices to limit how much purine you take in. Purine is a kind of uric acid. If you have too much uric acid in your blood, it can cause problems like gout and kidney stones. Eating a low-purine diet can help control those problems. What are tips for following this plan? Shopping Avoid buying things that have high-fructose corn syrup in them. Check for this on food labels. Be sure to check for it in: [employer], such as cookies. Canned fruits. Cereals and cereal bars. Avoid buying meats with a high purine content. These include: Veal. Chicken breast with skin. Lamb. Organ meats, such as liver. Choose dairy products. These may lower uric acid levels. Avoid buying certain types of fish. These include: Anchovies. Trout. Tuna. Sardines. Salmon. Avoid buying drinks with alcohol in them. Alcohol can affect the way your body gets rid of uric acid. Stay away from: [facility]. Hard liquor. Meal planning  Learn which foods do or don't affect you. If you find a food that causes problems, avoid eating that food. If you have any questions about a food item, talk with your health care provider. Eat less meat. When you do eat meat, choose meats that have less purine in them. Eat lots of fruits and vegetables. Even though some vegetables have more purine in them, they don't add as much to the amount of uric acid in your blood as other foods. Have at least one serving of dairy a day. General information If you drink alcohol: Limit how much you have to: 0-1 drink a day if you're female. 0-2 drinks a day if you're female. Know how much alcohol is in your drink. In the [place] one drink is one 12 oz bottle of beer (355 mL), one 5 oz glass of wine (148 mL), or one 1 oz glass of hard liquor (44 mL). Drink more fluids as told. Fluids can help remove uric acid from your body. Work with your provider to make a plan to help you lose weight or stay at a healthy  weight. Losing weight can help lower how much uric acid is in your blood. What foods are recommended? You can have: Fresh or frozen fruits and vegetables. Whole grains, breads, cereals, and pasta. Rice. Beans, peas, and legumes. Nuts and seeds. Dairy products. Fats and oils. The items listed above may not be all the foods and drinks you can have. Talk with an expert in healthy eating called a dietitian to learn more. What foods are not recommended? Limit your intake of foods and drinks high in purines. These include: Beer and other types of alcohol. Meat-based gravy or sauce. Canned or fresh seafood, such as: Anchovies, sardines, herring, salmon, and tuna. Mussels, scallops, shrimp, and crab. Codfish, trout, and haddock. Bacon, veal, chicken breast with skin, and lamb. Organ meats, such as: Liver or kidney. Tripe. Sweetbreads (thymus gland or pancreas). [employer] officer, environmental. Yeast or yeast extract supplements. Drinks that have been made sweeter with high-fructose corn syrup. These include sodas. Processed foods made with high-fructose corn syrup. The items listed above may not be all the foods and drinks you should limit. Talk with a dietitian to learn more. This information is not intended to replace advice given to you by your health care provider. Make sure you discuss any questions you have with your health care provider. Document Revised: 06/10/2023 Document Reviewed: 06/10/2023 Elsevier Patient Education  2025 [employer].

## 2023-11-08 NOTE — Progress Notes (Signed)
 Subjective   Patient ID: Alexandra Beasley, female    DOB: 1965-09-06, 58 y.o.   MRN: [id number]  Chief Complaint  Patient presents with   Foot Swelling    Left foot swelling and pain x55month    Referring provider: Oley Alexandra RAMAN, NP  Alexandra Beasley is a 58 y.o. 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  She has been having left ankle pain and swelling for the past couple weeks.  She does have a history of plantar fasciitis and bone spurs to the left heel.  She does have heel and plantar region pain.  We discussed that the ankle swelling could be due to gout.  Patient is unable to get labs done today due to her son having to go back to work.  We will place a referral to podiatry. Denies f/c/s, n/v/d, hemoptysis, PND, leg swelling Denies chest pain or edema     Allergies  Allergen Reactions   Feraheme  [Ferumoxytol ] Anaphylaxis    Pt had allergic reaction to Feraheme  05/01/2021. See progress notes from 1210. Pt unable to complete infusion.     Immunization History  Administered Date(s) Administered   Influenza, Seasonal, Injecte, Preservative Fre 01/12/2023   Influenza,inj,Quad PF,6+ Mos 06/04/2015, 12/28/2015, 01/01/2018, 12/20/2018, 12/30/2019, 01/20/2021, 04/11/2022   PFIZER(Purple Top)SARS-COV-2 Vaccination 05/29/2019, 06/28/2019, 01/31/2020   Tdap 06/04/2015    Tobacco History: Social History    Tobacco Use  Smoking Status Never  Smokeless Tobacco Never   Counseling given: Not Answered   Outpatient Encounter Medications as of 11/08/2023  Medication Sig   amLODipine  (NORVASC ) 5 MG tablet Take 1 tablet (5 mg total) by mouth daily.   atorvastatin  (LIPITOR) 40 MG tablet TAKE 1 TABLET(40 MG) BY MOUTH DAILY AT 6 PM   Calcium  Carbonate-Vitamin D  (CALTRATE 600+D PO) Take by mouth.   Cholecalciferol (VITAMIN D3) 1000 units CAPS Take by mouth.   cyanocobalamin  (VITAMIN B12) 1000 MCG tablet Take 1 tablet (1,000 mcg total) by mouth daily.   ferrous gluconate  (FERGON) 324 MG tablet Take 1 tablet (324 mg total) by mouth 2 (two) times daily with a meal.   indomethacin  (INDOCIN ) 50 MG capsule Take 1 capsule (50 mg total) by mouth 3 (three) times daily with meals.   omeprazole  (PRILOSEC) 40 MG capsule Take 40 mg by mouth daily.   timolol (TIMOPTIC) 0.5 % ophthalmic solution 1 drop every morning.   Wheat Dextrin (BENEFIBER) POWD Take by mouth daily.   [DISCONTINUED] diclofenac  Sodium (VOLTAREN ) 1 % GEL Apply 4 g topically 4 (four) times daily.   diclofenac  Sodium (VOLTAREN ) 1 % GEL Apply 4 g topically 4 (four) times daily.   [DISCONTINUED] diclofenac  Sodium (VOLTAREN ) 1 % GEL Apply 4 g topically 4 (four) times daily.   No facility-administered encounter medications on file as of 11/08/2023.    Review of Systems  Review of Systems  Constitutional: Negative.   HENT: Negative.    Cardiovascular: Negative.   Gastrointestinal: Negative.   Allergic/Immunologic: Negative.  Neurological: Negative.   Psychiatric/Behavioral: Negative.       Objective:   BP (!) 112/57   Pulse 65   Temp 97.9 F (36.6 C) (Oral)   Wt 184 lb (83.5 kg)   LMP  (LMP Unknown)   SpO2 98%   BMI 32.59 kg/m   Wt Readings from Last 5 Encounters:  11/08/23 184 lb (83.5 kg)  07/20/23 185 lb (83.9 kg)  01/12/23 191 lb (86.6 kg)  07/13/22 197 lb 9.6 oz (89.6 kg)  04/21/22 198 lb (89.8 kg)     Physical  Exam Vitals and nursing note reviewed.  Constitutional:      General: She is not in acute distress.    Appearance: She is well-developed.  Cardiovascular:     Rate and Rhythm: Normal rate and regular rhythm.  Pulmonary:     Effort: Pulmonary effort is normal.     Breath sounds: Normal breath sounds.  Neurological:     Mental Status: She is alert and oriented to person, place, and time.       Assessment & Plan:   Plantar fasciitis -     Ambulatory referral to Podiatry  Arthralgia, unspecified joint -     Diclofenac  Sodium; Apply 4 g topically 4 (four) times daily.  Dispense: 300 g; Refill: 0  Chronic pain of left ankle -     Diclofenac  Sodium; Apply 4 g topically 4 (four) times daily.  Dispense: 300 g; Refill: 0 -     Ambulatory referral to Podiatry -     Indomethacin ; Take 1 capsule (50 mg total) by mouth 3 (three) times daily with meals.  Dispense: 30 capsule; Refill: 0     Return if symptoms worsen or fail to improve.   Alexandra GORMAN Borer, NP 11/08/2023

## 2023-11-09 ENCOUNTER — Ambulatory Visit (INDEPENDENT_AMBULATORY_CARE_PROVIDER_SITE_OTHER)

## 2023-11-09 ENCOUNTER — Encounter: Payer: Self-pay | Admitting: Podiatry

## 2023-11-09 ENCOUNTER — Ambulatory Visit (INDEPENDENT_AMBULATORY_CARE_PROVIDER_SITE_OTHER): Admitting: Podiatry

## 2023-11-09 DIAGNOSIS — M722 Plantar fascial fibromatosis: Secondary | ICD-10-CM

## 2023-11-09 DIAGNOSIS — M7732 Calcaneal spur, left foot: Secondary | ICD-10-CM

## 2023-11-09 DIAGNOSIS — M79672 Pain in left foot: Secondary | ICD-10-CM

## 2023-11-09 MED ORDER — TRIAMCINOLONE ACETONIDE 10 MG/ML IJ SUSP
10.0000 mg | Freq: Once | INTRAMUSCULAR | Status: AC
  Administered 2023-11-09: 10 mg

## 2023-11-09 NOTE — Progress Notes (Signed)
 Patient presents with a translator with a complaint of pain on the plantar aspect of the heel and its more to the lateral aspect of the heel than the medial.  Has not noticed any redness or swelling.  Hurts mostly when she first gets up in the morning or standing after sitting for a while.  Has not noticed any redness or ecchymosis.  No inciting incident noted.   Physical exam:  General appearance: Pleasant, and in no acute distress. AOx3.  Vascular: Pedal pulses: DP 2/4 bilaterally, PT 2/4 bilaterally.  Mild edema lower legs bilaterally. Capillary fill time immediate bilaterally.  Neurological: Light touch intact feet bilaterally.  Normal Achilles reflex bilaterally.  No clonus or spasticity noted.  Negative Tinel's sign tarsal tunnel and porta pedis bilaterally  Dermatologic:   Skin normal temperature bilaterally.  Skin normal color, tone, and texture bilaterally.   Musculoskeletal: Tenderness plantar lateral aspect of the heel at the lateral plantar calcaneal tubercle left.  No tenderness lateral compression of calcaneus.  Normal muscle strength lower extremity bilaterally  Radiographs: Radiographs 3 views left: Osteophytic changes plantar calcaneal tubercle.  No erosive changes noted.  No evidence of any fractures or dislocations.  Normal bone density.  No assenting bone tumors.  Diagnosis: 1.  Pain left foot. 2.  Plantar fasciitis left. 3.  Calcaneal spur left.  Plan: -New patient office visit for evaluation and management level 3.  Modifier 25. - Discussed the plan of fasciitis and etiology and treatment recommend an injection today.  Will also give her instructions for exercises she can do.  Discussed proper shoes to wear. -Gave written and oral exercise instructions. -injected 3cc 2:1 mixture 0.5 cc Marcaine :Kenolog 10mg /52ml at lateral origin plantar fascia of the left lateral plantar calcaneal tubercle..     Return 2 weeks follow-up injection plantar fascia left

## 2023-11-09 NOTE — Patient Instructions (Signed)

## 2023-11-23 ENCOUNTER — Ambulatory Visit: Payer: Self-pay

## 2023-11-23 NOTE — Telephone Encounter (Signed)
 Patient was already seen by [facility] for foot pain on 8/20, already seen foot doctor on 8/21, and has an upcoming appt. With Tonya for follow up

## 2023-11-23 NOTE — Telephone Encounter (Signed)
 FYI Only or Action Required?: Action required by provider: medication refill request.  Patient was last seen in primary care on 11/08/2023 by Oley Alexandra RAMAN, NP.  Called Nurse Triage reporting Medication Problem.  Triage Disposition: Call PCP When Office is Open  Patient/caregiver understands and will follow disposition?: Yes, will follow disposition  Copied from CRM [id number]. Topic: Clinical - Red Word Triage >> Nov 23, 2023 11:20 AM Alexandra Beasley wrote: Red Word that prompted transfer to Nurse Triage: Patient is reporting pain in her left foot. Patient son (Alexandra Beasley) is calling to see if provider can call in diclofenac  sodium topical gel 100 grams. Alexandra Oley called in diclofenac  Sodium (VOLTAREN ) 1 % GEL and [employer] will not cover bc its otc. The is gel is not working for the patient. Reason for Disposition  [1] Caller has NON-URGENT medicine question about med that PCP prescribed AND [2] triager unable to answer question  Answer Assessment - Initial Assessment Questions 1. NAME of MEDICINE: What medicine(s) are you calling about?     Voltaren , pt son states that the OTC version does not work as well. Pt requesting the rx version. Son states that insurance will not cover d/t the current rx being avail OTC. 2. QUESTION: What is your question? (e.g., double dose of medicine, side effect)     Pt son would like a new rx 3. PRESCRIBER: Who prescribed the medicine? Reason: if prescribed by specialist, call should be referred to that group.     PCP  Protocols used: Medication Question Call-A-AH

## 2023-11-23 NOTE — Telephone Encounter (Signed)
 Please call pt to schedule for foot pain. Thank you. KH

## 2023-12-06 ENCOUNTER — Ambulatory Visit: Payer: Self-pay | Admitting: Nurse Practitioner

## 2024-01-22 ENCOUNTER — Encounter: Payer: Self-pay | Admitting: Nurse Practitioner

## 2024-01-22 ENCOUNTER — Ambulatory Visit (INDEPENDENT_AMBULATORY_CARE_PROVIDER_SITE_OTHER): Payer: Self-pay | Admitting: Nurse Practitioner

## 2024-01-22 VITALS — BP 128/56 | HR 75 | Wt 190.2 lb

## 2024-01-22 DIAGNOSIS — M2550 Pain in unspecified joint: Secondary | ICD-10-CM | POA: Diagnosis not present

## 2024-01-22 DIAGNOSIS — Z13220 Encounter for screening for lipoid disorders: Secondary | ICD-10-CM | POA: Diagnosis not present

## 2024-01-22 DIAGNOSIS — I10 Essential (primary) hypertension: Secondary | ICD-10-CM

## 2024-01-22 DIAGNOSIS — M25572 Pain in left ankle and joints of left foot: Secondary | ICD-10-CM | POA: Diagnosis not present

## 2024-01-22 DIAGNOSIS — Z1329 Encounter for screening for other suspected endocrine disorder: Secondary | ICD-10-CM

## 2024-01-22 DIAGNOSIS — G8929 Other chronic pain: Secondary | ICD-10-CM

## 2024-01-22 MED ORDER — DICLOFENAC SODIUM 1 % EX GEL: 4.0000 g | Freq: Four times a day (QID) | CUTANEOUS | 0 refills | Status: DC

## 2024-01-22 NOTE — Progress Notes (Signed)
 Subjective   Patient ID: Alexandra Beasley, female    DOB: 18-Nov-1965, 58 y.o.   MRN: [id number]  Chief Complaint  Patient presents with   Foot Pain    Left foot, heel pain, started a couple days ago, has plantar fasia     Referring provider: Oley Bascom RAMAN, NP  Alexandra Beasley is a 58 y.o. 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  She does need labs today.  She does complain of left foot pain.  She does have a history of plantar fasciitis.  She does want a refill on diclofenac  cream.  She has seen podiatry for this in the past. Denies f/c/s, n/v/d, hemoptysis, PND, leg swelling Denies chest pain or edema    Allergies  Allergen Reactions   Feraheme  [Ferumoxytol ] Anaphylaxis    Pt had allergic reaction to Feraheme  05/01/2021. See progress notes from 1210. Pt unable to complete infusion.     Immunization History  Administered Date(s) Administered   Influenza, Seasonal, Injecte, Preservative Fre 01/12/2023   Influenza,inj,Quad PF,6+ Mos 06/04/2015, 12/28/2015, 01/01/2018, 12/20/2018, 12/30/2019, 01/20/2021, 04/11/2022   PFIZER(Purple Top)SARS-COV-2 Vaccination 05/29/2019, 06/28/2019, 01/31/2020   Tdap 06/04/2015    Tobacco History: Social History   Tobacco Use  Smoking Status Never  Smokeless Tobacco Never   Counseling given: Not Answered   Outpatient Encounter Medications as of 01/22/2024   Medication Sig   amLODipine  (NORVASC ) 5 MG tablet Take 1 tablet (5 mg total) by mouth daily.   atorvastatin  (LIPITOR) 40 MG tablet TAKE 1 TABLET(40 MG) BY MOUTH DAILY AT 6 PM   Calcium  Carbonate-Vitamin D  (CALTRATE 600+D PO) Take by mouth.   Cholecalciferol (VITAMIN D3) 1000 units CAPS Take by mouth.   cyanocobalamin  (VITAMIN B12) 1000 MCG tablet Take 1 tablet (1,000 mcg total) by mouth daily.   ferrous gluconate  (FERGON) 324 MG tablet Take 1 tablet (324 mg total) by mouth 2 (two) times daily with a meal.   indomethacin  (INDOCIN ) 50 MG capsule Take 1 capsule (50 mg total) by mouth 3 (three) times daily with meals.   omeprazole  (PRILOSEC) 40 MG capsule Take 40 mg by mouth daily.   timolol (TIMOPTIC) 0.5 % ophthalmic solution 1 drop every morning.   Wheat Dextrin (BENEFIBER) POWD Take by mouth daily.   [DISCONTINUED] diclofenac  Sodium (VOLTAREN ) 1 % GEL Apply 4 g topically 4 (four) times daily.   diclofenac  Sodium (VOLTAREN ) 1 % GEL Apply 4 g topically 4 (four) times daily.   No facility-administered encounter medications on file as of 01/22/2024.    Review of Systems  Review of Systems  Constitutional: Negative.   HENT: Negative.    Cardiovascular: Negative.   Gastrointestinal: Negative.   Allergic/Immunologic: Negative.   Neurological: Negative.   Psychiatric/Behavioral: Negative.       Objective:   BP (!) 128/56 (BP Location: Left Arm, Patient Position: Sitting, Cuff Size: Large)   Pulse 75   Wt 190 lb 3.2 oz (86.3 kg)   LMP  (LMP Unknown)  SpO2 96%   BMI 33.69 kg/m   Wt Readings from Last 5 Encounters:  01/22/24 190 lb 3.2 oz (86.3 kg)  11/08/23 184 lb (83.5 kg)  07/20/23 185 lb (83.9 kg)  01/12/23 191 lb (86.6 kg)  07/13/22 197 lb 9.6 oz (89.6 kg)     Physical Exam Vitals and nursing note reviewed.  Constitutional:      General: She is not in acute distress.    Appearance: She is well-developed.  Cardiovascular:     Rate and Rhythm: Normal rate and regular  rhythm.  Pulmonary:     Effort: Pulmonary effort is normal.     Breath sounds: Normal breath sounds.  Neurological:     Mental Status: She is alert and oriented to person, place, and time.       Assessment & Plan:   Thyroid  disorder screen -     TSH  Arthralgia, unspecified joint -     Diclofenac  Sodium; Apply 4 g topically 4 (four) times daily.  Dispense: 300 g; Refill: 0  Chronic pain of left ankle -     Diclofenac  Sodium; Apply 4 g topically 4 (four) times daily.  Dispense: 300 g; Refill: 0  Lipid screening -     Lipid panel  Essential hypertension -     CBC -     Comprehensive metabolic panel with GFR     Return in about 3 months (around 04/23/2024).   Bascom GORMAN Borer, NP 01/22/2024

## 2024-01-23 ENCOUNTER — Other Ambulatory Visit: Payer: Self-pay

## 2024-01-23 ENCOUNTER — Telehealth: Payer: Self-pay | Admitting: Nurse Practitioner

## 2024-01-23 DIAGNOSIS — M2550 Pain in unspecified joint: Secondary | ICD-10-CM

## 2024-01-23 DIAGNOSIS — G8929 Other chronic pain: Secondary | ICD-10-CM

## 2024-01-23 LAB — COMPREHENSIVE METABOLIC PANEL WITH GFR
ALT: 16 IU/L (ref 0–32)
AST: 23 IU/L (ref 0–40)
Albumin: 4 g/dL (ref 3.8–4.9)
Alkaline Phosphatase: 109 IU/L (ref 49–135)
BUN/Creatinine Ratio: 21 (ref 12–28)
BUN: 12 mg/dL (ref 8–27)
Bilirubin Total: 0.3 mg/dL (ref 0.0–1.2)
CO2: 21 mmol/L (ref 20–29)
Calcium: 9.6 mg/dL (ref 8.7–10.3)
Chloride: 104 mmol/L (ref 96–106)
Creatinine, Ser: 0.57 mg/dL (ref 0.57–1.00)
Globulin, Total: 3.1 g/dL (ref 1.5–4.5)
Glucose: 107 mg/dL — ABNORMAL HIGH (ref 70–99)
Potassium: 4 mmol/L (ref 3.5–5.2)
Sodium: 143 mmol/L (ref 134–144)
Total Protein: 7.1 g/dL (ref 6.0–8.5)
eGFR: 104 mL/min/1.73 (ref >59)

## 2024-01-23 LAB — LIPID PANEL
Chol/HDL Ratio: 3.2 ratio (ref 0.0–4.4)
Cholesterol, Total: 122 mg/dL (ref 100–199)
HDL: 38 mg/dL — ABNORMAL LOW (ref >39)
LDL Chol Calc (NIH): 66 mg/dL (ref 0–99)
Triglycerides: 96 mg/dL (ref 0–149)
VLDL Cholesterol Cal: 18 mg/dL (ref 5–40)

## 2024-01-23 LAB — CBC
Hematocrit: 39.9 % (ref 34.0–46.6)
Hemoglobin: 12.7 g/dL (ref 11.1–15.9)
MCH: 28.2 pg (ref 26.6–33.0)
MCHC: 31.8 g/dL (ref 31.5–35.7)
MCV: 89 fL (ref 79–97)
Platelets: 276 x10E3/uL (ref 150–450)
RBC: 4.51 x10E6/uL (ref 3.77–5.28)
RDW: 13.3 % (ref 11.7–15.4)
WBC: 8 x10E3/uL (ref 3.4–10.8)

## 2024-01-23 LAB — TSH: TSH: 1.52 u[IU]/mL (ref 0.450–4.500)

## 2024-01-23 NOTE — Telephone Encounter (Signed)
 Done sent to the provider. Kh

## 2024-01-23 NOTE — Telephone Encounter (Signed)
 Copied from CRM #[id number]. Topic: Clinical - Prescription Issue >> Jan 23, 2024 10:41 AM Avram MATSU wrote: Reason for CRM: [facility] is calling about her mothers medication. patient stated their still having issues with getting diclofenac  Sodium (VOLTAREN ) 1 % GEL [[id number]]. Please advise [phone]

## 2024-01-23 NOTE — Telephone Encounter (Signed)
 Please advise [facility]

## 2024-01-23 NOTE — Telephone Encounter (Signed)
 Daughter was called and request sent to the provider. Kh

## 2024-01-24 ENCOUNTER — Ambulatory Visit: Payer: Self-pay | Admitting: Nurse Practitioner

## 2024-01-24 MED ORDER — DICLOFENAC SODIUM 1 % EX GEL: 4.0000 g | Freq: Four times a day (QID) | CUTANEOUS | 0 refills | Status: AC

## 2024-01-29 ENCOUNTER — Telehealth: Payer: Self-pay

## 2024-01-29 NOTE — Telephone Encounter (Signed)
 Copied from CRM [id number]. Topic: Clinical - Medication Question >> Jan 29, 2024 10:16 AM Leonette SQUIBB wrote: Reason for CRM: patients son called saying the cream that was prescribed is still 30.00 with the insurance .  They can't pay that ans was told to call the provider.    Please advise [facility]

## 2024-02-12 ENCOUNTER — Telehealth: Payer: Self-pay

## 2024-02-12 NOTE — Telephone Encounter (Signed)
 omeprazole  (PRILOSEC) 40 MG capsule [[id number]]

## 2024-02-13 ENCOUNTER — Other Ambulatory Visit: Payer: Self-pay

## 2024-02-13 ENCOUNTER — Other Ambulatory Visit: Payer: Self-pay | Admitting: Nurse Practitioner

## 2024-02-13 ENCOUNTER — Telehealth: Payer: Self-pay

## 2024-02-13 MED ORDER — OMEPRAZOLE 40 MG PO CPDR: 40.0000 mg | DELAYED_RELEASE_CAPSULE | Freq: Every day | ORAL | 1 refills | Status: AC

## 2024-02-13 NOTE — Telephone Encounter (Signed)
 Done [facility]

## 2024-02-13 NOTE — Telephone Encounter (Signed)
 omeprazole  (PRILOSEC) 40 MG capsule [[id number]]

## 2024-02-14 ENCOUNTER — Telehealth: Payer: Self-pay | Admitting: Nurse Practitioner

## 2024-02-14 ENCOUNTER — Other Ambulatory Visit: Payer: Self-pay

## 2024-02-14 NOTE — Telephone Encounter (Signed)
 Pt son called and stated that the

## 2024-02-14 NOTE — Telephone Encounter (Signed)
 Copied from CRM [id number]. Topic: Clinical - Prescription Issue >> Feb 14, 2024 10:04 AM Nathanel BROCKS wrote: Reason for CRM:   Refill 01/23/2024 [facility]   [place], [place], [place] Arthralgia, unspecified joint +1 more Dx  All Conversations (Newest Message First)            Me [facility]   02/14/24 10:02 AM Incomplete Note Pt son called and stated that the Diclofenac  Sodium 4 g Topical 4 times daily is needing a prior authorization for it. The pharmacy said that the insurance will not cover it. Please advise.

## 2024-02-22 NOTE — Telephone Encounter (Signed)
 Per pt message the pain cream otc does not work for her. Please advise is there something else or will she need to be seen. KH

## 2024-02-26 ENCOUNTER — Inpatient Hospital Stay: Attending: Adult Health

## 2024-02-26 DIAGNOSIS — D509 Iron deficiency anemia, unspecified: Secondary | ICD-10-CM | POA: Insufficient documentation

## 2024-02-26 LAB — CBC WITH DIFFERENTIAL (CANCER CENTER ONLY)
Abs Immature Granulocytes: 0.03 K/uL (ref 0.00–0.07)
Basophils Absolute: 0.1 K/uL (ref 0.0–0.1)
Basophils Relative: 1 %
Eosinophils Absolute: 0.3 K/uL (ref 0.0–0.5)
Eosinophils Relative: 4 %
HCT: 37.7 % (ref 36.0–46.0)
Hemoglobin: 12.5 g/dL (ref 12.0–15.0)
Immature Granulocytes: 0 %
Lymphocytes Relative: 16 %
Lymphs Abs: 1.3 K/uL (ref 0.7–4.0)
MCH: 28.3 pg (ref 26.0–34.0)
MCHC: 33.2 g/dL (ref 30.0–36.0)
MCV: 85.3 fL (ref 80.0–100.0)
Monocytes Absolute: 0.6 K/uL (ref 0.1–1.0)
Monocytes Relative: 8 %
Neutro Abs: 6 K/uL (ref 1.7–7.7)
Neutrophils Relative %: 71 %
Platelet Count: 280 K/uL (ref 150–400)
RBC: 4.42 MIL/uL (ref 3.87–5.11)
RDW: 13.2 % (ref 11.5–15.5)
WBC Count: 8.3 K/uL (ref 4.0–10.5)
nRBC: 0 % (ref 0.0–0.2)

## 2024-02-26 LAB — IRON AND IRON BINDING CAPACITY (CC-WL,HP ONLY)
Iron: 53 ug/dL (ref 28–170)
Saturation Ratios: 15 % (ref 10.4–31.8)
TIBC: 351 ug/dL (ref 250–450)
UIBC: 298 ug/dL

## 2024-02-26 LAB — FERRITIN: Ferritin: 76 ng/mL (ref 11–307)

## 2024-02-28 ENCOUNTER — Inpatient Hospital Stay: Admitting: Hematology and Oncology

## 2024-02-28 DIAGNOSIS — D509 Iron deficiency anemia, unspecified: Secondary | ICD-10-CM | POA: Diagnosis not present

## 2024-02-28 NOTE — Progress Notes (Signed)
 HEMATOLOGY-ONCOLOGY TELEPHONE VISIT PROGRESS NOTE  I connected with our patient on 02/28/24 at 10:30 AM EST by telephone and verified that I am speaking with the correct person using two identifiers.  I discussed the limitations, risks, security and privacy concerns of performing an evaluation and management service by telephone and the availability of in person appointments.  I also discussed with the patient that there may be a patient responsible charge related to this service. The patient expressed understanding and agreed to proceed.   History of Present Illness: Telephone follow-up of iron deficiency anemia  History of Present Illness Alexandra Beasley is a 58 year old female with iron deficiency anemia who presents for routine hematology follow-up.  She reports no new symptoms. Recent labs show hemoglobin 12.5 g/dL, ferritin 76 ng/mL, and iron saturation 15%, which are acceptable for her.  She has not needed intravenous iron since an allergic reaction to liquid iron in 2023. She is managed with oral iron, which she tolerates without side effects, though her adherence is uncertain.    REVIEW OF SYSTEMS:   Constitutional: Denies fevers, chills or abnormal weight loss All other systems were reviewed with the patient and are negative. Observations/Objective:     Assessment Plan:  Iron deficiency anemia Previous reaction to IV iron (05/01/2021 to Feraheme ): Intolerance to IV iron Lab results: 04/21/2022: Hemoglobin 10.6, MCV 82.6, reticulocyte: 1.8%, iron saturation 9%, TIBC 452, ferritin 9 08/21/2023: Hemoglobin 12, MCV 79.4, iron saturation 9%, ferritin 37 02/26/2024: Hemoglobin 12.5, MCV 85.3, iron saturation 15%, ferritin 76   Current treatment: Oral iron therapy I discussed with the patient that she is responding very nicely to the oral iron therapy and therefore we do not need to initiate IV iron treatment.  Recheck labs in 1 year and follow-up 2 days later with a telephone  visit    I discussed the assessment and treatment plan with the patient. The patient was provided an opportunity to ask questions and all were answered. The patient agreed with the plan and demonstrated an understanding of the instructions. The patient was advised to call back or seek an in-person evaluation if the symptoms worsen or if the condition fails to improve as anticipated.   I provided 20 minutes of non-face-to-face time during this encounter.  This includes time for charting and coordination of care   Naomi MARLA Chad, MD

## 2024-02-28 NOTE — Assessment & Plan Note (Signed)
 Previous reaction to IV iron (05/01/2021 to Feraheme ) Lab results: 04/21/2022: Hemoglobin 10.6, MCV 82.6, reticulocyte: 1.8%, iron saturation 9%, TIBC 452, ferritin 9 08/21/2023: Hemoglobin 12, MCV 79.4, iron saturation 9%, ferritin 37 02/26/2024: Hemoglobin 12.5, MCV 85.3, iron saturation 15%, ferritin 76   Current treatment: Oral iron therapy I discussed with the patient that she is responding very nicely to the oral iron therapy and therefore we do not need to initiate IV iron treatment.  Recheck labs in 6 months and follow-up 2 days later with a telephone visit

## 2024-03-18 ENCOUNTER — Other Ambulatory Visit: Payer: Self-pay | Admitting: Nurse Practitioner

## 2024-03-18 DIAGNOSIS — E785 Hyperlipidemia, unspecified: Secondary | ICD-10-CM

## 2024-05-21 ENCOUNTER — Ambulatory Visit: Admitting: Adult Health

## 2024-05-21 ENCOUNTER — Other Ambulatory Visit

## 2024-07-26 ENCOUNTER — Ambulatory Visit: Payer: Self-pay | Admitting: Nurse Practitioner

## 2025-02-28 ENCOUNTER — Inpatient Hospital Stay

## 2025-03-03 ENCOUNTER — Inpatient Hospital Stay: Admitting: Hematology and Oncology
